# Patient Record
Sex: Male | Born: 1960
Health system: Southern US, Community
[De-identification: ages and names within clinical notes are randomized; demographics above are authoritative.]

## PROBLEM LIST (undated history)

## (undated) DIAGNOSIS — M549 Dorsalgia, unspecified: Secondary | ICD-10-CM

## (undated) DIAGNOSIS — J439 Emphysema, unspecified: Secondary | ICD-10-CM

## (undated) DIAGNOSIS — Z720 Tobacco use: Secondary | ICD-10-CM

## (undated) DIAGNOSIS — G8929 Other chronic pain: Secondary | ICD-10-CM

## (undated) HISTORY — PX: BACK SURGERY: SHX140

---

## 1998-09-23 ENCOUNTER — Encounter: Payer: Self-pay | Admitting: Emergency Medicine

## 1998-09-23 ENCOUNTER — Emergency Department (HOSPITAL_COMMUNITY): Admission: EM | Admit: 1998-09-23 | Discharge: 1998-09-23 | Payer: Self-pay | Admitting: Emergency Medicine

## 1999-04-10 ENCOUNTER — Emergency Department (HOSPITAL_COMMUNITY): Admission: EM | Admit: 1999-04-10 | Discharge: 1999-04-10 | Payer: Self-pay | Admitting: Emergency Medicine

## 1999-04-15 ENCOUNTER — Emergency Department (HOSPITAL_COMMUNITY): Admission: EM | Admit: 1999-04-15 | Discharge: 1999-04-15 | Payer: Self-pay | Admitting: Emergency Medicine

## 2000-11-25 ENCOUNTER — Encounter: Payer: Self-pay | Admitting: Family Medicine

## 2000-11-25 ENCOUNTER — Encounter: Admission: RE | Admit: 2000-11-25 | Discharge: 2000-11-25 | Payer: Self-pay

## 2004-12-26 ENCOUNTER — Emergency Department (HOSPITAL_COMMUNITY): Admission: EM | Admit: 2004-12-26 | Discharge: 2004-12-26 | Payer: Self-pay | Admitting: Family Medicine

## 2012-01-08 ENCOUNTER — Encounter (HOSPITAL_COMMUNITY): Payer: Self-pay

## 2012-01-08 ENCOUNTER — Emergency Department (HOSPITAL_COMMUNITY)
Admission: EM | Admit: 2012-01-08 | Discharge: 2012-01-09 | Disposition: A | Discharge status: Home Health | Payer: BC Managed Care – PPO | Attending: Emergency Medicine | Admitting: Emergency Medicine

## 2012-01-08 DIAGNOSIS — X58XXXA Exposure to other specified factors, initial encounter: Secondary | ICD-10-CM | POA: Insufficient documentation

## 2012-01-08 DIAGNOSIS — G8929 Other chronic pain: Secondary | ICD-10-CM | POA: Insufficient documentation

## 2012-01-08 DIAGNOSIS — F17200 Nicotine dependence, unspecified, uncomplicated: Secondary | ICD-10-CM | POA: Insufficient documentation

## 2012-01-08 DIAGNOSIS — S161XXA Strain of muscle, fascia and tendon at neck level, initial encounter: Secondary | ICD-10-CM

## 2012-01-08 DIAGNOSIS — M549 Dorsalgia, unspecified: Secondary | ICD-10-CM | POA: Insufficient documentation

## 2012-01-08 DIAGNOSIS — S139XXA Sprain of joints and ligaments of unspecified parts of neck, initial encounter: Secondary | ICD-10-CM | POA: Insufficient documentation

## 2012-01-08 HISTORY — DX: Other chronic pain: G89.29

## 2012-01-08 HISTORY — DX: Dorsalgia, unspecified: M54.9

## 2012-01-08 NOTE — ED Notes (Signed)
Pt reports intermittent posterior neck pain x8 months. Pt reports symptoms come and go. Pt reports he now is unable to move his head up/down or side to side starting 1900 this pm.

## 2012-01-09 ENCOUNTER — Emergency Department (HOSPITAL_COMMUNITY): Payer: BC Managed Care – PPO

## 2012-01-09 MED ORDER — HYDROMORPHONE HCL PF 1 MG/ML IJ SOLN
1.0000 mg | Freq: Once | INTRAMUSCULAR | Status: AC
  Administered 2012-01-09: 1 mg via INTRAMUSCULAR
  Filled 2012-01-09: qty 1

## 2012-01-09 MED ORDER — DIAZEPAM 5 MG PO TABS
5.0000 mg | ORAL_TABLET | Freq: Once | ORAL | Status: AC
  Administered 2012-01-09: 5 mg via ORAL
  Filled 2012-01-09: qty 1

## 2012-01-09 MED ORDER — KETOROLAC TROMETHAMINE 60 MG/2ML IM SOLN
60.0000 mg | Freq: Once | INTRAMUSCULAR | Status: AC
  Administered 2012-01-09: 60 mg via INTRAMUSCULAR
  Filled 2012-01-09: qty 2

## 2012-01-09 MED ORDER — DIAZEPAM 5 MG PO TABS: 5.0000 mg | ORAL_TABLET | Freq: Two times a day (BID) | ORAL | Status: DC

## 2012-01-09 MED ORDER — IBUPROFEN 800 MG PO TABS: 800.0000 mg | ORAL_TABLET | Freq: Three times a day (TID) | ORAL | Status: DC

## 2012-01-09 NOTE — ED Notes (Signed)
Pt discharged home in improved condition. 

## 2012-01-09 NOTE — ED Provider Notes (Signed)
Medical screening examination/treatment/procedure(s) were performed by non-physician practitioner and as supervising physician I was immediately available for consultation/collaboration.  Miron Marxen T Shatira Dobosz, MD 01/09/12 0745 

## 2012-01-09 NOTE — ED Provider Notes (Signed)
History     CSN: [id number]  Arrival date & time 01/08/12  2313   First MD Initiated Contact with Patient 01/09/12 0016      Chief Complaint  Patient presents with  . Neck Pain    (Consider location/radiation/quality/duration/timing/severity/associated sxs/prior treatment) HPI History provided by pt.   Pt has had intermittent pain in his posterior neck for the past 8 months.  Pain gradually worsened throughout the day yesterday and as of 7pm, he was unable to turn his head.  Pain does not radiate.  Associated w/ neck edema.  Denies fever, headache, extremity weakness/paresthesias, dizziness, vision changes, ataxia.  Has had mild improvement in pain w/ percocet that he takes for chronic low back pain.  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     Past Medical History  Diagnosis Date  . Chronic back pain     Past Surgical History  Procedure Date  . Back surgery     History reviewed. No pertinent family history.  History  Substance Use Topics  . Smoking status: Current Every Day Smoker  . Smokeless tobacco: Not on file  . Alcohol Use: Yes      Review of Systems  All other systems reviewed and are negative.    Allergies  Review of patient's allergies indicates no known allergies.  Home Medications   Current Outpatient Rx  Name Route Sig Dispense Refill  . OXYCODONE-ACETAMINOPHEN 10-325 MG PO TABS Oral Take 1 tablet by mouth every 6 (six) hours as needed. For pain    . VITAMIN C 500 MG PO TABS Oral Take 500 mg by mouth daily.      BP 157/96  Temp 98.3 F (36.8 C) (Oral)  Resp 18  SpO2 97%  Physical Exam  Nursing note and vitals reviewed. Constitutional: He is oriented to person, place, and time. He appears well-developed and well-nourished. No distress.  HENT:  Head: Normocephalic and atraumatic.  Eyes:       Normal appearance  Neck: Normal range of motion.  Pulmonary/Chest: Effort normal.  Musculoskeletal: Normal  range of motion.       No cervical spine tenderness.  Mild tenderness proximal bilateral trap.  Pain aggravated by passive ROM upper extremities and pt is unable to rotate head.  5/5 strength in all muscle groups upper extremities.  2+ radial pulses and distal sensation intact.  Ambulates w/out difficulty.     Neurological: He is alert and oriented to person, place, and time.  Psychiatric: He has a normal mood and affect. His behavior is normal.    ED Course  Procedures (including critical care time)  Labs Reviewed - No data to display Dg Cervical Spine Complete  01/09/2012  *RADIOLOGY REPORT*  Clinical Data: Neck pain for 8 months worsening tonight.  CERVICAL SPINE - COMPLETE 4+ VIEW  Comparison: None.  Findings: Vertebral body height and alignment are unremarkable. There is loss of disc space height worst at C5-6 and C6-7. Multilevel facet arthropathy is noted.  There is mild reversal of the normal cervical lordosis.  Prevertebral soft tissues appear normal.  Lung apices are clear.  IMPRESSION: No acute finding.  Degenerative disease most notable C5-6 and C6-7.   Original Report Authenticated By: Bernadene Bell. D'ALESSIO, M.D.      1. Cervical strain       MDM  51yo healthy M presents w/ acute on chronic neck pain.  No signs of infectious process and no NV deficits  on exam.  Suspect cervical strain.  Pt received IM dilaudid/toradol and po valium w/ relief of sx.  He is now able to rotate his head w/ ease.  Xray shows DJD.  Results discussed w/ pt.  Pt d/c'd home w/ valium and 800mg  ibuprofen.  Recommended heat and rest.  He has a neurosurgeon to follow up with.  Return precautions discussed.         Otilio Miu, [place] 01/09/12 [phone]

## 2013-06-02 ENCOUNTER — Other Ambulatory Visit: Payer: Self-pay | Admitting: Family Medicine

## 2013-06-02 ENCOUNTER — Ambulatory Visit
Admission: RE | Admit: 2013-06-02 | Discharge: 2013-06-02 | Disposition: A | Discharge status: Home / Self Care | Payer: BC Managed Care – PPO | Source: Ambulatory Visit | Attending: Family Medicine | Admitting: Family Medicine

## 2013-06-02 DIAGNOSIS — R109 Unspecified abdominal pain: Secondary | ICD-10-CM

## 2013-11-13 ENCOUNTER — Encounter: Payer: Self-pay | Admitting: Neurology

## 2013-11-13 ENCOUNTER — Ambulatory Visit (INDEPENDENT_AMBULATORY_CARE_PROVIDER_SITE_OTHER): Payer: BC Managed Care – PPO | Admitting: Neurology

## 2013-11-13 VITALS — BP 158/84 | HR 56 | Temp 97.1°F | Ht 69.0 in | Wt 134.0 lb

## 2013-11-13 DIAGNOSIS — R0609 Other forms of dyspnea: Secondary | ICD-10-CM

## 2013-11-13 DIAGNOSIS — G8929 Other chronic pain: Secondary | ICD-10-CM

## 2013-11-13 DIAGNOSIS — M549 Dorsalgia, unspecified: Secondary | ICD-10-CM

## 2013-11-13 DIAGNOSIS — R0683 Snoring: Secondary | ICD-10-CM

## 2013-11-13 DIAGNOSIS — R351 Nocturia: Secondary | ICD-10-CM

## 2013-11-13 DIAGNOSIS — F1120 Opioid dependence, uncomplicated: Secondary | ICD-10-CM

## 2013-11-13 DIAGNOSIS — R0989 Other specified symptoms and signs involving the circulatory and respiratory systems: Secondary | ICD-10-CM

## 2013-11-13 NOTE — Progress Notes (Signed)
Subjective:    Patient ID: Caleb Harper is a 53 y.o. male.  HPI    Star Age, MD, PhD [facility] [address] [place], [place] [address]  Dear Dr. Brien Few,   I saw your patient, Caleb Harper, upon your kind request in my neurologic clinic today for initial consultation of his sleep disorder, in particular, concern for underlying obstructive sleep disordered breathing. The patient is unaccompanied today. As you know, Caleb Harper with an underlying medical history of chronic low back pain, s/p 2 low back surgeries in the late 80s, on narcotic pain medication (Percocet qid, no later than 6 PM ), who reports snoring. He works in [employer].   His typical bedtime is reported to be around 9:30 PM and usual wake time is around 4 AM. Sleep onset typically occurs within 20 minutes. He reports feeling adequately rested upon awakening. He wakes up on an average 2 times in the middle of the night and has to go to the bathroom 2 times on a typical night. He denies morning headaches.  He denies frank excessive daytime somnolence (EDS) and His Epworth Sleepiness Score (ESS) is 8/24 today. He has not fallen asleep while driving. The patient has not been taking a scheduled nap.  He has been known to snore for the past few years. Snoring is reportedly mild, and he has not been told he has apneas. He sleeps alone and in the process of getting a divorce. He has no children and no pets. He drinks alcohol occasionally and is in the process of smoking reduction, now smoking about 8-10/day. The patient has not noted any RLS symptoms and is not known to kick while asleep or before falling asleep. There is no family history of RLS or OSA.  He denies cataplexy, sleep paralysis, hypnagogic or hypnopompic hallucinations, or sleep attacks. He does not report any vivid dreams, nightmares, dream enactments, or parasomnias, such as sleep talking  or sleep walking. The patient has not had a sleep study or a home sleep test.  He consumes 4 caffeinated beverages per day, usually in the form of 2 cups of coffee in the mornings, 2 sodas, and sometimes tea with dinner around 6 PM.  His bedroom is usually dark and cool. There is a TV in the bedroom and usually it is not on at night.   His Past Medical History Is Significant For: Past Medical History  Diagnosis Date  . Chronic back pain     His Past Surgical History Is Significant For: Past Surgical History  Procedure Laterality Date  . Back surgery      His Family History Is Significant For: Family History  Problem Relation Age of Onset  . Cancer Mother   . COPD Father     His Social History Is Significant For: History   Social History  . Marital Status: Single    Spouse Name: N/A    Number of Children: N/A  . Years of Education: N/A   Social History Main Topics  . Smoking status: Current Every Day Smoker  . Smokeless tobacco: None  . Alcohol Use: Yes  . Drug Use: No  . Sexual Activity:    Other Topics Concern  . None   Social History Narrative  . None    His Allergies Are:  No Known Allergies:   His Current Medications Are:  Outpatient Encounter Prescriptions as of 11/13/2013  Medication Sig  . oxyCODONE-acetaminophen (  PERCOCET) 10-325 MG per tablet Take 1 tablet by mouth every 6 (six) hours as needed. For pain  . ibuprofen (ADVIL,MOTRIN) 800 MG tablet Take 1 tablet (800 mg total) by mouth 3 (three) times daily.  . vitamin C (ASCORBIC ACID) 500 MG tablet Take 500 mg by mouth daily.  . [DISCONTINUED] diazepam (VALIUM) 5 MG tablet Take 1 tablet (5 mg total) by mouth 2 (two) times daily.  :  Review of Systems:  Out of a complete 14 point review of systems, all are reviewed and negative with the exception of these symptoms as listed below:   Review of Systems  Constitutional: Negative.   HENT: Negative.   Eyes: Negative.   Respiratory: Positive for cough.    Cardiovascular: Negative.   Gastrointestinal: Negative.   Endocrine: Negative.   Genitourinary: Negative.   Musculoskeletal: Negative.   Skin: Negative.   Allergic/Immunologic: Negative.   Neurological: Negative.   Hematological: Negative.   Psychiatric/Behavioral: Positive for sleep disturbance (snoring).    Objective:  Neurologic Exam  Physical Exam Physical Examination:   Filed Vitals:   11/13/13 1051  BP: 158/84  Pulse: 56  Temp: 97.1 F (36.2 C)    General Examination: The patient is a very pleasant 53 y.o. male in no acute distress. He appears well-developed and well-nourished and well groomed.   HEENT: Normocephalic, atraumatic, pupils are equal, round and reactive to light and accommodation. Funduscopic exam is normal with sharp disc margins noted. Extraocular tracking is good without limitation to gaze excursion or nystagmus noted. Normal smooth pursuit is noted. Hearing is grossly intact. Tympanic membranes are clear bilaterally. Face is symmetric with normal facial animation and normal facial sensation. Speech is clear with no dysarthria noted. There is no hypophonia. There is no lip, neck/head, jaw or voice tremor. Neck is supple with full range of passive and active motion. There are no carotid bruits on auscultation. Oropharynx exam reveals: mild mouth dryness, adequate dental hygiene and mild airway crowding, due to tonsils in place and mildly long tongue and uvula. Mallampati is class I. Tongue protrudes centrally and palate elevates symmetrically. Tonsils are 1+ in size. Neck size is 14 inches. He has a Mild overbite. Nasal inspection reveals no significant nasal mucosal bogginess or redness and no septal deviation.   Chest: Clear to auscultation without wheezing, rhonchi or crackles noted.  Heart: S1+S2+0, regular and normal without murmurs, rubs or gallops noted.   Abdomen: Soft, non-tender and non-distended with normal bowel sounds appreciated on  auscultation.  Extremities: There is no pitting edema in the distal lower extremities bilaterally. Pedal pulses are intact.  Skin: Warm and dry without trophic changes noted. There are no varicose veins.  Musculoskeletal: exam reveals no obvious joint deformities, tenderness or joint swelling or erythema.   Neurologically:  Mental status: The patient is awake, alert and oriented in all 4 spheres. His immediate and remote memory, attention, language skills and fund of knowledge are appropriate. There is no evidence of aphasia, agnosia, apraxia or anomia. Speech is clear with normal prosody and enunciation. Thought process is linear. Mood is normal and affect is normal.  Cranial nerves II - XII are as described above under HEENT exam. In addition: shoulder shrug is normal with equal shoulder height noted. Motor exam: Normal bulk, strength and tone is noted. There is no drift, tremor or rebound. Romberg is negative. Reflexes are 2+ throughout. Babinski: Toes are flexor bilaterally. Fine motor skills and coordination: intact with normal finger taps, normal hand movements, normal  rapid alternating patting, normal foot taps and normal foot agility.  Cerebellar testing: No dysmetria or intention tremor on finger to nose testing. Heel to shin is unremarkable bilaterally. There is no truncal or gait ataxia.  Sensory exam: intact to light touch, pinprick, vibration, temperature sense in the upper and lower extremities.  Gait, station and balance: He stands easily. No veering to one side is noted. No leaning to one side is noted. Posture is age-appropriate and stance is narrow based. Gait shows normal stride length and normal pace. No problems turning are noted. He turns en bloc. Tandem walk is unremarkable. Intact toe and heel stance is noted.                Assessment and Plan:   In summary, Caleb Harper is a very pleasant 53 y.o.-year old male with a history of chronic back pain on chronic narcotic pain  medication, who reports snoring, but otherwise with no telltale history or exam highly suspicous for OSA. Nevertheless, given his snoring and his narcotic pain medication use and nocturia reported, we should proceed with a sleep study. He would prefer a HST, which I will arrange and then see him back in follow up.  I had a long chat with the patient about my findings and the diagnosis of OSA and CSA, its prognosis and treatment options. We talked about medical treatments, surgical interventions and non-pharmacological approaches. I explained in particular the risks and ramifications of untreated moderate to severe OSA, especially with respect to developing cardiovascular disease down the Road, including congestive heart failure, difficult to treat hypertension, cardiac arrhythmias, or stroke. Patients on chronic narcotics sometimes have central apneas. I advised the patient not to drive when feeling sleepy.  I recommended the following at this time: home sleep study.  I answered all his questions today and the patient was in agreement. I would like to see him back after the sleep study is completed and encouraged him to call with any interim questions, concerns, problems or updates.   Thank you very much for allowing me to participate in the care of this nice patient. If I can be of any further assistance to you please do not hesitate to call me at [phone].  Sincerely,   Star Age, MD, PhD

## 2013-11-13 NOTE — Patient Instructions (Signed)
We will set up a home sleep test and take it from there. If you have obstructive or another form of sleep apnea, we will need to talk about treatment options.

## 2015-11-28 DIAGNOSIS — R030 Elevated blood-pressure reading, without diagnosis of hypertension: Secondary | ICD-10-CM | POA: Diagnosis not present

## 2015-11-28 DIAGNOSIS — J209 Acute bronchitis, unspecified: Secondary | ICD-10-CM | POA: Diagnosis not present

## 2016-05-15 DIAGNOSIS — R1013 Epigastric pain: Secondary | ICD-10-CM | POA: Diagnosis not present

## 2016-07-23 DIAGNOSIS — Z79899 Other long term (current) drug therapy: Secondary | ICD-10-CM | POA: Diagnosis not present

## 2016-07-23 DIAGNOSIS — M48061 Spinal stenosis, lumbar region without neurogenic claudication: Secondary | ICD-10-CM | POA: Diagnosis not present

## 2016-07-23 DIAGNOSIS — M5416 Radiculopathy, lumbar region: Secondary | ICD-10-CM | POA: Diagnosis not present

## 2016-07-23 DIAGNOSIS — M5126 Other intervertebral disc displacement, lumbar region: Secondary | ICD-10-CM | POA: Diagnosis not present

## 2016-09-26 DIAGNOSIS — M5416 Radiculopathy, lumbar region: Secondary | ICD-10-CM | POA: Diagnosis not present

## 2016-09-26 DIAGNOSIS — M48061 Spinal stenosis, lumbar region without neurogenic claudication: Secondary | ICD-10-CM | POA: Diagnosis not present

## 2016-09-26 DIAGNOSIS — R030 Elevated blood-pressure reading, without diagnosis of hypertension: Secondary | ICD-10-CM | POA: Diagnosis not present

## 2016-12-20 DIAGNOSIS — M5126 Other intervertebral disc displacement, lumbar region: Secondary | ICD-10-CM | POA: Diagnosis not present

## 2016-12-20 DIAGNOSIS — M5416 Radiculopathy, lumbar region: Secondary | ICD-10-CM | POA: Diagnosis not present

## 2016-12-20 DIAGNOSIS — M48061 Spinal stenosis, lumbar region without neurogenic claudication: Secondary | ICD-10-CM | POA: Diagnosis not present

## 2016-12-20 DIAGNOSIS — Z79899 Other long term (current) drug therapy: Secondary | ICD-10-CM | POA: Diagnosis not present

## 2017-01-07 ENCOUNTER — Other Ambulatory Visit: Payer: Self-pay | Admitting: Physical Medicine and Rehabilitation

## 2017-01-07 DIAGNOSIS — M48061 Spinal stenosis, lumbar region without neurogenic claudication: Secondary | ICD-10-CM

## 2017-01-19 ENCOUNTER — Ambulatory Visit
Admission: RE | Admit: 2017-01-19 | Discharge: 2017-01-19 | Disposition: A | Discharge status: Home / Self Care | Payer: BLUE CROSS/BLUE SHIELD | Source: Ambulatory Visit | Attending: Physical Medicine and Rehabilitation | Admitting: Physical Medicine and Rehabilitation

## 2017-01-19 DIAGNOSIS — M48061 Spinal stenosis, lumbar region without neurogenic claudication: Secondary | ICD-10-CM

## 2017-02-04 DIAGNOSIS — J209 Acute bronchitis, unspecified: Secondary | ICD-10-CM | POA: Diagnosis not present

## 2017-02-08 DIAGNOSIS — M4726 Other spondylosis with radiculopathy, lumbar region: Secondary | ICD-10-CM | POA: Diagnosis not present

## 2017-02-08 DIAGNOSIS — M48062 Spinal stenosis, lumbar region with neurogenic claudication: Secondary | ICD-10-CM | POA: Diagnosis not present

## 2017-02-08 DIAGNOSIS — M4727 Other spondylosis with radiculopathy, lumbosacral region: Secondary | ICD-10-CM | POA: Diagnosis not present

## 2017-02-08 DIAGNOSIS — M5116 Intervertebral disc disorders with radiculopathy, lumbar region: Secondary | ICD-10-CM | POA: Diagnosis not present

## 2017-02-08 DIAGNOSIS — M48061 Spinal stenosis, lumbar region without neurogenic claudication: Secondary | ICD-10-CM | POA: Diagnosis not present

## 2017-02-20 DIAGNOSIS — M48061 Spinal stenosis, lumbar region without neurogenic claudication: Secondary | ICD-10-CM | POA: Diagnosis not present

## 2017-02-20 DIAGNOSIS — M5416 Radiculopathy, lumbar region: Secondary | ICD-10-CM | POA: Diagnosis not present

## 2017-03-21 DIAGNOSIS — M5416 Radiculopathy, lumbar region: Secondary | ICD-10-CM | POA: Diagnosis not present

## 2017-03-21 DIAGNOSIS — M48061 Spinal stenosis, lumbar region without neurogenic claudication: Secondary | ICD-10-CM | POA: Diagnosis not present

## 2017-03-21 DIAGNOSIS — Z79899 Other long term (current) drug therapy: Secondary | ICD-10-CM | POA: Diagnosis not present

## 2017-03-21 DIAGNOSIS — F1120 Opioid dependence, uncomplicated: Secondary | ICD-10-CM | POA: Diagnosis not present

## 2017-06-17 DIAGNOSIS — M5416 Radiculopathy, lumbar region: Secondary | ICD-10-CM | POA: Diagnosis not present

## 2017-06-17 DIAGNOSIS — F1120 Opioid dependence, uncomplicated: Secondary | ICD-10-CM | POA: Diagnosis not present

## 2017-06-17 DIAGNOSIS — Z79899 Other long term (current) drug therapy: Secondary | ICD-10-CM | POA: Diagnosis not present

## 2017-06-17 DIAGNOSIS — M5126 Other intervertebral disc displacement, lumbar region: Secondary | ICD-10-CM | POA: Diagnosis not present

## 2017-07-22 ENCOUNTER — Ambulatory Visit
Admission: RE | Admit: 2017-07-22 | Discharge: 2017-07-22 | Disposition: A | Discharge status: Home / Self Care | Payer: BLUE CROSS/BLUE SHIELD | Source: Ambulatory Visit | Attending: Family Medicine | Admitting: Family Medicine

## 2017-07-22 ENCOUNTER — Other Ambulatory Visit: Payer: Self-pay | Admitting: Family Medicine

## 2017-07-22 DIAGNOSIS — R042 Hemoptysis: Secondary | ICD-10-CM

## 2017-09-12 DIAGNOSIS — M5416 Radiculopathy, lumbar region: Secondary | ICD-10-CM | POA: Diagnosis not present

## 2017-09-12 DIAGNOSIS — M5126 Other intervertebral disc displacement, lumbar region: Secondary | ICD-10-CM | POA: Diagnosis not present

## 2017-09-12 DIAGNOSIS — M48062 Spinal stenosis, lumbar region with neurogenic claudication: Secondary | ICD-10-CM | POA: Diagnosis not present

## 2017-09-12 DIAGNOSIS — Z79899 Other long term (current) drug therapy: Secondary | ICD-10-CM | POA: Diagnosis not present

## 2017-12-26 DIAGNOSIS — Z79899 Other long term (current) drug therapy: Secondary | ICD-10-CM | POA: Diagnosis not present

## 2017-12-26 DIAGNOSIS — M48061 Spinal stenosis, lumbar region without neurogenic claudication: Secondary | ICD-10-CM | POA: Diagnosis not present

## 2017-12-26 DIAGNOSIS — M5416 Radiculopathy, lumbar region: Secondary | ICD-10-CM | POA: Diagnosis not present

## 2017-12-26 DIAGNOSIS — M48062 Spinal stenosis, lumbar region with neurogenic claudication: Secondary | ICD-10-CM | POA: Diagnosis not present

## 2018-01-29 DIAGNOSIS — M5416 Radiculopathy, lumbar region: Secondary | ICD-10-CM | POA: Diagnosis not present

## 2018-02-04 DIAGNOSIS — J189 Pneumonia, unspecified organism: Secondary | ICD-10-CM | POA: Diagnosis not present

## 2018-02-11 DIAGNOSIS — I499 Cardiac arrhythmia, unspecified: Secondary | ICD-10-CM | POA: Diagnosis not present

## 2018-02-11 DIAGNOSIS — J189 Pneumonia, unspecified organism: Secondary | ICD-10-CM | POA: Diagnosis not present

## 2018-02-25 ENCOUNTER — Ambulatory Visit
Admission: RE | Admit: 2018-02-25 | Discharge: 2018-02-25 | Disposition: A | Discharge status: Home / Self Care | Payer: BLUE CROSS/BLUE SHIELD | Source: Ambulatory Visit | Attending: Family Medicine | Admitting: Family Medicine

## 2018-02-25 ENCOUNTER — Other Ambulatory Visit: Payer: Self-pay | Admitting: Family Medicine

## 2018-02-25 DIAGNOSIS — R05 Cough: Secondary | ICD-10-CM | POA: Diagnosis not present

## 2018-02-25 DIAGNOSIS — J069 Acute upper respiratory infection, unspecified: Secondary | ICD-10-CM

## 2018-03-12 ENCOUNTER — Other Ambulatory Visit: Payer: Self-pay | Admitting: Family Medicine

## 2018-03-12 DIAGNOSIS — J189 Pneumonia, unspecified organism: Secondary | ICD-10-CM

## 2018-03-18 ENCOUNTER — Ambulatory Visit
Admission: RE | Admit: 2018-03-18 | Discharge: 2018-03-18 | Disposition: A | Discharge status: Home / Self Care | Payer: BLUE CROSS/BLUE SHIELD | Source: Ambulatory Visit | Attending: Family Medicine | Admitting: Family Medicine

## 2018-03-18 DIAGNOSIS — J189 Pneumonia, unspecified organism: Secondary | ICD-10-CM | POA: Diagnosis not present

## 2018-03-18 MED ORDER — IOHEXOL 300 MG/ML  SOLN
75.0000 mL | Freq: Once | INTRAMUSCULAR | Status: AC | PRN
  Administered 2018-03-18: 75 mL via INTRAVENOUS

## 2018-03-19 ENCOUNTER — Encounter (HOSPITAL_COMMUNITY): Payer: Self-pay

## 2018-03-19 ENCOUNTER — Inpatient Hospital Stay (HOSPITAL_COMMUNITY)
Admission: EM | Admit: 2018-03-19 | Discharge: 2018-03-22 | DRG: 175 | Disposition: A | Discharge status: Home / Self Care | Payer: BLUE CROSS/BLUE SHIELD | Attending: Internal Medicine | Admitting: Internal Medicine

## 2018-03-19 ENCOUNTER — Other Ambulatory Visit: Payer: Self-pay

## 2018-03-19 DIAGNOSIS — I2693 Single subsegmental pulmonary embolism without acute cor pulmonale: Secondary | ICD-10-CM | POA: Diagnosis not present

## 2018-03-19 DIAGNOSIS — I5022 Chronic systolic (congestive) heart failure: Secondary | ICD-10-CM | POA: Diagnosis present

## 2018-03-19 DIAGNOSIS — I2699 Other pulmonary embolism without acute cor pulmonale: Secondary | ICD-10-CM | POA: Diagnosis not present

## 2018-03-19 DIAGNOSIS — R000 Tachycardia, unspecified: Secondary | ICD-10-CM | POA: Diagnosis not present

## 2018-03-19 DIAGNOSIS — M549 Dorsalgia, unspecified: Secondary | ICD-10-CM | POA: Diagnosis not present

## 2018-03-19 DIAGNOSIS — G8929 Other chronic pain: Secondary | ICD-10-CM | POA: Diagnosis not present

## 2018-03-19 DIAGNOSIS — J9601 Acute respiratory failure with hypoxia: Secondary | ICD-10-CM | POA: Diagnosis present

## 2018-03-19 DIAGNOSIS — J449 Chronic obstructive pulmonary disease, unspecified: Secondary | ICD-10-CM | POA: Diagnosis present

## 2018-03-19 DIAGNOSIS — R918 Other nonspecific abnormal finding of lung field: Secondary | ICD-10-CM | POA: Diagnosis not present

## 2018-03-19 DIAGNOSIS — M7989 Other specified soft tissue disorders: Secondary | ICD-10-CM | POA: Diagnosis not present

## 2018-03-19 DIAGNOSIS — F17210 Nicotine dependence, cigarettes, uncomplicated: Secondary | ICD-10-CM | POA: Diagnosis present

## 2018-03-19 DIAGNOSIS — R634 Abnormal weight loss: Secondary | ICD-10-CM | POA: Diagnosis not present

## 2018-03-19 DIAGNOSIS — I429 Cardiomyopathy, unspecified: Secondary | ICD-10-CM | POA: Diagnosis not present

## 2018-03-19 DIAGNOSIS — Z720 Tobacco use: Secondary | ICD-10-CM | POA: Diagnosis not present

## 2018-03-19 DIAGNOSIS — K219 Gastro-esophageal reflux disease without esophagitis: Secondary | ICD-10-CM | POA: Diagnosis not present

## 2018-03-19 DIAGNOSIS — J690 Pneumonitis due to inhalation of food and vomit: Secondary | ICD-10-CM | POA: Diagnosis not present

## 2018-03-19 DIAGNOSIS — M545 Low back pain: Secondary | ICD-10-CM | POA: Diagnosis not present

## 2018-03-19 DIAGNOSIS — I361 Nonrheumatic tricuspid (valve) insufficiency: Secondary | ICD-10-CM | POA: Diagnosis not present

## 2018-03-19 DIAGNOSIS — R911 Solitary pulmonary nodule: Secondary | ICD-10-CM | POA: Diagnosis not present

## 2018-03-19 HISTORY — DX: Emphysema, unspecified: J43.9

## 2018-03-19 HISTORY — DX: Tobacco use: Z72.0

## 2018-03-19 LAB — COMPREHENSIVE METABOLIC PANEL
ALK PHOS: 130 U/L — AB (ref 38–126)
ALT: 13 U/L (ref 0–44)
AST: 19 U/L (ref 15–41)
Albumin: 3.5 g/dL (ref 3.5–5.0)
Anion gap: 11 (ref 5–15)
BILIRUBIN TOTAL: 0.6 mg/dL (ref 0.3–1.2)
BUN: 12 mg/dL (ref 6–20)
CALCIUM: 9.8 mg/dL (ref 8.9–10.3)
CO2: 23 mmol/L (ref 22–32)
CREATININE: 1.03 mg/dL (ref 0.61–1.24)
Chloride: 102 mmol/L (ref 98–111)
GFR calc Af Amer: >60 mL/min (ref >60)
Glucose, Bld: 115 mg/dL — ABNORMAL HIGH (ref 70–99)
Potassium: 3.9 mmol/L (ref 3.5–5.1)
Sodium: 136 mmol/L (ref 135–145)
TOTAL PROTEIN: 7.6 g/dL (ref 6.5–8.1)

## 2018-03-19 LAB — CBC WITH DIFFERENTIAL/PLATELET
Abs Immature Granulocytes: 0.27 10*3/uL — ABNORMAL HIGH (ref 0.00–0.07)
Basophils Absolute: 0.1 10*3/uL (ref 0.0–0.1)
Basophils Relative: 1 %
EOS PCT: 0 %
Eosinophils Absolute: 0.1 10*3/uL (ref 0.0–0.5)
HEMATOCRIT: 46.2 % (ref 39.0–52.0)
HEMOGLOBIN: 14.7 g/dL (ref 13.0–17.0)
IMMATURE GRANULOCYTES: 2 %
LYMPHS ABS: 2.9 10*3/uL (ref 0.7–4.0)
Lymphocytes Relative: 19 %
MCH: 29.8 pg (ref 26.0–34.0)
MCHC: 31.8 g/dL (ref 30.0–36.0)
MCV: 93.5 fL (ref 80.0–100.0)
MONOS PCT: 9 %
Monocytes Absolute: 1.4 10*3/uL — ABNORMAL HIGH (ref 0.1–1.0)
NEUTROS PCT: 69 %
Neutro Abs: 10.2 10*3/uL — ABNORMAL HIGH (ref 1.7–7.7)
Platelets: 325 10*3/uL (ref 150–400)
RBC: 4.94 MIL/uL (ref 4.22–5.81)
RDW: 12.4 % (ref 11.5–15.5)
WBC: 15 10*3/uL — ABNORMAL HIGH (ref 4.0–10.5)
nRBC: 0 % (ref 0.0–0.2)

## 2018-03-19 LAB — HEPARIN LEVEL (UNFRACTIONATED): Heparin Unfractionated: 0.57 IU/mL (ref 0.30–0.70)

## 2018-03-19 LAB — TROPONIN I: Troponin I: <0.03 ng/mL (ref <0.03)

## 2018-03-19 LAB — BRAIN NATRIURETIC PEPTIDE: B NATRIURETIC PEPTIDE 5: 190.9 pg/mL — AB (ref 0.0–100.0)

## 2018-03-19 LAB — PROCALCITONIN: Procalcitonin: <0.1 ng/mL

## 2018-03-19 MED ORDER — ACETAMINOPHEN 325 MG PO TABS
650.0000 mg | ORAL_TABLET | Freq: Four times a day (QID) | ORAL | Status: DC | PRN
  Administered 2018-03-20: 650 mg via ORAL
  Filled 2018-03-19: qty 2

## 2018-03-19 MED ORDER — POLYETHYLENE GLYCOL 3350 17 G PO PACK: 17.0000 g | PACK | Freq: Every day | ORAL | Status: DC | PRN

## 2018-03-19 MED ORDER — OXYCODONE-ACETAMINOPHEN 10-325 MG PO TABS: 1.0000 | ORAL_TABLET | Freq: Four times a day (QID) | ORAL | Status: DC | PRN

## 2018-03-19 MED ORDER — ONDANSETRON HCL 4 MG PO TABS: 4.0000 mg | ORAL_TABLET | Freq: Four times a day (QID) | ORAL | Status: DC | PRN

## 2018-03-19 MED ORDER — VITAMIN C 500 MG PO TABS
500.0000 mg | ORAL_TABLET | Freq: Every day | ORAL | Status: DC
  Administered 2018-03-19 – 2018-03-22 (×4): 500 mg via ORAL
  Filled 2018-03-19 (×4): qty 1

## 2018-03-19 MED ORDER — HYDRALAZINE HCL 20 MG/ML IJ SOLN: 5.0000 mg | Freq: Four times a day (QID) | INTRAMUSCULAR | Status: DC | PRN

## 2018-03-19 MED ORDER — HEPARIN BOLUS VIA INFUSION
3500.0000 [IU] | Freq: Once | INTRAVENOUS | Status: AC
  Administered 2018-03-19: 3500 [IU] via INTRAVENOUS
  Filled 2018-03-19: qty 3500

## 2018-03-19 MED ORDER — OXYCODONE HCL 5 MG PO TABS
5.0000 mg | ORAL_TABLET | ORAL | Status: DC | PRN
  Administered 2018-03-19 – 2018-03-22 (×5): 5 mg via ORAL
  Filled 2018-03-19 (×6): qty 1

## 2018-03-19 MED ORDER — PANTOPRAZOLE SODIUM 40 MG PO TBEC
40.0000 mg | DELAYED_RELEASE_TABLET | Freq: Every day | ORAL | Status: DC
  Administered 2018-03-19 – 2018-03-22 (×4): 40 mg via ORAL
  Filled 2018-03-19 (×4): qty 1

## 2018-03-19 MED ORDER — METOPROLOL TARTRATE 25 MG PO TABS
25.0000 mg | ORAL_TABLET | Freq: Two times a day (BID) | ORAL | Status: DC
  Administered 2018-03-20 – 2018-03-21 (×2): 25 mg via ORAL
  Filled 2018-03-19 (×3): qty 1

## 2018-03-19 MED ORDER — HEPARIN (PORCINE) 25000 UT/250ML-% IV SOLN
1000.0000 [IU]/h | INTRAVENOUS | Status: DC
  Administered 2018-03-19 – 2018-03-21 (×3): 1000 [IU]/h via INTRAVENOUS
  Filled 2018-03-19 (×2): qty 250

## 2018-03-19 MED ORDER — ADULT MULTIVITAMIN W/MINERALS CH
1.0000 | ORAL_TABLET | Freq: Every day | ORAL | Status: DC
  Administered 2018-03-19 – 2018-03-22 (×4): 1 via ORAL
  Filled 2018-03-19 (×4): qty 1

## 2018-03-19 MED ORDER — ONDANSETRON HCL 4 MG/2ML IJ SOLN: 4.0000 mg | Freq: Four times a day (QID) | INTRAMUSCULAR | Status: DC | PRN

## 2018-03-19 MED ORDER — IPRATROPIUM-ALBUTEROL 0.5-2.5 (3) MG/3ML IN SOLN: 3.0000 mL | Freq: Four times a day (QID) | RESPIRATORY_TRACT | Status: DC | PRN

## 2018-03-19 MED ORDER — ACETAMINOPHEN 650 MG RE SUPP: 650.0000 mg | Freq: Four times a day (QID) | RECTAL | Status: DC | PRN

## 2018-03-19 NOTE — H&P (Signed)
History and Physical    DOA: 03/19/2018  PCP: [name], L.Marlou Sa, MD  Patient coming from: Home  Chief Complaint: Persistent cough and shortness of breath  HPI: Caleb Harper is a 57 y.o. male with no significant past medical history except for ruptured disc/chronic back pain for which he follows pain clinic and has been on Percocet 5 325 mg 4-5 times a day presents with persistent cough and shortness of breath since October.  Patient reports symptom onset in early October primarily experiencing dyspnea with dry cough for which he sought medical attention with his primary care doctor.  He was prescribed levofloxacin for 5 days for right lower lobe pneumonia with no improvement in symptoms.  He states dyspnea persisted and he also was producing clear phlegm.  PCP again prescribed doxycycline and prednisone for possible COPD exacerbation on October 22 with no improvement.  He was again evaluated on November 6 with a repeat chest x-ray showing persistent right lower lobe infiltrate for which he was prescribed Augmentin for 10 days. Patient presents today with complaints of worsening dyspnea, persistent cough, right-sided pleuritic chest pain and about 10 to 12 pounds weight loss over the last 2 months.  He denies hemoptysis or night sweats.  He denies any sick contacts.  He denies any nausea or vomiting.  He says his last cigarette was 3 weeks back.  He has been a smoker (one pack per day) for 30 years.  Occasionally drinks alcohol, denies drug abuse. Work-up in the ED revealed leukocytosis of 15,000, CT chest findings of subacute PE with multifocal infiltrates and right-sided pulmonary infarct.  Patient denies any long travels or sedentary state or family/personal history of blood clots. Per ED physician, O2 2 L was placed for comfort as patient appeared tachypneic talking full sentences on arrival with O2 sat 92% on room air.  Review of Systems: As per HPI otherwise 10 point review of systems negative.      Past Medical History:  Diagnosis Date  . Chronic back pain     Past Surgical History:  Procedure Laterality Date  . BACK SURGERY      Social history:  reports that he has been smoking. He does not have any smokeless tobacco history on file. He reports that he drinks alcohol. He reports that he does not use drugs.   No Known Allergies  Family History  Problem Relation Age of Onset  . Cancer Mother   . COPD Father       Prior to Admission medications   Medication Sig Start Date End Date Taking? Authorizing Provider  oxyCODONE-acetaminophen (PERCOCET/ROXICET) 5-325 MG tablet Take 1-2 tablets by mouth every 6 (six) hours as needed for moderate pain.    Yes [provider]  ibuprofen (ADVIL,MOTRIN) 800 MG tablet Take 1 tablet (800 mg total) by mouth 3 (three) times daily. Patient not taking: Reported on 03/19/2018 01/09/12   Gertha Calkin, PA-C    Physical Exam: Vitals:   03/19/18 1030 03/19/18 1100 03/19/18 1115 03/19/18 1130  BP: (!) 147/100 (!) 144/94 (!) 155/96 (!) 112/96  Pulse: 64 79 70 72  Resp: (!) 21 20 (!) 23 20  SpO2: 96% 96% 94% 95%  Weight:      Height:        Constitutional: NAD, calm, comfortable Vitals:   03/19/18 1030 03/19/18 1100 03/19/18 1115 03/19/18 1130  BP: (!) 147/100 (!) 144/94 (!) 155/96 (!) 112/96  Pulse: 64 79 70 72  Resp: (!) 21 20 (!) 23 20  SpO2: 96% 96% 94% 95%  Weight:      Height:       Eyes: PERRL, lids and conjunctivae normal ENMT: Mucous membranes are moist. Posterior pharynx clear of any exudate or lesions.Normal dentition.  Neck: normal, supple, no masses, no thyromegaly Respiratory: clear to auscultation bilaterally, no wheezing, no crackles. Normal respiratory effort. No accessory muscle use.  On 2 L nasal cannula Cardiovascular: Regular rate and rhythm, no murmurs / rubs / gallops. No extremity edema. 2+ pedal pulses. No carotid bruits.  Abdomen: no tenderness, no masses palpated. No  hepatosplenomegaly. Bowel sounds positive.  Musculoskeletal: no clubbing / cyanosis. No joint deformity upper and lower extremities. Good ROM, no contractures. Normal muscle tone.  Neurologic: CN 2-12 grossly intact. Sensation intact, DTR normal. Strength 5/5 in all 4.  Psychiatric: Normal judgment and insight. Alert and oriented x 3. Normal mood.  SKIN/catheters: no rashes, lesions, ulcers. No induration  Labs on Admission: I have personally reviewed following labs and imaging studies  CBC: Recent Labs  Lab 03/19/18 0946  WBC 15.0*  NEUTROABS 10.2*  HGB 14.7  HCT 46.2  MCV 93.5  PLT 366   Basic Metabolic Panel: Recent Labs  Lab 03/19/18 0946  NA 136  K 3.9  CL 102  CO2 23  GLUCOSE 115*  BUN 12  CREATININE 1.03  CALCIUM 9.8   GFR: Estimated Creatinine Clearance: 64.2 mL/min (by C-G formula based on SCr of 1.03 mg/dL). Liver Function Tests: Recent Labs  Lab 03/19/18 0946  AST 19  ALT 13  ALKPHOS 130*  BILITOT 0.6  PROT 7.6  ALBUMIN 3.5   No results for input(s): LIPASE, AMYLASE in the last 168 hours. No results for input(s): AMMONIA in the last 168 hours. Coagulation Profile: No results for input(s): INR, PROTIME in the last 168 hours. Cardiac Enzymes: Recent Labs  Lab 03/19/18 0946  TROPONINI <0.03   BNP (last 3 results) No results for input(s): PROBNP in the last 8760 hours. HbA1C: No results for input(s): HGBA1C in the last 72 hours. CBG: No results for input(s): GLUCAP in the last 168 hours. Lipid Profile: No results for input(s): CHOL, HDL, LDLCALC, TRIG, CHOLHDL, LDLDIRECT in the last 72 hours. Thyroid Function Tests: No results for input(s): TSH, T4TOTAL, FREET4, T3FREE, THYROIDAB in the last 72 hours. Anemia Panel: No results for input(s): VITAMINB12, FOLATE, FERRITIN, TIBC, IRON, RETICCTPCT in the last 72 hours. Urine analysis: No results found for: COLORURINE, APPEARANCEUR, LABSPEC, PHURINE, GLUCOSEU, HGBUR, BILIRUBINUR, KETONESUR,  PROTEINUR, UROBILINOGEN, NITRITE, LEUKOCYTESUR  Radiological Exams on Admission: Ct Chest W Contrast  Result Date: 03/19/2018 CLINICAL DATA:  57 year old male with a history of persisting pneumonia for 2 months EXAM: CT CHEST WITH CONTRAST TECHNIQUE: Multidetector CT imaging of the chest was performed during intravenous contrast administration. CONTRAST:  27mL OMNIPAQUE IOHEXOL 300 MG/ML  SOLN COMPARISON:  Chest x-ray 02/25/2018 07/22/2017 FINDINGS: Cardiovascular: Heart size within normal limits. The right ventricle/left ventricle ratio estimated less than 1.0. No pericardial fluid/thickening. No significant coronary calcifications. No aortic valve calcifications. Normal course caliber and contour of the thoracic aorta. Mild atherosclerotic changes. Branch vessels are patent. No periaortic fluid. Main pulmonary artery measures 2.9 cm. Filling defects within the segmental branches and subsegmental branches of the right lower lobe. Filling defects are mural and not central, with evidence of partial recanalization. There is associated hypoenhancing lung parenchyma at the right lung base. On the left no filling defects identified, however, there are regions of hypo attenuating/enhancing parenchyma at the left lung base.  Mediastinum/Nodes: Borderline enlarged lymph nodes of the mediastinum, predominantly of the lower paratracheal nodes and hilar nodes as well as subcarinal nodes. Index node on the right measures 13 mm at the hilum. AP window node measures 9 mm. Circumferential wall thickening of the distal esophagus. No endotracheal or central endobronchial debris. Unremarkable appearance of the thoracic inlet. Lungs/Pleura: Advanced centrilobular and paraseptal emphysema. Mixed consolidation and ground-glass opacity of the bilateral dependent lower lobes, more pronounced on the right complete collapse of the medial segments/posterior segment. Regions of hypoenhancement compatible with infarction. On the left  there are regions of hypoenhancement in the costophrenic sulcus posteriorly. Multiple nodules of the left upper lobe and right upper lobe, in the outer 1/3 of the lung. These nodular changes were not present on the x-ray of 07/22/2017. No pleural effusion. Upper Abdomen: No acute finding Musculoskeletal: No acute displaced fracture. Mild degenerative changes of the thoracic spine. IMPRESSION: CT demonstrates evidence multifocal infection as well as pulmonary infarction in the setting of subacute/developing chronic pulmonary embolism: -the nodular and ground-glass changes of the lungs, predominantly right lower lobe, favored to represent multifocal infection given that these changes were not present on the chest x-ray of July 22, 2017. Because rapidly evolving metastatic disease is not excluded, follow-up contrast-enhanced chest CT is recommended once the patient has been treated in 2-3 months. -superimposed pulmonary infarction of the right greater than left lower lobes with associated atelectasis/volume loss. Mural based pulmonary embolism of the right lower lobe segmental/subsegmental branches with partial recanalization, compatible with subacute to early chronic DVT. Circumferential distal esophageal thickening. This may be seen in the setting of longstanding GERD. Correlation with symptoms as well as potentially referral for upper endoscopy may be useful. Advanced emphysema.  Emphysema (ICD10-J43.9). Aortic Atherosclerosis (ICD10-I70.0). These results will be called to the ordering clinician or representative by the Radiologist Assistant, and communication documented in the PACS or zVision Dashboard. Electronically Signed   By: Corrie Mckusick D.O.   On: 03/19/2018 08:13    EKG: Independently reviewed.  Sinus tachycardia with PVCs and signs of atrial enlargement.     Assessment and Plan:   1.  Right lower lobe segmental/subsegmental pulmonary embolus: Patient started on heparin drip in the ED.  Will  continue the same.  Patient denies any precipitating factors like long travel or family or personal history of clots.  Will need hypercoagulable work-up including malignancy work-up at some point.  Obtain echo.  May have ventricular strain as BNP slightly elevated.  2.  Pulmonary infarct/multifocal infiltrates: Secondary to aspiration in the setting of chronic opiates and reflux (distal esophageal thickening reported on CT) versus malignancy.  Consulted pulmonary for further evaluation and recommendations, possibly bronch.  Incentive spirometry for atelectasis ordered.  PPI.  Although patient has leukocytosis, doubt infection as he completed 3 courses of antibiotics as outpatient including Levaquin, Augmentin and doxycycline.  Procalcitonin less than 0.1.  3.  Tobacco use/emphysema: Patient completed prednisone course in the last week of October.  No wheezing on my exam.  Will order nebulizer treatments for now.  Counseled regarding risks of tobacco use and advised to quit.  Patient stated he is already in the process to quit and his last cigarette was 3 weeks back.  4.  Chronic back pain: Resume chronic opiates  DVT prophylaxis: On heparin  Code Status: Full code  Family Communication: Discussed with patient. Health care proxy would be his brother Artyom Stencel Consults called: Pulmonary Admission status:  Patient admitted as inpatient as anticipated LOS  greater than 2 midnights    Guilford Shi MD [facility] Pager [phone]  If 7PM-7AM, please contact night-coverage [email or web address] Password [email or web address]  03/19/2018, 1:19 PM

## 2018-03-19 NOTE — ED Triage Notes (Signed)
Pt c/o SOB for about 2 months. Pt endorses chest pain. Pt had CT scan yesterday and MD office called and said he had a PE. Pt a&ox4.

## 2018-03-19 NOTE — Progress Notes (Signed)
ANTICOAGULATION CONSULT NOTE  Pharmacy Consult:  Heparin Indication: pulmonary embolus  No Known Allergies  Patient Measurements: Height: 5\' 9"  (175.3 cm) Weight: 125 lb (56.7 kg) IBW/kg (Calculated) : 70.7 Heparin Dosing Weight: 57 kg  Vital Signs: Temp: 98.1 F (36.7 C) (11/27 1530) BP: 157/98 (11/27 1530) Pulse Rate: 70 (11/27 1530)  Labs: Recent Labs    03/19/18 0946 03/19/18 1630  HGB 14.7  --   HCT 46.2  --   PLT 325  --   HEPARINUNFRC  --  0.57  CREATININE 1.03  --   TROPONINI <0.03  --     Estimated Creatinine Clearance: 64.2 mL/min (by C-G formula based on SCr of 1.03 mg/dL).    Assessment: 87 YOM presenting with CP/SOB, PE on CT and Pharmacy consulted to start heparin.  No AC PTA.  Heparin level is therapeutic; no bleeding reported.   Goal of Therapy:  Heparin level 0.3-0.7 units/ml Monitor platelets by anticoagulation protocol: Yes    Plan:  Continue heparin gtt at 1000 units/hr Check confirmatory heparin level   Ryker Pherigo D. Mina Marble, PharmD, BCPS, [place] 03/19/2018, 5:16 PM

## 2018-03-19 NOTE — Progress Notes (Signed)
Caleb Harper is a 57 y.o. male patient admitted from ED awake, alert - oriented  X 4 - no acute distress noted. IV in place, occlusive dsg intact without redness.  Orientation to room, and floor completed with information packet given to patient/family.  Patient declined safety video at this time.  Admission INP armband ID verified with patient/family, and in place.   SR up x 2, fall assessment complete, with patient and family able to verbalize understanding of risk associated with falls, and verbalized understanding to call nsg before up out of bed.  Call light within reach, patient able to voice, and demonstrate understanding.  Skin, clean-dry- intact without evidence of bruising, or skin tears.   No evidence of skin break down noted on exam.     Will cont to eval and treat per MD orders.  Tama High, RN 03/19/2018 3:43 PM

## 2018-03-19 NOTE — ED Provider Notes (Signed)
Emergency Department Provider Note   I have reviewed the triage vital signs and the nursing notes.   HISTORY  Chief Complaint Shortness of Breath   HPI Caleb Harper is a 57 y.o. male with animal medical problems but a significant history of smoking who presents the emergency department today with a known blood clot.  Sounds like the patient has had some increased fatigue, decreased appetite weight loss for last 2 to 3 months.  In the middle of October he went to urgent care because he had shortness of breath with a nonproductive cough no fever.  He was diagnosed with pneumonia started on antibiotics.  He followed up with his primary doctors and has been through 3 total rounds of antibiotics without any improvement.  He has significant dyspnea on exertion and intermittent chest pain.  No lower extremity or upper extremity swelling.  No recent injuries.  No recent surgeries.  No recent long trips or immobilization.  He saw his physician again yesterday and secondary to not improving with antibiotics did a CT scan which found a subacute to chronic pulmonary embolus but CT scan on my review also shows diffuse interstitial markings that were not present in April but were mentioned at the beginning of November prior to a couple rounds of antibiotics. No other associated or modifying symptoms.    Past Medical History:  Diagnosis Date  . Chronic back pain     There are no active problems to display for this patient.   Past Surgical History:  Procedure Laterality Date  . BACK SURGERY      Current Outpatient Rx  . Order #: [id number] Class: Print  . Order #: [id number] Class: Historical Med  . Order #: [id number] Class: Historical Med    Allergies Patient has no known allergies.  Family History  Problem Relation Age of Onset  . Cancer Mother   . COPD Father     Social History Social History   Tobacco Use  . Smoking status: Current Every Day Smoker  Substance Use Topics  . Alcohol  use: Yes  . Drug use: No    Review of Systems  All other systems negative except as documented in the HPI. All pertinent positives and negatives as reviewed in the HPI. ____________________________________________   PHYSICAL EXAM:  VITAL SIGNS: Vitals:   03/19/18 0939  Weight: 56.7 kg  Height: 5\' 9"  (1.753 m)   Constitutional: Alert and oriented. Well appearing and in no acute distress. Eyes: Conjunctivae are normal. PERRL. EOMI. Head: Atraumatic. Nose: No congestion/rhinnorhea. Mouth/Throat: Mucous membranes are moist.  Oropharynx non-erythematous. Neck: No stridor.  No meningeal signs.   Cardiovascular: Tachycardic rate, regular rhythm. Good peripheral circulation. Grossly normal heart sounds.   Respiratory: Tachypneic respiratory effort.  No retractions. Lungs diminished. Gastrointestinal: Soft and nontender. No distention.  Musculoskeletal: No lower extremity tenderness nor edema. No gross deformities of extremities. Neurologic:  Normal speech and language. No gross focal neurologic deficits are appreciated.  Skin:  Skin is warm, dry and intact. No rash noted.   ____________________________________________   LABS (all labs ordered are listed, but only abnormal results are displayed)  Labs Reviewed  CBC WITH DIFFERENTIAL/PLATELET  COMPREHENSIVE METABOLIC PANEL  BRAIN NATRIURETIC PEPTIDE  TROPONIN I  PROCALCITONIN  HEPARIN LEVEL (UNFRACTIONATED)   ____________________________________________  EKG   EKG Interpretation  Date/Time:  Wednesday March 19 2018 09:44:50 EST Ventricular Rate:  112 PR Interval:    QRS Duration: 99 QT Interval:  334 QTC Calculation: 431 R Axis:   -  22 Text Interpretation:  Sinus tachycardia Paired ventricular premature complexes Sinus pause with ventricular escape Biatrial enlargement Left ventricular hypertrophy Repol abnrm suggests ischemia, inferior leads ST elevation, consider lateral injury Baseline wander in lead(s) II III aVF   poor ecg, needs repeat Confirmed by Merrily Pew [phone]) on 03/19/2018 4:19:13 PM       ____________________________________________  RADIOLOGY  Ct Chest W Contrast  Result Date: 03/19/2018 CLINICAL DATA:  57 year old male with a history of persisting pneumonia for 2 months EXAM: CT CHEST WITH CONTRAST TECHNIQUE: Multidetector CT imaging of the chest was performed during intravenous contrast administration. CONTRAST:  41mL OMNIPAQUE IOHEXOL 300 MG/ML  SOLN COMPARISON:  Chest x-ray 02/25/2018 07/22/2017 FINDINGS: Cardiovascular: Heart size within normal limits. The right ventricle/left ventricle ratio estimated less than 1.0. No pericardial fluid/thickening. No significant coronary calcifications. No aortic valve calcifications. Normal course caliber and contour of the thoracic aorta. Mild atherosclerotic changes. Branch vessels are patent. No periaortic fluid. Main pulmonary artery measures 2.9 cm. Filling defects within the segmental branches and subsegmental branches of the right lower lobe. Filling defects are mural and not central, with evidence of partial recanalization. There is associated hypoenhancing lung parenchyma at the right lung base. On the left no filling defects identified, however, there are regions of hypo attenuating/enhancing parenchyma at the left lung base. Mediastinum/Nodes: Borderline enlarged lymph nodes of the mediastinum, predominantly of the lower paratracheal nodes and hilar nodes as well as subcarinal nodes. Index node on the right measures 13 mm at the hilum. AP window node measures 9 mm. Circumferential wall thickening of the distal esophagus. No endotracheal or central endobronchial debris. Unremarkable appearance of the thoracic inlet. Lungs/Pleura: Advanced centrilobular and paraseptal emphysema. Mixed consolidation and ground-glass opacity of the bilateral dependent lower lobes, more pronounced on the right complete collapse of the medial segments/posterior segment.  Regions of hypoenhancement compatible with infarction. On the left there are regions of hypoenhancement in the costophrenic sulcus posteriorly. Multiple nodules of the left upper lobe and right upper lobe, in the outer 1/3 of the lung. These nodular changes were not present on the x-ray of 07/22/2017. No pleural effusion. Upper Abdomen: No acute finding Musculoskeletal: No acute displaced fracture. Mild degenerative changes of the thoracic spine. IMPRESSION: CT demonstrates evidence multifocal infection as well as pulmonary infarction in the setting of subacute/developing chronic pulmonary embolism: -the nodular and ground-glass changes of the lungs, predominantly right lower lobe, favored to represent multifocal infection given that these changes were not present on the chest x-ray of July 22, 2017. Because rapidly evolving metastatic disease is not excluded, follow-up contrast-enhanced chest CT is recommended once the patient has been treated in 2-3 months. -superimposed pulmonary infarction of the right greater than left lower lobes with associated atelectasis/volume loss. Mural based pulmonary embolism of the right lower lobe segmental/subsegmental branches with partial recanalization, compatible with subacute to early chronic DVT. Circumferential distal esophageal thickening. This may be seen in the setting of longstanding GERD. Correlation with symptoms as well as potentially referral for upper endoscopy may be useful. Advanced emphysema.  Emphysema (ICD10-J43.9). Aortic Atherosclerosis (ICD10-I70.0). These results will be called to the ordering clinician or representative by the Radiologist Assistant, and communication documented in the PACS or zVision Dashboard. Electronically Signed   By: Corrie Mckusick D.O.   On: 03/19/2018 08:13    ____________________________________________   PROCEDURES  Procedure(s) performed:   Procedures  CRITICAL CARE Performed by: Merrily Pew Total critical care time:  35 minutes Critical care time was exclusive of  separately billable procedures and treating other patients. Critical care was necessary to treat or prevent imminent or life-threatening deterioration. Critical care was time spent personally by me on the following activities: development of treatment plan with patient and/or surrogate as well as nursing, discussions with consultants, evaluation of patient's response to treatment, examination of patient, obtaining history from patient or surrogate, ordering and performing treatments and interventions, ordering and review of laboratory studies, ordering and review of radiographic studies, pulse oximetry and re-evaluation of patient's condition.  ____________________________________________   INITIAL IMPRESSION / ASSESSMENT AND PLAN / ED COURSE  Based on history and CT scan read I suspect that he likely has a new lung cancer that his main risk factor for this new pleural embolus.  He is tachypneic, hypoxic to 92% at rest on room air with some intermittent chest pain.  Patient will need to be admitted for work-up and further management.  Heparin started.  We will hold on antibiotics unless it becomes absolutely evident that this multifocal process is pneumonia however once again I think is more likely malignancy.   Pertinent labs & imaging results that were available during my care of the patient were reviewed by me and considered in my medical decision making (see chart for details).  ____________________________________________  FINAL CLINICAL IMPRESSION(S) / ED DIAGNOSES  Final diagnoses:  Acute respiratory failure with hypoxia ([place])  Other acute pulmonary embolism, unspecified whether acute cor pulmonale present ([facility])     MEDICATIONS GIVEN DURING THIS VISIT:  Medications  heparin bolus via infusion 3,500 Units (has no administration in time range)  heparin ADULT infusion 100 units/mL (25000 units/269mL sodium chloride 0.45%) (has no  administration in time range)     NEW OUTPATIENT MEDICATIONS STARTED DURING THIS VISIT:  New Prescriptions   No medications on file    Note:  This note was prepared with assistance of Dragon voice recognition software. Occasional wrong-word or sound-a-like substitutions may have occurred due to the inherent limitations of voice recognition software.   Merrily Pew, MD 03/19/18 [phone]

## 2018-03-19 NOTE — Consult Note (Addendum)
NAME:  Caleb Harper, MRN:  [id number], DOB:  1960-06-21, LOS: 0 ADMISSION DATE:  03/19/2018, CONSULTATION DATE:   REFERRING MD:  Dr. Dayna Barker, CHIEF COMPLAINT:  PE    Brief History   57 y/o M, smoker admitted with outpatient CT chest showing PE, distal esophageal thickening and GGO.    History of present illness   57 y/o M, smoker, who presented to [facility] on 11/27 with reports of PE on outside CT scan.    The patient reported increased fatigue, decreased appetite and weight loss.  He had been experiencing symptoms for 2-3 months.  The patient was seen in the middle of October for non-productive cough & SOB.  The patient was diagnosed with pneumonia at that time and treated with antibiotics.  He followed with with his PCP and completed 3 rounds of antibiotics without improvement.  He developed increasing shortness of breath with exertion and intermittent chest pain.  He followed up with his PCP on 11/26 and was sent for CT imaging of the chest.    Outpatient CT of the chest with contrast demonstrated nodular & ground glass changes in the RLL, superimposed pulmonary infarction of R>L lower lobes with associated atelectasis / volume loss. Mural based pulmoanry embolism of the RLL segmental / subsegmental branches with partial recanalization compatible with subacute to early chronic DVT.  Circumferential distal esophageal thickening.  Advanced emphysema. The patient was referred for inpatient admit.  ER labs - Na 136, K 3.9, Cl 102, glucose 115, sr 1.03, alk phos 130, BNP 190, troponin <0.03, PCT <0.10,  WBC 15, Hgb 14.7 and platelets 325.    PCCM consulted for evaluation of infiltrates, PE.   Past Medical History  Back pain, tobacco abuse   Significant Hospital Events   11/27  Admit with PE   Consults:  PCCM 11/27  Procedures:    Significant Diagnostic Tests:  CT Chest with Contrast 11/26 >> nodular & ground glass changes in the RLL, superimposed pulmonary infarction of R>L lower lobes with  associated atelectasis / volume loss. Mural based pulmoanry embolism of the RLL segmental / subsegmental branches with partial recanalization compatible with subacute to early chronic DVT.  Circumferential distal esophageal thickening.  Advanced emphysema.    Micro Data:     Antimicrobials:     Interim history/subjective:     Objective   Blood pressure (!) 112/96, pulse 72, resp. rate 20, height 5' 9"  (1.753 m), weight 56.7 kg, SpO2 95 %.       No intake or output data in the 24 hours ending 03/19/18 1505 Filed Weights   03/19/18 0939  Weight: 56.7 kg    Examination: General: adult male lying on stretcher in NAD  HEENT: MM pink/moist, no JVD Neuro: AAOx4, speech clear, MAE  CV: s1s2 rrr, no m/r/g PULM: even/non-labored, lungs bilaterally clear TJ:QZES, non-tender, bsx4 active  Extremities: warm/dry, no edema  Skin: no rashes or lesions  Resolved Hospital Problem list      Assessment & Plan:   Pulmonary Embolism  -RLL segmental, subsegmental  -pt stable on RA with no respiratory distress  P: Heparin gtt per pharmacy / primary  Recommend transition to Eliquis when ready for PO for ~ 6 weeks therapy before interruption for biopsy  Await ECHO Troponin negative   Nodular Infiltrates & GGO Bilaterally -concern for underlying malignancy given admit with PE, smoking history, weight loss and imaging findings.  Negative PCT / low suspicion for PNA.  P: Will need outpatient PET scan  Consider IR biopsy of nodular infiltrate once PET imaging complete Follow up with Pulmonary as outpatient   Best practice:  Diet: As tolerated  Pain/Anxiety/Delirium protocol (if indicated): n/a VAP protocol (if indicated): n/a  DVT prophylaxis: per primary  GI prophylaxis: n/a  Glucose control: n/a  Mobility: As tolerated  Code Status: Full code  Family Communication: Patient updated on plan of care per Dr. Nelda Marseille.  Disposition: Medical floor.  PCCM will be available PRN.  Please call  back if new needs arise.   Labs   CBC: Recent Labs  Lab 03/19/18 0946  WBC 15.0*  NEUTROABS 10.2*  HGB 14.7  HCT 46.2  MCV 93.5  PLT 156    Basic Metabolic Panel: Recent Labs  Lab 03/19/18 0946  NA 136  K 3.9  CL 102  CO2 23  GLUCOSE 115*  BUN 12  CREATININE 1.03  CALCIUM 9.8   GFR: Estimated Creatinine Clearance: 64.2 mL/min (by C-G formula based on SCr of 1.03 mg/dL). Recent Labs  Lab 03/19/18 0946  PROCALCITON <0.10  WBC 15.0*    Liver Function Tests: Recent Labs  Lab 03/19/18 0946  AST 19  ALT 13  ALKPHOS 130*  BILITOT 0.6  PROT 7.6  ALBUMIN 3.5   No results for input(s): LIPASE, AMYLASE in the last 168 hours. No results for input(s): AMMONIA in the last 168 hours.  ABG No results found for: PHART, PCO2ART, PO2ART, HCO3, TCO2, ACIDBASEDEF, O2SAT   Coagulation Profile: No results for input(s): INR, PROTIME in the last 168 hours.  Cardiac Enzymes: Recent Labs  Lab 03/19/18 0946  TROPONINI <0.03    HbA1C: No results found for: HGBA1C  CBG: No results for input(s): GLUCAP in the last 168 hours.  Review of Systems: Positives in bold   Gen: Denies fever, chills, weight loss, fatigue, night sweats HEENT: Denies blurred vision, double vision, hearing loss, tinnitus, sinus congestion, rhinorrhea, sore throat, neck stiffness, dysphagia PULM: Denies shortness of breath, cough, sputum production, hemoptysis, wheezing CV: Denies chest pain, edema, orthopnea, paroxysmal nocturnal dyspnea, palpitations GI: Denies abdominal pain, nausea, vomiting, diarrhea, hematochezia, melena, constipation, change in bowel habits GU: Denies dysuria, hematuria, polyuria, oliguria, urethral discharge Endocrine: Denies hot or cold intolerance, polyuria, polyphagia or appetite change Derm: Denies rash, dry skin, scaling or peeling skin change Heme: Denies easy bruising, bleeding, bleeding gums Neuro: Denies headache, numbness, weakness, slurred speech, loss of memory  or consciousness   Past Medical History  He,  has a past medical history of Chronic back pain.   Surgical History    Past Surgical History:  Procedure Laterality Date  . BACK SURGERY       Social History   reports that he has been smoking. He does not have any smokeless tobacco history on file. He reports that he drinks alcohol. He reports that he does not use drugs.   Family History   His family history includes COPD in his father; Cancer in his mother.   Allergies No Known Allergies   Home Medications  Prior to Admission medications   Medication Sig Start Date End Date Taking? Authorizing Provider  oxyCODONE-acetaminophen (PERCOCET/ROXICET) 5-325 MG tablet Take 1-2 tablets by mouth every 6 (six) hours as needed for moderate pain.    Yes [provider]  ibuprofen (ADVIL,MOTRIN) 800 MG tablet Take 1 tablet (800 mg total) by mouth 3 (three) times daily. Patient not taking: Reported on 03/19/2018 01/09/12   Gertha Calkin, PA-C  Noe Gens, NP-C [facility] Pgr: [phone] or if no answer [phone] 03/19/2018, 3:05 PM  Attending Note:  57 year old male with tobacco abuse history who presents with none clearing pneumonia x3, PE and diffuse pulmonary nodules.  On exam, patient has clear lungs and does not seem hypoxemic.  I reviewed chest CT myself, RLL segmental PE and diffuse nodules namely LLL with infiltrate.  Discussed with PCCM-NP.  Infiltrate: concern for PNA but more concern for cancer  - Levofloxacin   - Vancomycin   - Pan culture  - PCT  - If PCT is negative would stop abx quickly  - F/U imaging in 6-8 wks  Pulmonary nodules:  - PET scan as outpatient  - F/U with CT guided biopsy after 6 wks (stabilization of the clot then can hold for biopsy)  - No role for bronchoscopy here  - May f/u as outpatient if needed  PE:  - Supplemental PE as needed  - Heparin  - Start oral NOAC per primary team  - Hold NOAC  for biopsy  PCCM will be available PRN  Patient seen and examined, agree with above note.  I dictated the care and orders written for this patient under my direction.  Rush Farmer, [place]

## 2018-03-19 NOTE — Progress Notes (Signed)
ANTICOAGULATION CONSULT NOTE - Initial Consult  Pharmacy Consult for heparin Indication: pulmonary embolus  No Known Allergies  Patient Measurements: Height: 5\' 9"  (175.3 cm) Weight: 125 lb (56.7 kg) IBW/kg (Calculated) : 70.7 Heparin Dosing Weight: 56.7kg  Vital Signs:    Labs: No results for input(s): HGB, HCT, PLT, APTT, LABPROT, INR, HEPARINUNFRC, HEPRLOWMOCWT, CREATININE, CKTOTAL, CKMB, TROPONINI in the last 72 hours.  CrCl cannot be calculated (No successful lab value found.).   Medical History: Past Medical History:  Diagnosis Date  . Chronic back pain     Medications:  Scheduled:   Assessment: 41 YOM presenting with CP/SOB, PE on CT and pharmacy consulted to start heparin.  No AC PTA.  Goal of Therapy:  Heparin level 0.3-0.7 units/ml Monitor platelets by anticoagulation protocol: Yes   Plan:  Heparin 3500 units IV x 1, then gtt at 1000 units/hr F/u 6 hour heparin level  Bertis Ruddy, PharmD Clinical Pharmacist Please check AMION for all [place] numbers 03/19/2018 10:08 AM

## 2018-03-19 NOTE — ED Notes (Signed)
ED TO INPATIENT HANDOFF REPORT  Name/Age/Gender Caleb Harper 57 y.o. male  Code Status    Code Status Orders  (From admission, onward)         Start     Ordered   03/19/18 1234  Full code  Continuous     03/19/18 1236        Code Status History    This patient has a current code status but no historical code status.      Home/SNF/Other Home  Chief Complaint SHOB; Blood Clot in Lung  Level of Care/Admitting Diagnosis ED Disposition    ED Disposition Condition [facility] Area: [place] [[id number]]  Level of Care: Telemetry [5]  Diagnosis: Acute pulmonary embolism [facility]) [[id number]]  Admitting Physician: Guilford Shi [[id number]]  Attending Physician: Guilford Shi [[id number]]  Estimated length of stay: 3 - 4 days  Certification:: I certify this patient will need inpatient services for at least 2 midnights  PT Class (Do Not Modify): Inpatient [101]  PT Acc Code (Do Not Modify): Private [1]       Medical History Past Medical History:  Diagnosis Date  . Chronic back pain     Allergies No Known Allergies  IV Location/Drains/Wounds Patient Lines/Drains/Airways Status   Active Line/Drains/Airways    Name:   Placement date:   Placement time:   Site:   Days:   Peripheral IV 03/19/18 Left Antecubital   03/19/18    0956    Antecubital   less than 1          Labs/Imaging Results for orders placed or performed during the hospital encounter of 03/19/18 (from the past 48 hour(s))  CBC with Differential     Status: Abnormal   Collection Time: 03/19/18  9:46 AM  Result Value Ref Range   WBC 15.0 (H) 4.0 - 10.5 K/uL   RBC 4.94 4.22 - 5.81 MIL/uL   Hemoglobin 14.7 13.0 - 17.0 g/dL   HCT 46.2 39.0 - 52.0 %   MCV 93.5 80.0 - 100.0 fL   MCH 29.8 26.0 - 34.0 pg   MCHC 31.8 30.0 - 36.0 g/dL   RDW 12.4 11.5 - 15.5 %   Platelets 325 150 - 400 K/uL   nRBC 0.0 0.0 - 0.2 %   Neutrophils Relative % 69 %   Neutro Abs 10.2 (H) 1.7 -  7.7 K/uL   Lymphocytes Relative 19 %   Lymphs Abs 2.9 0.7 - 4.0 K/uL   Monocytes Relative 9 %   Monocytes Absolute 1.4 (H) 0.1 - 1.0 K/uL   Eosinophils Relative 0 %   Eosinophils Absolute 0.1 0.0 - 0.5 K/uL   Basophils Relative 1 %   Basophils Absolute 0.1 0.0 - 0.1 K/uL   Immature Granulocytes 2 %   Abs Immature Granulocytes 0.27 (H) 0.00 - 0.07 K/uL    Comment: Performed at [facility], [address], [place], [place] [address]  Comprehensive metabolic panel     Status: Abnormal   Collection Time: 03/19/18  9:46 AM  Result Value Ref Range   Sodium 136 135 - 145 mmol/L   Potassium 3.9 3.5 - 5.1 mmol/L   Chloride 102 98 - 111 mmol/L   CO2 23 22 - 32 mmol/L   Glucose, Bld 115 (H) 70 - 99 mg/dL   BUN 12 6 - 20 mg/dL   Creatinine, Ser 1.03 0.61 - 1.24 mg/dL   Calcium 9.8 8.9 - 10.3 mg/dL  Total Protein 7.6 6.5 - 8.1 g/dL   Albumin 3.5 3.5 - 5.0 g/dL   AST 19 15 - 41 U/L   ALT 13 0 - 44 U/L   Alkaline Phosphatase 130 (H) 38 - 126 U/L   Total Bilirubin 0.6 0.3 - 1.2 mg/dL   GFR calc non Af Amer >60 >60 mL/min   GFR calc Af Amer >60 >60 mL/min   Anion gap 11 5 - 15    Comment: Performed at [facility] [address] [place], [place] [address]  Brain natriuretic peptide     Status: Abnormal   Collection Time: 03/19/18  9:46 AM  Result Value Ref Range   B Natriuretic Peptide 190.9 (H) 0.0 - 100.0 pg/mL    Comment: Performed at [facility] [address], [place], [place] [address]  Troponin I - ONCE - STAT     Status: None   Collection Time: 03/19/18  9:46 AM  Result Value Ref Range   Troponin I <0.03 <0.03 ng/mL    Comment: Performed at [facility] [address] [place], [place] [address]  Procalcitonin     Status: None   Collection Time: 03/19/18  9:46 AM  Result Value Ref Range   Procalcitonin <0.10 ng/mL    Comment:        Interpretation: PCT (Procalcitonin) <= 0.5 ng/mL: Systemic infection (sepsis) is not likely. Local bacterial  infection is possible. (NOTE)       Sepsis PCT Algorithm           Lower Respiratory Tract                                      Infection PCT Algorithm    ----------------------------     ----------------------------         PCT < 0.25 ng/mL                PCT < 0.10 ng/mL         Strongly encourage             Strongly discourage   discontinuation of antibiotics    initiation of antibiotics    ----------------------------     -----------------------------       PCT 0.25 - 0.50 ng/mL            PCT 0.10 - 0.25 ng/mL               OR       >80% decrease in PCT            Discourage initiation of                                            antibiotics      Encourage discontinuation           of antibiotics    ----------------------------     -----------------------------         PCT >= 0.50 ng/mL              PCT 0.26 - 0.50 ng/mL               AND        <80% decrease in PCT  Encourage initiation of                                             antibiotics       Encourage continuation           of antibiotics    ----------------------------     -----------------------------        PCT >= 0.50 ng/mL                  PCT > 0.50 ng/mL               AND         increase in PCT                  Strongly encourage                                      initiation of antibiotics    Strongly encourage escalation           of antibiotics                                     -----------------------------                                           PCT <= 0.25 ng/mL                                                 OR                                        > 80% decrease in PCT                                     Discontinue / Do not initiate                                             antibiotics Performed at [facility], [address], [place], [place] [address]    Ct Chest W Contrast  Result Date: 03/19/2018 CLINICAL DATA:  57 year old male with a history of persisting  pneumonia for 2 months EXAM: CT CHEST WITH CONTRAST TECHNIQUE: Multidetector CT imaging of the chest was performed during intravenous contrast administration. CONTRAST:  64mL OMNIPAQUE IOHEXOL 300 MG/ML  SOLN COMPARISON:  Chest x-ray 02/25/2018 07/22/2017 FINDINGS: Cardiovascular: Heart size within normal limits. The right ventricle/left ventricle ratio estimated less than 1.0. No pericardial fluid/thickening. No significant coronary calcifications. No aortic valve calcifications. Normal course caliber and contour of the thoracic aorta. Mild atherosclerotic changes. Branch vessels are patent. No periaortic fluid. Main pulmonary artery measures 2.9 cm. Filling defects  within the segmental branches and subsegmental branches of the right lower lobe. Filling defects are mural and not central, with evidence of partial recanalization. There is associated hypoenhancing lung parenchyma at the right lung base. On the left no filling defects identified, however, there are regions of hypo attenuating/enhancing parenchyma at the left lung base. Mediastinum/Nodes: Borderline enlarged lymph nodes of the mediastinum, predominantly of the lower paratracheal nodes and hilar nodes as well as subcarinal nodes. Index node on the right measures 13 mm at the hilum. AP window node measures 9 mm. Circumferential wall thickening of the distal esophagus. No endotracheal or central endobronchial debris. Unremarkable appearance of the thoracic inlet. Lungs/Pleura: Advanced centrilobular and paraseptal emphysema. Mixed consolidation and ground-glass opacity of the bilateral dependent lower lobes, more pronounced on the right complete collapse of the medial segments/posterior segment. Regions of hypoenhancement compatible with infarction. On the left there are regions of hypoenhancement in the costophrenic sulcus posteriorly. Multiple nodules of the left upper lobe and right upper lobe, in the outer 1/3 of the lung. These nodular changes were  not present on the x-ray of 07/22/2017. No pleural effusion. Upper Abdomen: No acute finding Musculoskeletal: No acute displaced fracture. Mild degenerative changes of the thoracic spine. IMPRESSION: CT demonstrates evidence multifocal infection as well as pulmonary infarction in the setting of subacute/developing chronic pulmonary embolism: -the nodular and ground-glass changes of the lungs, predominantly right lower lobe, favored to represent multifocal infection given that these changes were not present on the chest x-ray of July 22, 2017. Because rapidly evolving metastatic disease is not excluded, follow-up contrast-enhanced chest CT is recommended once the patient has been treated in 2-3 months. -superimposed pulmonary infarction of the right greater than left lower lobes with associated atelectasis/volume loss. Mural based pulmonary embolism of the right lower lobe segmental/subsegmental branches with partial recanalization, compatible with subacute to early chronic DVT. Circumferential distal esophageal thickening. This may be seen in the setting of longstanding GERD. Correlation with symptoms as well as potentially referral for upper endoscopy may be useful. Advanced emphysema.  Emphysema (ICD10-J43.9). Aortic Atherosclerosis (ICD10-I70.0). These results will be called to the ordering clinician or representative by the Radiologist Assistant, and communication documented in the PACS or zVision Dashboard. Electronically Signed   By: Corrie Mckusick D.O.   On: 03/19/2018 08:13    Pending Labs Unresulted Labs (From admission, onward)    Start     Ordered   03/20/18 0500  Heparin level (unfractionated)  Daily,   R     03/19/18 1010   03/20/18 0500  CBC  Daily,   R     03/19/18 1010   03/20/18 3212  Basic metabolic panel  Tomorrow morning,   R     03/19/18 1236   03/20/18 0500  CBC  Tomorrow morning,   R     03/19/18 1236   03/19/18 1600  Heparin level (unfractionated)  Once-Timed,   R     03/19/18  1010   03/19/18 1233  HIV antibody (Routine Testing)  Once,   R     03/19/18 1236          Vitals/Pain Today's Vitals   03/19/18 1030 03/19/18 1100 03/19/18 1115 03/19/18 1130  BP: (!) 147/100 (!) 144/94 (!) 155/96 (!) 112/96  Pulse: 64 79 70 72  Resp: (!) 21 20 (!) 23 20  SpO2: 96% 96% 94% 95%  Weight:      Height:      PainSc:  Isolation Precautions No active isolations  Medications Medications  heparin ADULT infusion 100 units/mL (25000 units/298mL sodium chloride 0.45%) (1,000 Units/hr Intravenous New Bag/Given 03/19/18 1100)  vitamin C (ASCORBIC ACID) tablet 500 mg (has no administration in time range)  acetaminophen (TYLENOL) tablet 650 mg (has no administration in time range)    Or  acetaminophen (TYLENOL) suppository 650 mg (has no administration in time range)  oxyCODONE (Oxy IR/ROXICODONE) immediate release tablet 5 mg (has no administration in time range)  polyethylene glycol (MIRALAX / GLYCOLAX) packet 17 g (has no administration in time range)  ondansetron (ZOFRAN) tablet 4 mg (has no administration in time range)    Or  ondansetron (ZOFRAN) injection 4 mg (has no administration in time range)  multivitamin with minerals tablet 1 tablet (has no administration in time range)  pantoprazole (PROTONIX) EC tablet 40 mg (has no administration in time range)  ipratropium-albuterol (DUONEB) 0.5-2.5 (3) MG/3ML nebulizer solution 3 mL (has no administration in time range)  hydrALAZINE (APRESOLINE) injection 5 mg (has no administration in time range)  heparin bolus via infusion 3,500 Units (3,500 Units Intravenous Bolus from Bag 03/19/18 1100)    Mobility walks

## 2018-03-19 NOTE — Progress Notes (Signed)
HR up to 204. Patient had ambulated to bathroom, asymptomatic. Dr. Earnest Conroy on floor and made aware. Will continue to monitor. Metoprolol ordered with parameters. Dr. Michela Pitcher if HR more than 160 sustaining call.

## 2018-03-20 ENCOUNTER — Inpatient Hospital Stay (HOSPITAL_COMMUNITY): Payer: BLUE CROSS/BLUE SHIELD

## 2018-03-20 DIAGNOSIS — I361 Nonrheumatic tricuspid (valve) insufficiency: Secondary | ICD-10-CM

## 2018-03-20 DIAGNOSIS — R911 Solitary pulmonary nodule: Secondary | ICD-10-CM

## 2018-03-20 DIAGNOSIS — Z720 Tobacco use: Secondary | ICD-10-CM

## 2018-03-20 DIAGNOSIS — M7989 Other specified soft tissue disorders: Secondary | ICD-10-CM

## 2018-03-20 DIAGNOSIS — J690 Pneumonitis due to inhalation of food and vomit: Secondary | ICD-10-CM

## 2018-03-20 LAB — BASIC METABOLIC PANEL
Anion gap: 10 (ref 5–15)
BUN: 10 mg/dL (ref 6–20)
CHLORIDE: 101 mmol/L (ref 98–111)
CO2: 24 mmol/L (ref 22–32)
Calcium: 8.9 mg/dL (ref 8.9–10.3)
Creatinine, Ser: 0.91 mg/dL (ref 0.61–1.24)
GFR calc Af Amer: >60 mL/min (ref >60)
GLUCOSE: 93 mg/dL (ref 70–99)
POTASSIUM: 3.9 mmol/L (ref 3.5–5.1)
Sodium: 135 mmol/L (ref 135–145)

## 2018-03-20 LAB — CBC
HEMATOCRIT: 43.4 % (ref 39.0–52.0)
HEMOGLOBIN: 13.6 g/dL (ref 13.0–17.0)
MCH: 28.5 pg (ref 26.0–34.0)
MCHC: 31.3 g/dL (ref 30.0–36.0)
MCV: 91 fL (ref 80.0–100.0)
PLATELETS: 298 10*3/uL (ref 150–400)
RBC: 4.77 MIL/uL (ref 4.22–5.81)
RDW: 12.4 % (ref 11.5–15.5)
WBC: 13.3 10*3/uL — ABNORMAL HIGH (ref 4.0–10.5)
nRBC: 0 % (ref 0.0–0.2)

## 2018-03-20 LAB — ECHOCARDIOGRAM COMPLETE
Height: 69 in
Weight: 2000 oz

## 2018-03-20 LAB — HEPARIN LEVEL (UNFRACTIONATED)
HEPARIN UNFRACTIONATED: 0.48 [IU]/mL (ref 0.30–0.70)
Heparin Unfractionated: 0.4 IU/mL (ref 0.30–0.70)

## 2018-03-20 LAB — HIV ANTIBODY (ROUTINE TESTING W REFLEX): HIV SCREEN 4TH GENERATION: NONREACTIVE

## 2018-03-20 NOTE — Progress Notes (Signed)
VASCULAR LAB PRELIMINARY  PRELIMINARY  PRELIMINARY  PRELIMINARY  Bilateral lower extremity venous completed.    Preliminary report:  There is no DVT or SVT noted in the bilateral lower extremities.   Rhian Asebedo, RVT 03/20/2018, 11:44 AM

## 2018-03-20 NOTE — Progress Notes (Signed)
ANTICOAGULATION CONSULT NOTE  Pharmacy Consult:  Heparin Indication: pulmonary embolus  No Known Allergies  Patient Measurements: Height: 5\' 9"  (175.3 cm) Weight: 125 lb (56.7 kg) IBW/kg (Calculated) : 70.7 Heparin Dosing Weight: 57 kg  Vital Signs: Temp: 98.4 F (36.9 C) (11/28 0457) Temp Source: Oral (11/28 0457) BP: 130/86 (11/28 0457) Pulse Rate: 63 (11/28 0457)  Labs: Recent Labs    03/19/18 0946 03/19/18 1630 03/19/18 2318 03/20/18 0549  HGB 14.7  --   --  13.6  HCT 46.2  --   --  43.4  PLT 325  --   --  298  HEPARINUNFRC  --  0.57 0.48 0.40  CREATININE 1.03  --   --  0.91  TROPONINI <0.03  --   --   --     Estimated Creatinine Clearance: 72.7 mL/min (by C-G formula based on SCr of 0.91 mg/dL).    Assessment: 71 YOM presenting with CP/SOB, PE on CT and Pharmacy consulted to start heparin.  No AC PTA.  Heparin level =0.40 remains therapeutic on heparin drip rate 1000 units/hr. No bleeding reported. MD noted PE appears stable with no clinical features suggestive of RV strain. Awaiting LE doppler and ECHO.  If clinically stability continues, MD suspects we can transition to [place].    Goal of Therapy:  Heparin level 0.3-0.7 units/ml Monitor platelets by anticoagulation protocol: Yes    Plan:  Continue heparin gtt at 1000 units/hr F/u daily heparin level and CBC F/u for transition to oral anticoagulation and educate patient.  Thanks for allowing pharmacy to be a part of this patient's care.  Nicole Cella, RPh Clinical Pharmacist Please check AMION for all [place] phone numbers After 10:00 PM, call [place] [phone] 03/20/2018, 11:28 AM

## 2018-03-20 NOTE — Progress Notes (Signed)
Echocardiogram 2D Echocardiogram has been performed.  Caleb Harper 03/20/2018, 12:04 PM

## 2018-03-20 NOTE — Plan of Care (Signed)

## 2018-03-20 NOTE — Progress Notes (Signed)
ANTICOAGULATION CONSULT NOTE  Pharmacy Consult:  Heparin Indication: pulmonary embolus  No Known Allergies  Patient Measurements: Height: 5\' 9"  (175.3 cm) Weight: 125 lb (56.7 kg) IBW/kg (Calculated) : 70.7 Heparin Dosing Weight: 57 kg  Vital Signs: Temp: 97.5 F (36.4 C) (11/27 2154) Temp Source: Oral (11/27 2154) BP: 126/89 (11/27 2154) Pulse Rate: 79 (11/27 2154)  Labs: Recent Labs    03/19/18 0946 03/19/18 1630 03/19/18 2318  HGB 14.7  --   --   HCT 46.2  --   --   PLT 325  --   --   HEPARINUNFRC  --  0.57 0.48  CREATININE 1.03  --   --   TROPONINI <0.03  --   --     Estimated Creatinine Clearance: 64.2 mL/min (by C-G formula based on SCr of 1.03 mg/dL).    Assessment: 49 YOM presenting with CP/SOB, PE on CT and Pharmacy consulted to start heparin.  No AC PTA.  Heparin level is therapeutic; no bleeding reported.   Goal of Therapy:  Heparin level 0.3-0.7 units/ml Monitor platelets by anticoagulation protocol: Yes    Plan:  Continue heparin gtt at 1000 units/hr F/u daily labs  Thanks for allowing pharmacy to be a part of this patient's care.  Excell Seltzer, PharmD Clinical Pharmacist 03/20/2018, 12:45 AM

## 2018-03-20 NOTE — Progress Notes (Signed)
Patient off floor to vascular.

## 2018-03-20 NOTE — Progress Notes (Signed)
PROGRESS NOTE        PATIENT DETAILS Name: Caleb Harper Age: 57 y.o. Sex: male Date of Birth: 1960-10-27 Admit Date: 03/19/2018 Admitting Physician Guilford Shi, MD JKK:[id number], L.Marlou Sa, MD  Brief Narrative: Patient is a 57 y.o. male with history of chronic back pain, tobacco use-recently quit-presenting with cough and shortness of breath.  Found to have pulmonary embolism and multiple nodular and groundglass opacities in bilateral lungs.  Started on IV heparin-and empiric antimicrobial therapy and admitted to the hospitalist service.  See below for further details.  Subjective: Feels better-continues to have some pleuritic chest pain.  No shortness of breath.  Assessment/Plan: Pulmonary embolism: Appears stable with no clinical features suggestive of RV strain.  Await lower extremity Doppler and echocardiogram.  If clinical stability continues-suspect we can transition to [place].  This appears to be unprovoked VTE.  Pneumonia versus lung malignancy: Afebrile-does not look toxic-leukocytosis is trending down-has apparently completed 3 rounds of antibiotics as an outpatient-procalcitonin is not elevated.  Since looks nontoxic and leukocytosis is trending down-reasonable to monitor off antimicrobial therapy while inpatient.  Multiple lung nodules: Given history of smoking-concern for lung malignancy-however due to pulmonary embolism-probably needs anticoagulation for a few weeks before consideration of any sort of biopsy including bronchoscopy.  Evaluated by pulmonology on 11/27-recommendations are for outpatient PET scan-and possibly CT-guided biopsy  Tobacco abuse: Apparently quit 3 weeks back.  Continue counseling  Chronic back pain: Continue as needed narcotics  DVT Prophylaxis: Full dose anticoagulation with Heparin  Code Status: Full code  Family Communication: None at bedside  Disposition Plan: Remain inpatient-home in the next few  days.  Antimicrobial agents: Anti-infectives (From admission, onward)   None     Procedures: None  CONSULTS:  pulmonary/intensive care  Time spent: 25- minutes-Greater than 50% of this time was spent in counseling, explanation of diagnosis, planning of further management, and coordination of care.  MEDICATIONS: Scheduled Meds: . metoprolol tartrate  25 mg Oral BID  . multivitamin with minerals  1 tablet Oral Daily  . pantoprazole  40 mg Oral Daily  . vitamin C  500 mg Oral Daily   Continuous Infusions: . heparin 1,000 Units/hr (03/20/18 0509)   PRN Meds:.acetaminophen **OR** acetaminophen, hydrALAZINE, ipratropium-albuterol, ondansetron **OR** ondansetron (ZOFRAN) IV, oxyCODONE, polyethylene glycol   PHYSICAL EXAM: Vital signs: Vitals:   03/19/18 1928 03/19/18 2142 03/19/18 2154 03/20/18 0457  BP: (!) 154/77  126/89 130/86  Pulse: 69 (!) 57 79 63  Resp: 19  18 18   Temp: (!) 97.5 F (36.4 C)  (!) 97.5 F (36.4 C) 98.4 F (36.9 C)  TempSrc: Oral  Oral Oral  SpO2: 90%  94% 95%  Weight:      Height:       Filed Weights   03/19/18 0939  Weight: 56.7 kg   Body mass index is 18.46 kg/m.   General appearance :Awake, alert, not in any distress.  Eyes:Pink conjunctiva HEENT: Atraumatic and Normocephalic Neck: supple Resp:Good air entry bilaterally, no added sounds  CVS: S1 S2 regular, no murmurs.  GI: Bowel sounds present, Non tender and not distended with no gaurding, rigidity or rebound.No organomegaly Extremities: B/L Lower Ext shows no edema, both legs are warm to touch Neurology:  speech clear,Non focal, sensation is grossly intact. Psychiatric: Normal judgment and insight. Alert and oriented x 3. Normal mood. Musculoskeletal:No digital cyanosis Skin:No  Rash, warm and dry Wounds:N/A  I have personally reviewed following labs and imaging studies  LABORATORY DATA: CBC: Recent Labs  Lab 03/19/18 0946 03/20/18 0549  WBC 15.0* 13.3*  NEUTROABS 10.2*   --   HGB 14.7 13.6  HCT 46.2 43.4  MCV 93.5 91.0  PLT 325 361    Basic Metabolic Panel: Recent Labs  Lab 03/19/18 0946 03/20/18 0549  NA 136 135  K 3.9 3.9  CL 102 101  CO2 23 24  GLUCOSE 115* 93  BUN 12 10  CREATININE 1.03 0.91  CALCIUM 9.8 8.9    GFR: Estimated Creatinine Clearance: 72.7 mL/min (by C-G formula based on SCr of 0.91 mg/dL).  Liver Function Tests: Recent Labs  Lab 03/19/18 0946  AST 19  ALT 13  ALKPHOS 130*  BILITOT 0.6  PROT 7.6  ALBUMIN 3.5   No results for input(s): LIPASE, AMYLASE in the last 168 hours. No results for input(s): AMMONIA in the last 168 hours.  Coagulation Profile: No results for input(s): INR, PROTIME in the last 168 hours.  Cardiac Enzymes: Recent Labs  Lab 03/19/18 0946  TROPONINI <0.03    BNP (last 3 results) No results for input(s): PROBNP in the last 8760 hours.  HbA1C: No results for input(s): HGBA1C in the last 72 hours.  CBG: No results for input(s): GLUCAP in the last 168 hours.  Lipid Profile: No results for input(s): CHOL, HDL, LDLCALC, TRIG, CHOLHDL, LDLDIRECT in the last 72 hours.  Thyroid Function Tests: No results for input(s): TSH, T4TOTAL, FREET4, T3FREE, THYROIDAB in the last 72 hours.  Anemia Panel: No results for input(s): VITAMINB12, FOLATE, FERRITIN, TIBC, IRON, RETICCTPCT in the last 72 hours.  Urine analysis: No results found for: COLORURINE, APPEARANCEUR, LABSPEC, PHURINE, GLUCOSEU, HGBUR, BILIRUBINUR, KETONESUR, PROTEINUR, UROBILINOGEN, NITRITE, LEUKOCYTESUR  Sepsis Labs: Lactic Acid, Venous No results found for: LATICACIDVEN  MICROBIOLOGY: No results found for this or any previous visit (from the past 240 hour(s)).  RADIOLOGY STUDIES/RESULTS: Dg Chest 2 View  Result Date: 02/25/2018 CLINICAL DATA:  Follow-up examination for pneumonia. Shortness of breath with productive cough. EXAM: CHEST - 2 VIEW COMPARISON:  Prior radiograph from 07/22/2017. FINDINGS: Cardiac and  mediastinal silhouettes are stable in size and contour, and remain within normal limits. Lungs normally inflated. Patchy multifocal infiltrates involving the left upper and lower lobes, concerning for multifocal infiltrates. Right basilar opacity that partially silhouettes the right heart border consistent with right middle lobe atelectasis, although superimposed infection would be difficult to exclude. Right minor fissure displaced inferiorly. No pulmonary edema or pleural effusion. No pneumothorax. No acute osseous abnormality. IMPRESSION: 1. Patchy multifocal opacities involving the left upper and lower lobes, concerning for persistent multifocal infection/pneumonia. 2. Additional patchy right basilar opacity, favored to in large part reflect right middle lobe atelectasis, although superimposed infiltrate within this region would be difficult to exclude. Electronically Signed   By: Jeannine Boga M.D.   On: 02/25/2018 15:57   Ct Chest W Contrast  Result Date: 03/19/2018 CLINICAL DATA:  57 year old male with a history of persisting pneumonia for 2 months EXAM: CT CHEST WITH CONTRAST TECHNIQUE: Multidetector CT imaging of the chest was performed during intravenous contrast administration. CONTRAST:  11mL OMNIPAQUE IOHEXOL 300 MG/ML  SOLN COMPARISON:  Chest x-ray 02/25/2018 07/22/2017 FINDINGS: Cardiovascular: Heart size within normal limits. The right ventricle/left ventricle ratio estimated less than 1.0. No pericardial fluid/thickening. No significant coronary calcifications. No aortic valve calcifications. Normal course caliber and contour of the thoracic aorta. Mild atherosclerotic changes.  Branch vessels are patent. No periaortic fluid. Main pulmonary artery measures 2.9 cm. Filling defects within the segmental branches and subsegmental branches of the right lower lobe. Filling defects are mural and not central, with evidence of partial recanalization. There is associated hypoenhancing lung  parenchyma at the right lung base. On the left no filling defects identified, however, there are regions of hypo attenuating/enhancing parenchyma at the left lung base. Mediastinum/Nodes: Borderline enlarged lymph nodes of the mediastinum, predominantly of the lower paratracheal nodes and hilar nodes as well as subcarinal nodes. Index node on the right measures 13 mm at the hilum. AP window node measures 9 mm. Circumferential wall thickening of the distal esophagus. No endotracheal or central endobronchial debris. Unremarkable appearance of the thoracic inlet. Lungs/Pleura: Advanced centrilobular and paraseptal emphysema. Mixed consolidation and ground-glass opacity of the bilateral dependent lower lobes, more pronounced on the right complete collapse of the medial segments/posterior segment. Regions of hypoenhancement compatible with infarction. On the left there are regions of hypoenhancement in the costophrenic sulcus posteriorly. Multiple nodules of the left upper lobe and right upper lobe, in the outer 1/3 of the lung. These nodular changes were not present on the x-ray of 07/22/2017. No pleural effusion. Upper Abdomen: No acute finding Musculoskeletal: No acute displaced fracture. Mild degenerative changes of the thoracic spine. IMPRESSION: CT demonstrates evidence multifocal infection as well as pulmonary infarction in the setting of subacute/developing chronic pulmonary embolism: -the nodular and ground-glass changes of the lungs, predominantly right lower lobe, favored to represent multifocal infection given that these changes were not present on the chest x-ray of July 22, 2017. Because rapidly evolving metastatic disease is not excluded, follow-up contrast-enhanced chest CT is recommended once the patient has been treated in 2-3 months. -superimposed pulmonary infarction of the right greater than left lower lobes with associated atelectasis/volume loss. Mural based pulmonary embolism of the right lower  lobe segmental/subsegmental branches with partial recanalization, compatible with subacute to early chronic DVT. Circumferential distal esophageal thickening. This may be seen in the setting of longstanding GERD. Correlation with symptoms as well as potentially referral for upper endoscopy may be useful. Advanced emphysema.  Emphysema (ICD10-J43.9). Aortic Atherosclerosis (ICD10-I70.0). These results will be called to the ordering clinician or representative by the Radiologist Assistant, and communication documented in the PACS or zVision Dashboard. Electronically Signed   By: Corrie Mckusick D.O.   On: 03/19/2018 08:13     LOS: 1 day   Oren Binet, MD  [facility]  If 7PM-7AM, please contact night-coverage  Please page via [email or web address]-Password [email or web address]-click on MD name and type text message  03/20/2018, 10:12 AM

## 2018-03-21 LAB — CBC
HEMATOCRIT: 44.7 % (ref 39.0–52.0)
Hemoglobin: 14.1 g/dL (ref 13.0–17.0)
MCH: 29 pg (ref 26.0–34.0)
MCHC: 31.5 g/dL (ref 30.0–36.0)
MCV: 91.8 fL (ref 80.0–100.0)
Platelets: 323 10*3/uL (ref 150–400)
RBC: 4.87 MIL/uL (ref 4.22–5.81)
RDW: 12.3 % (ref 11.5–15.5)
WBC: 11.8 10*3/uL — ABNORMAL HIGH (ref 4.0–10.5)
nRBC: 0 % (ref 0.0–0.2)

## 2018-03-21 LAB — BASIC METABOLIC PANEL
Anion gap: 8 (ref 5–15)
BUN: 12 mg/dL (ref 6–20)
CO2: 26 mmol/L (ref 22–32)
Calcium: 9.2 mg/dL (ref 8.9–10.3)
Chloride: 104 mmol/L (ref 98–111)
Creatinine, Ser: 1.04 mg/dL (ref 0.61–1.24)
GFR calc non Af Amer: >60 mL/min (ref >60)
Glucose, Bld: 95 mg/dL (ref 70–99)
Potassium: 4.1 mmol/L (ref 3.5–5.1)
Sodium: 138 mmol/L (ref 135–145)

## 2018-03-21 LAB — HEPARIN LEVEL (UNFRACTIONATED): Heparin Unfractionated: 0.48 IU/mL (ref 0.30–0.70)

## 2018-03-21 MED ORDER — METOPROLOL SUCCINATE ER 25 MG PO TB24
25.0000 mg | ORAL_TABLET | Freq: Every day | ORAL | Status: DC
  Administered 2018-03-22: 25 mg via ORAL
  Filled 2018-03-21: qty 1

## 2018-03-21 MED ORDER — SACUBITRIL-VALSARTAN 24-26 MG PO TABS
1.0000 | ORAL_TABLET | Freq: Two times a day (BID) | ORAL | Status: DC
  Administered 2018-03-21 – 2018-03-22 (×3): 1 via ORAL
  Filled 2018-03-21 (×4): qty 1

## 2018-03-21 MED ORDER — RIVAROXABAN 15 MG PO TABS
15.0000 mg | ORAL_TABLET | Freq: Two times a day (BID) | ORAL | Status: DC
  Administered 2018-03-21 – 2018-03-22 (×3): 15 mg via ORAL
  Filled 2018-03-21 (×3): qty 1

## 2018-03-21 NOTE — Progress Notes (Signed)
SATURATION QUALIFICATIONS: (This note is used to comply with regulatory documentation for home oxygen)  Patient Saturations on Room Air at Rest = 88%  Patient Saturations on Room Air while Ambulating = 87%  Patient Saturations on 2 Liters of oxygen while Ambulating = 92%  Please briefly explain why patient needs home oxygen: pt did not get above 92% while ambulating

## 2018-03-21 NOTE — Consult Note (Addendum)
Reason for Consult: Cardiomyopathy Referring Physician: [facility]  Caleb Harper is an 57 y.o. male.  HPI:   57 y/o male with 30 PY tobacco abuse, emphysema, chronic back pain, admitted to [facility] with progressively worsening dyspnea and nonproductive cough and new right sided pleuritic chest pain. Patient had been treated outpatient with antibiotics and steroids for possible COPD exacerbation. He has also had 10-12 lb wt loss over last 2 months. Workup in this hospitalization showed mild leukocytosis, CT chest findings of subacute PE, right-sided pulmonary infarct, as well as new ground glass changes in both lungs Rt>Lt. There is concern for malignancy given his multifocal infiltrates and weight loss for which follow up imaging in 6-8 weeks, PET scan and CT guided biopsy of diffuse nodules is recommended by PCCM. He is now started on Xarelto for anticoagulation.   During the workup, his echocardiogram showed EF 35-40% with global hypokinesis. Cardiology were thus consulted. 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 He has had progressive dyspnea, as described above. He denies orthopnea, PND, leg edema, syncope. He endorses pleuritic right sided chest pain, but denies exertional angina. He endorses occasional lightheadedness episodes, but denies syncope. He has h/o heart disease in bother (Afib), and father (details not known).  Past Medical History:  Diagnosis Date  . Chronic back pain   . Emphysema lung ([place])   . Tobacco abuse     Past Surgical History:  Procedure Laterality Date  . BACK SURGERY      Family History  Problem Relation Age of Onset  . Cancer Mother   . COPD Father     Social History:  reports that he has been smoking. He does not have any smokeless tobacco history on file. He reports that he drinks alcohol. He reports that he does not use drugs.  Allergies: No Known Allergies  Medications: I have reviewed the  patient's current medications.  Results for orders placed or performed during the hospital encounter of 03/19/18 (from the past 48 hour(s))  Heparin level (unfractionated)     Status: None   Collection Time: 03/19/18  4:30 PM  Result Value Ref Range   Heparin Unfractionated 0.57 0.30 - 0.70 IU/mL    Comment: (NOTE) If heparin results are below expected values, and patient dosage has  been confirmed, suggest follow up testing of antithrombin III levels. Performed at [facility], [place] [address] [place], [place] [address]   HIV antibody (Routine Testing)     Status: None   Collection Time: 03/19/18  4:30 PM  Result Value Ref Range   HIV Screen 4th Generation wRfx Non Reactive Non Reactive    Comment: (NOTE) Performed At: [facility] [place], [place] [id number] Rush Farmer MD AS:[id number]   Heparin level (unfractionated)     Status: None   Collection Time: 03/19/18 11:18 PM  Result Value Ref Range   Heparin Unfractionated 0.48 0.30 - 0.70 IU/mL    Comment: (NOTE) If heparin results are below expected values, and patient dosage has  been confirmed, suggest follow up testing of antithrombin III levels. Performed at [facility], [place] [address] [place], [place] [address]   Heparin level (unfractionated)     Status: None   Collection Time: 03/20/18  5:49 AM  Result Value Ref Range   Heparin Unfractionated 0.40 0.30 - 0.70 IU/mL    Comment: (NOTE) If heparin results are below expected values, and patient  dosage has  been confirmed, suggest follow up testing of antithrombin III levels. Performed at [facility], [place] [address], [place] [address]   CBC     Status: Abnormal   Collection Time: 03/20/18  5:49 AM  Result Value Ref Range   WBC 13.3 (H) 4.0 - 10.5 K/uL   RBC 4.77 4.22 - 5.81 MIL/uL   Hemoglobin 13.6 13.0 - 17.0 g/dL   HCT 43.4 39.0 - 52.0 %   MCV 91.0 80.0 - 100.0 fL   MCH 28.5 26.0 - 34.0 pg   MCHC 31.3 30.0  - 36.0 g/dL   RDW 12.4 11.5 - 15.5 %   Platelets 298 150 - 400 K/uL   nRBC 0.0 0.0 - 0.2 %    Comment: Performed at [facility], [place] [address], [place], [place] [address]  Basic metabolic panel     Status: None   Collection Time: 03/20/18  5:49 AM  Result Value Ref Range   Sodium 135 135 - 145 mmol/L   Potassium 3.9 3.5 - 5.1 mmol/L   Chloride 101 98 - 111 mmol/L   CO2 24 22 - 32 mmol/L   Glucose, Bld 93 70 - 99 mg/dL   BUN 10 6 - 20 mg/dL   Creatinine, Ser 0.91 0.61 - 1.24 mg/dL   Calcium 8.9 8.9 - 10.3 mg/dL   GFR calc non Af Amer >60 >60 mL/min   GFR calc Af Amer >60 >60 mL/min   Anion gap 10 5 - 15    Comment: Performed at [facility] [address] [place], [place] [address]  Heparin level (unfractionated)     Status: None   Collection Time: 03/21/18  4:37 AM  Result Value Ref Range   Heparin Unfractionated 0.48 0.30 - 0.70 IU/mL    Comment: (NOTE) If heparin results are below expected values, and patient dosage has  been confirmed, suggest follow up testing of antithrombin III levels. Performed at [facility], [place] [address], [place], [place] [address]   CBC     Status: Abnormal   Collection Time: 03/21/18  4:37 AM  Result Value Ref Range   WBC 11.8 (H) 4.0 - 10.5 K/uL   RBC 4.87 4.22 - 5.81 MIL/uL   Hemoglobin 14.1 13.0 - 17.0 g/dL   HCT 44.7 39.0 - 52.0 %   MCV 91.8 80.0 - 100.0 fL   MCH 29.0 26.0 - 34.0 pg   MCHC 31.5 30.0 - 36.0 g/dL   RDW 12.3 11.5 - 15.5 %   Platelets 323 150 - 400 K/uL   nRBC 0.0 0.0 - 0.2 %    Comment: Performed at [facility], [place] [address] [place], [place] [address]  Basic metabolic panel     Status: None   Collection Time: 03/21/18  4:37 AM  Result Value Ref Range   Sodium 138 135 - 145 mmol/L   Potassium 4.1 3.5 - 5.1 mmol/L   Chloride 104 98 - 111 mmol/L   CO2 26 22 - 32 mmol/L   Glucose, Bld 95 70 - 99 mg/dL   BUN 12 6 - 20 mg/dL   Creatinine, Ser 1.04 0.61 - 1.24 mg/dL   Calcium 9.2 8.9 - 10.3  mg/dL   GFR calc non Af Amer >60 >60 mL/min   GFR calc Af Amer >60 >60 mL/min   Anion gap 8 5 - 15    Comment: Performed at [facility], [address] [place], [place] [address]    Vas [place] Lower Extremity Venous (dvt)  Result Date: 03/20/2018  Lower Venous Study Risk Factors: Confirmed PE Probable New lung cancer. Comparison Study: No prior study on file for comparison Performing Technologist: Sharion Dove RVS  Examination Guidelines: A complete evaluation includes B-mode imaging, spectral Doppler, color Doppler, and power Doppler as needed of all accessible portions of each vessel. Bilateral testing is considered an integral part of a complete examination. Limited examinations for reoccurring indications may be performed as noted.    Summary: Right: There is no evidence of deep vein thrombosis in the lower extremity. Left: There is no evidence of deep vein thrombosis in the lower extremity.  *See table(s) above for measurements and observations. Electronically signed by Servando Snare MD on 03/20/2018 at 6:00:32 PM.      Cardiac studies:  EKG 03/19/2018: Sinus rhythm 93 bpm. Normal axis. Normal conduction. LVH. Biatrial enlargement. [facility] echocardiogram 03/20/2018: - Left ventricle: The cavity size was normal. Wall thickness was   normal. Systolic function was moderately reduced. The estimated   ejection fraction was in the range of 35% to 40%. Diffuse   hypokinesis. The study is not technically sufficient to allow   evaluation of LV diastolic function. - Mitral valve: Mildly thickened leaflets . There was trivial   regurgitation. - Left atrium: The atrium was normal in size. - Right ventricle: The cavity size was mildly dilated. Mildy   reduced systolic function. - Tricuspid valve: There was mild regurgitation. - Pulmonary arteries: PA peak pressure: 39 mm Hg (S). - Inferior vena cava: The vessel was normal in size. The   respirophasic diameter changes were in the  normal range (= 50%),   consistent with normal central venous pressure.  Impressions:  - LVEF 35-40%, normal wall thickness, severe global hypokinesis,   trivial MR, normal LA size, mildly dilated RV with mild systolic   dysfunction, mild TR, RVSP 39 mmHg, normal IVC.  Review of Systems  Constitutional: Positive for malaise/fatigue and weight loss.  HENT: Negative.   Eyes: Negative.   Respiratory: Positive for cough and shortness of breath. Negative for hemoptysis, sputum production and wheezing.   Cardiovascular: Positive for chest pain (Pleuritic). Negative for orthopnea, claudication, leg swelling and PND.  Gastrointestinal: Negative for abdominal pain.  Genitourinary: Negative.   Musculoskeletal: Negative.   Neurological: Negative for loss of consciousness.  Psychiatric/Behavioral: Negative.   All other systems reviewed and are negative.  Blood pressure 109/69, pulse 76, temperature (!) 97.5 F (36.4 C), temperature source Oral, resp. rate 16, height 5\' 9"  (1.753 m), weight 56.7 kg, SpO2 93 %. Physical Exam  Nursing note and vitals reviewed. Constitutional: He is oriented to person, place, and time. He appears well-developed and well-nourished. No distress.  Cachectic  HENT:  Head: Normocephalic and atraumatic.  Eyes: Pupils are equal, round, and reactive to light. Conjunctivae are normal.  Neck: No JVD present.  Cardiovascular: Normal rate, regular rhythm and normal heart sounds.  No murmur heard. Respiratory: Effort normal and breath sounds normal. He has no wheezes.  Bibasilar coarse rales   GI: Soft. Bowel sounds are normal. There is no tenderness.  Musculoskeletal: He exhibits no edema.  Lymphadenopathy:    He has no cervical adenopathy.  Neurological: He is alert and oriented to person, place, and time.  Skin: Skin is warm and dry.  Psychiatric: He has a normal mood and affect.    Assessment/Recommendations: 57 y/o male with 30 PY tobacco  abuse, emphysema,  chronic back pain, now with prolonged cough, dyspnea, imaging changes concerning for possible malignancy, and new diagnosis of HFrEF  HFrEF: NYHA class II. Etiology remains broad.  Recommend switching metoprolol tartarate 25 mg bid to metoprolol succinate 25 mg daily. Start Entresto 24-26 mg bid.  Recommend noninvasive ischemic workup. I will arrange this outpatient should the patient be discharged soon. From long term prognosis standpoint, I am more concerend about his pulmonary findigns, than his new diagnosis of cardiomyopathy.  Subacute PE, ground glass changes: Underlying malignancy possible. Follow up and workup as per PCCM recommendations. Agree with Xarelto for anticoagulation.   Caleb Harper 03/21/2018, 10:44 AM   Olivia Lopez de Gutierrez, MD [facility] Pager: [phone] Office: [phone] If no answer Cell [phone]

## 2018-03-21 NOTE — Progress Notes (Signed)
PROGRESS NOTE        PATIENT DETAILS Name: Caleb Harper Age: 57 y.o. Sex: male Date of Birth: [birth year] Admit Date: 03/19/2018 Admitting Physician [place] Shi, MD AOZ:[id number], L.Marlou Sa, MD  Brief Narrative: Patient is a 57 y.o. male with history of chronic back pain, tobacco use-recently quit-presenting with cough and shortness of breath.  Found to have pulmonary embolism and multiple nodular and groundglass opacities in bilateral lungs.  Started on IV heparin-and empiric antimicrobial therapy and admitted to the hospitalist service.  See below for further details.  Subjective: Shortness of breath continues to improve.  Denies any chest pain.  Denies any nausea vomiting.  Assessment/Plan: Pulmonary embolism: Improved with anticoagulation-no clinical features suggestive of RV strain.  Lower extremity Dopplers negative for DVT.  TTE shows mildly reduced RV function.  Stop heparin and transition to Xarelto.  Patient does have lung nodules-and if upon further work-up is demonstrated to be malignant-this probably is the cause for his pulmonary embolism.  Chronic systolic heart failure (EF 35-40%): This is a new finding-seen incidentally on transthoracic echocardiogram.  No excess volume on exam.  Cardiology planning on outpatient work-up-continue metoprolol-cardiology has started on Entresto.  Pneumonia versus lung malignancy: See CT chest findings-patient is afebrile-leukocytosis is essentially resolved-he already has completed 3 rounds of antibiotics as an outpatient.  Since clinically improving with anticoagulation-continue to monitor off antimicrobial therapy.  Procalcitonin also not elevated.    Multiple lung nodules: Given history of smoking-concern for lung malignancy-however due to pulmonary embolism-probably needs anticoagulation for a few weeks before consideration of any sort of biopsy including bronchoscopy.  Evaluated by pulmonology on  11/27-recommendations are for outpatient PET scan-and possibly CT-guided biopsy.  Long discussion with patient-he is agreeable to proceed to outpatient work-up at the discretion of his PCP.  Tobacco abuse: Apparently quit 3 weeks back.  Continue counseling  Chronic back pain: Continue as needed narcotics  DVT Prophylaxis: Xarelto  Code Status: Full code  Family Communication: None at bedside  Disposition Plan: Remain inpatient-home over the weekend if clinical improvement continues.  Antimicrobial agents: Anti-infectives (From admission, onward)   None     Procedures: None  CONSULTS:  pulmonary/intensive care  Time spent: 25- minutes-Greater than 50% of this time was spent in counseling, explanation of diagnosis, planning of further management, and coordination of care.  MEDICATIONS: Scheduled Meds: . [START ON 03/22/2018] metoprolol succinate  25 mg Oral Daily  . multivitamin with minerals  1 tablet Oral Daily  . pantoprazole  40 mg Oral Daily  . Rivaroxaban  15 mg Oral BID WC  . sacubitril-valsartan  1 tablet Oral BID  . vitamin C  500 mg Oral Daily   Continuous Infusions:  PRN Meds:.acetaminophen **OR** acetaminophen, hydrALAZINE, ipratropium-albuterol, ondansetron **OR** ondansetron (ZOFRAN) IV, oxyCODONE, polyethylene glycol   PHYSICAL EXAM: Vital signs: Vitals:   03/20/18 1331 03/20/18 2225 03/21/18 0535 03/21/18 0846  BP: 111/77 106/71 112/78 109/69  Pulse: (!) 57 (!) 55 (!) 54 76  Resp: 18 14 16    Temp: 98 F (36.7 C) 98.2 F (36.8 C) (!) 97.5 F (36.4 C)   TempSrc: Oral Oral Oral   SpO2: 95% 95% 93%   Weight:      Height:       Filed Weights   03/19/18 0939  Weight: 56.7 kg   Body mass index is 18.46 kg/m.  General appearance:Awake, alert, not in any distress.  Eyes:no scleral icterus. HEENT: Atraumatic and Normocephalic Neck: supple, no JVD. Resp:Good air entry bilaterally,no rales or rhonchi CVS: S1 S2 regular, no murmurs.  GI:  Bowel sounds present, Non tender and not distended with no gaurding, rigidity or rebound. Extremities: B/L Lower Ext shows no edema, both legs are warm to touch Neurology:  Non focal Psychiatric: Normal judgment and insight. Normal mood. Musculoskeletal:No digital cyanosis Skin:No Rash, warm and dry Wounds:N/A  I have personally reviewed following labs and imaging studies  LABORATORY DATA: CBC: Recent Labs  Lab 03/19/18 0946 03/20/18 0549 03/21/18 0437  WBC 15.0* 13.3* 11.8*  NEUTROABS 10.2*  --   --   HGB 14.7 13.6 14.1  HCT 46.2 43.4 44.7  MCV 93.5 91.0 91.8  PLT 325 298 621    Basic Metabolic Panel: Recent Labs  Lab 03/19/18 0946 03/20/18 0549 03/21/18 0437  NA 136 135 138  K 3.9 3.9 4.1  CL 102 101 104  CO2 23 24 26   GLUCOSE 115* 93 95  BUN 12 10 12   CREATININE 1.03 0.91 1.04  CALCIUM 9.8 8.9 9.2    GFR: Estimated Creatinine Clearance: 63.6 mL/min (by C-G formula based on SCr of 1.04 mg/dL).  Liver Function Tests: Recent Labs  Lab 03/19/18 0946  AST 19  ALT 13  ALKPHOS 130*  BILITOT 0.6  PROT 7.6  ALBUMIN 3.5   No results for input(s): LIPASE, AMYLASE in the last 168 hours. No results for input(s): AMMONIA in the last 168 hours.  Coagulation Profile: No results for input(s): INR, PROTIME in the last 168 hours.  Cardiac Enzymes: Recent Labs  Lab 03/19/18 0946  TROPONINI <0.03    BNP (last 3 results) No results for input(s): PROBNP in the last 8760 hours.  HbA1C: No results for input(s): HGBA1C in the last 72 hours.  CBG: No results for input(s): GLUCAP in the last 168 hours.  Lipid Profile: No results for input(s): CHOL, HDL, LDLCALC, TRIG, CHOLHDL, LDLDIRECT in the last 72 hours.  Thyroid Function Tests: No results for input(s): TSH, T4TOTAL, FREET4, T3FREE, THYROIDAB in the last 72 hours.  Anemia Panel: No results for input(s): VITAMINB12, FOLATE, FERRITIN, TIBC, IRON, RETICCTPCT in the last 72 hours.  Urine analysis: No  results found for: COLORURINE, APPEARANCEUR, LABSPEC, PHURINE, GLUCOSEU, HGBUR, BILIRUBINUR, KETONESUR, PROTEINUR, UROBILINOGEN, NITRITE, LEUKOCYTESUR  Sepsis Labs: Lactic Acid, Venous No results found for: LATICACIDVEN  MICROBIOLOGY: No results found for this or any previous visit (from the past 240 hour(s)).  RADIOLOGY STUDIES/RESULTS: Dg Chest 2 View  Result Date: 02/25/2018 CLINICAL DATA:  Follow-up examination for pneumonia. Shortness of breath with productive cough. EXAM: CHEST - 2 VIEW COMPARISON:  Prior radiograph from 07/22/2017. FINDINGS: Cardiac and mediastinal silhouettes are stable in size and contour, and remain within normal limits. Lungs normally inflated. Patchy multifocal infiltrates involving the left upper and lower lobes, concerning for multifocal infiltrates. Right basilar opacity that partially silhouettes the right heart border consistent with right middle lobe atelectasis, although superimposed infection would be difficult to exclude. Right minor fissure displaced inferiorly. No pulmonary edema or pleural effusion. No pneumothorax. No acute osseous abnormality. IMPRESSION: 1. Patchy multifocal opacities involving the left upper and lower lobes, concerning for persistent multifocal infection/pneumonia. 2. Additional patchy right basilar opacity, favored to in large part reflect right middle lobe atelectasis, although superimposed infiltrate within this region would be difficult to exclude. Electronically Signed   By: Jeannine Boga M.D.   On: 02/25/2018 15:57  Ct Chest W Contrast  Result Date: 03/19/2018 CLINICAL DATA:  57 year old male with a history of persisting pneumonia for 2 months EXAM: CT CHEST WITH CONTRAST TECHNIQUE: Multidetector CT imaging of the chest was performed during intravenous contrast administration. CONTRAST:  52mL OMNIPAQUE IOHEXOL 300 MG/ML  SOLN COMPARISON:  Chest x-ray 02/25/2018 07/22/2017 FINDINGS: Cardiovascular: Heart size within normal  limits. The right ventricle/left ventricle ratio estimated less than 1.0. No pericardial fluid/thickening. No significant coronary calcifications. No aortic valve calcifications. Normal course caliber and contour of the thoracic aorta. Mild atherosclerotic changes. Branch vessels are patent. No periaortic fluid. Main pulmonary artery measures 2.9 cm. Filling defects within the segmental branches and subsegmental branches of the right lower lobe. Filling defects are mural and not central, with evidence of partial recanalization. There is associated hypoenhancing lung parenchyma at the right lung base. On the left no filling defects identified, however, there are regions of hypo attenuating/enhancing parenchyma at the left lung base. Mediastinum/Nodes: Borderline enlarged lymph nodes of the mediastinum, predominantly of the lower paratracheal nodes and hilar nodes as well as subcarinal nodes. Index node on the right measures 13 mm at the hilum. AP window node measures 9 mm. Circumferential wall thickening of the distal esophagus. No endotracheal or central endobronchial debris. Unremarkable appearance of the thoracic inlet. Lungs/Pleura: Advanced centrilobular and paraseptal emphysema. Mixed consolidation and ground-glass opacity of the bilateral dependent lower lobes, more pronounced on the right complete collapse of the medial segments/posterior segment. Regions of hypoenhancement compatible with infarction. On the left there are regions of hypoenhancement in the costophrenic sulcus posteriorly. Multiple nodules of the left upper lobe and right upper lobe, in the outer 1/3 of the lung. These nodular changes were not present on the x-ray of 07/22/2017. No pleural effusion. Upper Abdomen: No acute finding Musculoskeletal: No acute displaced fracture. Mild degenerative changes of the thoracic spine. IMPRESSION: CT demonstrates evidence multifocal infection as well as pulmonary infarction in the setting of  subacute/developing chronic pulmonary embolism: -the nodular and ground-glass changes of the lungs, predominantly right lower lobe, favored to represent multifocal infection given that these changes were not present on the chest x-ray of July 22, 2017. Because rapidly evolving metastatic disease is not excluded, follow-up contrast-enhanced chest CT is recommended once the patient has been treated in 2-3 months. -superimposed pulmonary infarction of the right greater than left lower lobes with associated atelectasis/volume loss. Mural based pulmonary embolism of the right lower lobe segmental/subsegmental branches with partial recanalization, compatible with subacute to early chronic DVT. Circumferential distal esophageal thickening. This may be seen in the setting of longstanding GERD. Correlation with symptoms as well as potentially referral for upper endoscopy may be useful. Advanced emphysema.  Emphysema (ICD10-J43.9). Aortic Atherosclerosis (ICD10-I70.0). These results will be called to the ordering clinician or representative by the Radiologist Assistant, and communication documented in the PACS or zVision Dashboard. Electronically Signed   By: Corrie Mckusick D.O.   On: 03/19/2018 08:13   Vas [place] Lower Extremity Venous (dvt)  Result Date: 03/20/2018  Lower Venous Study Risk Factors: Confirmed PE Probable New lung cancer. Comparison Study: No prior study on file for comparison Performing Technologist: Sharion Dove RVS  Examination Guidelines: A complete evaluation includes B-mode imaging, spectral Doppler, color Doppler, and power Doppler as needed of all accessible portions of each vessel. Bilateral testing is considered an integral part of a complete examination. Limited examinations for reoccurring indications may be performed as noted.  Right Venous Findings: +---------+---------------+---------+-----------+----------+-------+  CompressibilityPhasicitySpontaneityPropertiesSummary  +---------+---------------+---------+-----------+----------+-------+ CFV      Full           Yes                                   +---------+---------------+---------+-----------+----------+-------+ SFJ      Full                                                 +---------+---------------+---------+-----------+----------+-------+ FV Prox  Full                                                 +---------+---------------+---------+-----------+----------+-------+ FV Mid   Full                                                 +---------+---------------+---------+-----------+----------+-------+ FV DistalFull                                                 +---------+---------------+---------+-----------+----------+-------+ PFV      Full                                                 +---------+---------------+---------+-----------+----------+-------+ POP      Full           Yes      Yes                          +---------+---------------+---------+-----------+----------+-------+ PTV      Full                                                 +---------+---------------+---------+-----------+----------+-------+ PERO     Full                                                 +---------+---------------+---------+-----------+----------+-------+  Left Venous Findings: +---------+---------------+---------+-----------+----------+-------+          CompressibilityPhasicitySpontaneityPropertiesSummary +---------+---------------+---------+-----------+----------+-------+ CFV      Full           Yes      Yes                          +---------+---------------+---------+-----------+----------+-------+ SFJ      Full                                                 +---------+---------------+---------+-----------+----------+-------+  FV Prox  Full                                                  +---------+---------------+---------+-----------+----------+-------+ FV Mid   Full                                                 +---------+---------------+---------+-----------+----------+-------+ FV DistalFull                                                 +---------+---------------+---------+-----------+----------+-------+ PFV      Full                                                 +---------+---------------+---------+-----------+----------+-------+ POP      Full           Yes      Yes                          +---------+---------------+---------+-----------+----------+-------+ PTV      Full                                                 +---------+---------------+---------+-----------+----------+-------+ PERO     Full                                                 +---------+---------------+---------+-----------+----------+-------+    Summary: Right: There is no evidence of deep vein thrombosis in the lower extremity. Left: There is no evidence of deep vein thrombosis in the lower extremity.  *See table(s) above for measurements and observations. Electronically signed by Servando Snare MD on 03/20/2018 at 6:00:32 PM.    Final      LOS: 2 days   Oren Binet, MD  [facility]  If 7PM-7AM, please contact night-coverage  Please page via [email or web address]-Password [email or web address]-click on MD name and type text message  03/21/2018, 1:01 PM

## 2018-03-21 NOTE — Progress Notes (Signed)
ANTICOAGULATION CONSULT NOTE  Pharmacy Consult:  Heparin --> Xarelto Indication: pulmonary embolus  No Known Allergies  Patient Measurements: Height: 5\' 9"  (175.3 cm) Weight: 125 lb (56.7 kg) IBW/kg (Calculated) : 70.7 Heparin Dosing Weight: 57 kg  Vital Signs: Temp: 97.5 F (36.4 C) (11/29 0535) Temp Source: Oral (11/29 0535) BP: 109/69 (11/29 0846) Pulse Rate: 76 (11/29 0846)  Labs: Recent Labs    03/19/18 0946  03/19/18 2318 03/20/18 0549 03/21/18 0437  HGB 14.7  --   --  13.6 14.1  HCT 46.2  --   --  43.4 44.7  PLT 325  --   --  298 323  HEPARINUNFRC  --    < > 0.48 0.40 0.48  CREATININE 1.03  --   --  0.91 1.04  TROPONINI <0.03  --   --   --   --    < > = values in this interval not displayed.    Estimated Creatinine Clearance: 63.6 mL/min (by C-G formula based on SCr of 1.04 mg/dL).    Assessment: 46 YOM presenting with CP/SOB, PE on CT. LE doppler neg for DVT. Pharmacy consulted to transition from IV heparin to Xarelto.  No AC PTA.  No bleeding noted, CBC is stable. Renal function is normal.  Plan:  Discontinue heparin drip Xarelto 15 mg PO bid with meals for 21 days then 20 mg PO daily with supper Pharmacy signing off but will continue to follow peripherally Education prior to discharge  Thanks for allowing pharmacy to be a part of this patient's care.  Renold Genta, PharmD, BCPS Clinical Pharmacist Clinical phone for 03/21/2018 until 3p is [phone] 03/21/2018 9:52 AM  **Pharmacist phone directory can now be found on [email or web address] listed under [place]**

## 2018-03-21 NOTE — Care Management (Signed)
Xarelto benefits check sent and pending. CM team will f/u and provide the Xarelto card and discuss est monthly co-pay cost once determined.  Midge Minium RN, BSN, NCM-BC, ACM-RN [phone]

## 2018-03-22 DIAGNOSIS — I5022 Chronic systolic (congestive) heart failure: Secondary | ICD-10-CM

## 2018-03-22 DIAGNOSIS — J9601 Acute respiratory failure with hypoxia: Secondary | ICD-10-CM

## 2018-03-22 DIAGNOSIS — J449 Chronic obstructive pulmonary disease, unspecified: Secondary | ICD-10-CM

## 2018-03-22 MED ORDER — ALBUTEROL SULFATE HFA 108 (90 BASE) MCG/ACT IN AERS: 2.0000 | INHALATION_SPRAY | RESPIRATORY_TRACT | 0 refills | Status: DC | PRN

## 2018-03-22 MED ORDER — SACUBITRIL-VALSARTAN 24-26 MG PO TABS: 1.0000 | ORAL_TABLET | Freq: Two times a day (BID) | ORAL | 0 refills | Status: DC

## 2018-03-22 MED ORDER — METOPROLOL SUCCINATE ER 25 MG PO TB24: 25.0000 mg | ORAL_TABLET | Freq: Every day | ORAL | 0 refills | Status: AC

## 2018-03-22 MED ORDER — TIOTROPIUM BROMIDE MONOHYDRATE 18 MCG IN CAPS: 18.0000 ug | ORAL_CAPSULE | Freq: Every day | RESPIRATORY_TRACT | 0 refills | Status: DC

## 2018-03-22 MED ORDER — RIVAROXABAN (XARELTO) VTE STARTER PACK (15 & 20 MG): ORAL_TABLET | ORAL | 0 refills | Status: DC

## 2018-03-22 MED ORDER — PANTOPRAZOLE SODIUM 40 MG PO TBEC: 40.0000 mg | DELAYED_RELEASE_TABLET | Freq: Every day | ORAL | 0 refills | Status: DC

## 2018-03-22 NOTE — Discharge Summary (Signed)
PATIENT DETAILS Name: Caleb Harper Age: 57 y.o. Sex: male Date of Birth: Jul 08, 1960 MRN: [id number]. Admitting Physician: Guilford Shi, MD LFY:[id number], L.Marlou Sa, MD  Admit Date: 03/19/2018 Discharge date: 03/22/2018  Recommendations for Outpatient Follow-up:  1. Follow up with PCP in 1-2 weeks 2. Please obtain BMP/CBC in one week 3. Please ensure follow up with cardiology and pulmonology 4. Has numerous lung nodules-see below 5. Needs outpatient PFTs  6. Being discharged on home O2  Admitted From:  Home  Disposition: [place]: No  Equipment/Devices: None  Discharge Condition: Stable  CODE STATUS: FULL CODE  Diet recommendation:  Heart Healthy  Brief Summary: See H&P, Labs, Consult and Test reports for all details in brief, Patient is a 57 y.o. male with history of chronic back pain, tobacco use-recently quit-presenting with cough and shortness of breath.  Found to have pulmonary embolism and multiple nodular and groundglass opacities in bilateral lungs.  Started on IV heparin- and admitted to the hospitalist service.  See below for further details.  Brief Hospital Course: Pulmonary embolism: Improved with anticoagulation-no clinical features suggestive of RV strain.  Lower extremity Dopplers negative for DVT.  TTE shows mildly reduced RV function.    Initially on IV heparin-has now been transitioned to Xarelto.  Patient does have lung nodules-and if upon further work-up is demonstrated to be malignant-this probably is the cause for his pulmonary embolism.  Needs long-term anticoagulation-best to refer patient to hematology in the outpatient setting to determine appropriate duration.  Acute hypoxic respiratory failure: I suspect this is secondary to pulmonary embolism and multiple lung nodules.  It appears that he may have CT evidence of emphysema as well that is contributing.  He qualifies for home O2-this will be arranged on discharge.  Due to concern  for underlying undiagnosed COPD-have started him on Spiriva and as needed albuterol inhaler.  He probably needs outpatient PFTs.  Chronic systolic heart failure (EF 35-40%): This is a new finding-seen incidentally on transthoracic echocardiogram.  No excess volume on exam.  Continue metoprolol and Entresto.  Cardiology following and further work-up is planned for the outpatient setting.   Pneumonia versus lung malignancy: See CT chest findings-patient is afebrile-leukocytosis is essentially resolved-he already has completed 3 rounds of antibiotics as an outpatient (last was 2 weeks back).  Patient has clinically improved without any antimicrobial therapy-prudent to continue to observe without initiation of antimicrobial therapy.  Procalcitonin was not elevated.  Seen by pulmonology-recommendations are for outpatient PET scan-and possibly biopsy once patient is adequately treated with anticoagulation.  Multiple lung nodules: Given history of smoking-concern for lung malignancy-however due to pulmonary embolism-probably needs anticoagulation for a few weeks before consideration of any sort of biopsy including bronchoscopy.  Evaluated by pulmonology on 11/27-recommendations are for outpatient PET scan-and possibly CT-guided biopsy.  Long discussion with patient-he is agreeable to proceed to outpatient work-up at the discretion of his PCP.  Patient instructed to call pulmonology clinic this coming Monday (currently office closed for the holiday weekend)-and get an appointment.  Patient aware of risk of malignancy in the setting.  COPD: Has CT evidence of emphysema-probably contributing to shortness of breath and hypoxia.  Have started patient on Spiriva and as needed albuterol.  Please pursue outpatient PFTs.  Tobacco abuse: Apparently quit 3 weeks back.  Continue counseling  Chronic back pain: Continue as needed narcotics  Procedures/Studies: None  Discharge Diagnoses:  Active Problems:   Acute  pulmonary embolism [facility])   Discharge Instructions:  Activity:  As tolerated with Full fall precautions use walker/cane & assistance as needed   Discharge Instructions    Call MD for:  difficulty breathing, headache or visual disturbances   Complete by:  As directed    Call MD for:  persistant nausea and vomiting   Complete by:  As directed    Call MD for:  severe uncontrolled pain   Complete by:  As directed    Diet - low sodium heart healthy   Complete by:  As directed    Discharge instructions   Complete by:  As directed    Follow with Primary MD  [name], L.Marlou Sa, MD in 1 week  You need to follow with Pulmonology Clinic for further work up of these lung nodules-in some cases these lung nodules are cancerous  Continue follow with Cardiology-Dr Patwardan  Please get a complete blood count and chemistry panel checked by your Primary MD at your next visit, and again as instructed by your Primary MD.  Get Medicines reviewed and adjusted: Please take all your medications with you for your next visit with your Primary MD  Laboratory/radiological data: Please request your Primary MD to go over all hospital tests and procedure/radiological results at the follow up, please ask your Primary MD to get all Hospital records sent to his/her office.  In some cases, they will be blood work, cultures and biopsy results pending at the time of your discharge. Please request that your primary care M.D. follows up on these results.  Also Note the following: If you experience worsening of your admission symptoms, develop shortness of breath, life threatening emergency, suicidal or homicidal thoughts you must seek medical attention immediately by calling 911 or calling your MD immediately  if symptoms less severe.  You must read complete instructions/literature along with all the possible adverse reactions/side effects for all the Medicines you take and that have been prescribed to you. Take any  new Medicines after you have completely understood and accpet all the possible adverse reactions/side effects.   Do not drive when taking Pain medications or sleeping medications (Benzodaizepines)  Do not take more than prescribed Pain, Sleep and Anxiety Medications. It is not advisable to combine anxiety,sleep and pain medications without talking with your primary care practitioner  Special Instructions: If you have smoked or chewed Tobacco  in the last 2 yrs please stop smoking, stop any regular Alcohol  and or any Recreational drug use.  Wear Seat belts while driving.  Please note: You were cared for by a hospitalist during your hospital stay. Once you are discharged, your primary care physician will handle any further medical issues. Please note that NO REFILLS for any discharge medications will be authorized once you are discharged, as it is imperative that you return to your primary care physician (or establish a relationship with a primary care physician if you do not have one) for your post hospital discharge needs so that they can reassess your need for medications and monitor your lab values.   Increase activity slowly   Complete by:  As directed      Allergies as of 03/22/2018   No Known Allergies     Medication List    STOP taking these medications   ibuprofen 800 MG tablet Commonly known as:  ADVIL,MOTRIN     TAKE these medications   albuterol 108 (90 Base) MCG/ACT inhaler Commonly known as:  PROVENTIL HFA;VENTOLIN HFA Inhale 2 puffs into the lungs every 4 (four) hours as needed for wheezing  or shortness of breath.   metoprolol succinate 25 MG 24 hr tablet Commonly known as:  TOPROL-XL Take 1 tablet (25 mg total) by mouth daily. Start taking on:  03/23/2018   oxyCODONE-acetaminophen 5-325 MG tablet Commonly known as:  PERCOCET/ROXICET Take 1-2 tablets by mouth every 6 (six) hours as needed for moderate pain.   pantoprazole 40 MG tablet Commonly known as:   PROTONIX Take 1 tablet (40 mg total) by mouth daily. Start taking on:  03/23/2018   Rivaroxaban 15 & 20 MG Tbpk Take as directed on package: Start with one 15mg  tablet by mouth twice a day with food for 39 more doses. On Day 22 (ie 12/19), switch to one 20mg  tablet once a day with food.   sacubitril-valsartan 24-26 MG Commonly known as:  ENTRESTO Take 1 tablet by mouth 2 (two) times daily.   tiotropium 18 MCG inhalation capsule Commonly known as:  SPIRIVA Place 1 capsule (18 mcg total) into inhaler and inhale daily.            Durable Medical Equipment  (From admission, onward)         Start     Ordered   03/22/18 0713  For home use only DME oxygen  Once    Question Answer Comment  Mode or (Route) Nasal cannula   Liters per Minute 2   Frequency Continuous (stationary and portable oxygen unit needed)   Oxygen conserving device No   Oxygen delivery system Gas      03/22/18 0712         Follow-up Information    [facility]. Call.   Specialty:  Pulmonology Why:  Call to make an appointment for hospital follow up.  You will need to be seen 1-2 weeks post discharge.  Contact information: [address] [place] [address] [phone]       Nigel Mormon, MD Follow up.   Specialty:  Cardiology Why:  Will arrange outpatient stress test and follow up. Contact information: [place] [address] [phone]        [name], L.Marlou Sa, MD. Schedule an appointment as soon as possible for a visit in 1 week(s).   Specialty:  Family Medicine Contact information: [address] [place] [address] [phone]          No Known Allergies  Consultations:   pulmonary/intensive care  Other Procedures/Studies: Dg Chest 2 View  Result Date: 02/25/2018 CLINICAL DATA:  Follow-up examination for pneumonia. Shortness of breath with productive cough. EXAM: CHEST - 2 VIEW COMPARISON:   Prior radiograph from 07/22/2017. FINDINGS: Cardiac and mediastinal silhouettes are stable in size and contour, and remain within normal limits. Lungs normally inflated. Patchy multifocal infiltrates involving the left upper and lower lobes, concerning for multifocal infiltrates. Right basilar opacity that partially silhouettes the right heart border consistent with right middle lobe atelectasis, although superimposed infection would be difficult to exclude. Right minor fissure displaced inferiorly. No pulmonary edema or pleural effusion. No pneumothorax. No acute osseous abnormality. IMPRESSION: 1. Patchy multifocal opacities involving the left upper and lower lobes, concerning for persistent multifocal infection/pneumonia. 2. Additional patchy right basilar opacity, favored to in large part reflect right middle lobe atelectasis, although superimposed infiltrate within this region would be difficult to exclude. Electronically Signed   By: Jeannine Boga M.D.   On: 02/25/2018 15:57   Ct Chest W Contrast  Result Date: 03/19/2018 CLINICAL DATA:  57 year old male with a  history of persisting pneumonia for 2 months EXAM: CT CHEST WITH CONTRAST TECHNIQUE: Multidetector CT imaging of the chest was performed during intravenous contrast administration. CONTRAST:  44mL OMNIPAQUE IOHEXOL 300 MG/ML  SOLN COMPARISON:  Chest x-ray 02/25/2018 07/22/2017 FINDINGS: Cardiovascular: Heart size within normal limits. The right ventricle/left ventricle ratio estimated less than 1.0. No pericardial fluid/thickening. No significant coronary calcifications. No aortic valve calcifications. Normal course caliber and contour of the thoracic aorta. Mild atherosclerotic changes. Branch vessels are patent. No periaortic fluid. Main pulmonary artery measures 2.9 cm. Filling defects within the segmental branches and subsegmental branches of the right lower lobe. Filling defects are mural and not central, with evidence of partial  recanalization. There is associated hypoenhancing lung parenchyma at the right lung base. On the left no filling defects identified, however, there are regions of hypo attenuating/enhancing parenchyma at the left lung base. Mediastinum/Nodes: Borderline enlarged lymph nodes of the mediastinum, predominantly of the lower paratracheal nodes and hilar nodes as well as subcarinal nodes. Index node on the right measures 13 mm at the hilum. AP window node measures 9 mm. Circumferential wall thickening of the distal esophagus. No endotracheal or central endobronchial debris. Unremarkable appearance of the thoracic inlet. Lungs/Pleura: Advanced centrilobular and paraseptal emphysema. Mixed consolidation and ground-glass opacity of the bilateral dependent lower lobes, more pronounced on the right complete collapse of the medial segments/posterior segment. Regions of hypoenhancement compatible with infarction. On the left there are regions of hypoenhancement in the costophrenic sulcus posteriorly. Multiple nodules of the left upper lobe and right upper lobe, in the outer 1/3 of the lung. These nodular changes were not present on the x-ray of 07/22/2017. No pleural effusion. Upper Abdomen: No acute finding Musculoskeletal: No acute displaced fracture. Mild degenerative changes of the thoracic spine. IMPRESSION: CT demonstrates evidence multifocal infection as well as pulmonary infarction in the setting of subacute/developing chronic pulmonary embolism: -the nodular and ground-glass changes of the lungs, predominantly right lower lobe, favored to represent multifocal infection given that these changes were not present on the chest x-ray of July 22, 2017. Because rapidly evolving metastatic disease is not excluded, follow-up contrast-enhanced chest CT is recommended once the patient has been treated in 2-3 months. -superimposed pulmonary infarction of the right greater than left lower lobes with associated atelectasis/volume  loss. Mural based pulmonary embolism of the right lower lobe segmental/subsegmental branches with partial recanalization, compatible with subacute to early chronic DVT. Circumferential distal esophageal thickening. This may be seen in the setting of longstanding GERD. Correlation with symptoms as well as potentially referral for upper endoscopy may be useful. Advanced emphysema.  Emphysema (ICD10-J43.9). Aortic Atherosclerosis (ICD10-I70.0). These results will be called to the ordering clinician or representative by the Radiologist Assistant, and communication documented in the PACS or zVision Dashboard. Electronically Signed   By: Corrie Mckusick D.O.   On: 03/19/2018 08:13   Vas [place] Lower Extremity Venous (dvt)  Result Date: 03/20/2018  Lower Venous Study Risk Factors: Confirmed PE Probable New lung cancer. Comparison Study: No prior study on file for comparison Performing Technologist: Sharion Dove RVS  Examination Guidelines: A complete evaluation includes B-mode imaging, spectral Doppler, color Doppler, and power Doppler as needed of all accessible portions of each vessel. Bilateral testing is considered an integral part of a complete examination. Limited examinations for reoccurring indications may be performed as noted.  Right Venous Findings: +---------+---------------+---------+-----------+----------+-------+          CompressibilityPhasicitySpontaneityPropertiesSummary +---------+---------------+---------+-----------+----------+-------+ CFV      Full  Yes                                   +---------+---------------+---------+-----------+----------+-------+ SFJ      Full                                                 +---------+---------------+---------+-----------+----------+-------+ FV Prox  Full                                                 +---------+---------------+---------+-----------+----------+-------+ FV Mid   Full                                                  +---------+---------------+---------+-----------+----------+-------+ FV DistalFull                                                 +---------+---------------+---------+-----------+----------+-------+ PFV      Full                                                 +---------+---------------+---------+-----------+----------+-------+ POP      Full           Yes      Yes                          +---------+---------------+---------+-----------+----------+-------+ PTV      Full                                                 +---------+---------------+---------+-----------+----------+-------+ PERO     Full                                                 +---------+---------------+---------+-----------+----------+-------+  Left Venous Findings: +---------+---------------+---------+-----------+----------+-------+          CompressibilityPhasicitySpontaneityPropertiesSummary +---------+---------------+---------+-----------+----------+-------+ CFV      Full           Yes      Yes                          +---------+---------------+---------+-----------+----------+-------+ SFJ      Full                                                 +---------+---------------+---------+-----------+----------+-------+ FV Prox  Full                                                 +---------+---------------+---------+-----------+----------+-------+  FV Mid   Full                                                 +---------+---------------+---------+-----------+----------+-------+ FV DistalFull                                                 +---------+---------------+---------+-----------+----------+-------+ PFV      Full                                                 +---------+---------------+---------+-----------+----------+-------+ POP      Full           Yes      Yes                           +---------+---------------+---------+-----------+----------+-------+ PTV      Full                                                 +---------+---------------+---------+-----------+----------+-------+ PERO     Full                                                 +---------+---------------+---------+-----------+----------+-------+    Summary: Right: There is no evidence of deep vein thrombosis in the lower extremity. Left: There is no evidence of deep vein thrombosis in the lower extremity.  *See table(s) above for measurements and observations. Electronically signed by Servando Snare MD on 03/20/2018 at 6:00:32 PM.    Final       TODAY-DAY OF DISCHARGE:  Subjective:   Caleb Harper today has no headache,no chest abdominal pain,no new weakness tingling or numbness, feels much better wants to go home today.   Objective:   Blood pressure 102/62, pulse (!) 104, temperature 97.7 F (36.5 C), resp. rate 18, height 5\' 9"  (1.753 m), weight 56.7 kg, SpO2 95 %.  Intake/Output Summary (Last 24 hours) at 03/22/2018 1006 Last data filed at 03/22/2018 0917 Gross per 24 hour  Intake 720 ml  Output -  Net 720 ml   Filed Weights   03/19/18 0939  Weight: 56.7 kg    Exam: Awake Alert, Oriented *3, No new F.N deficits, Normal affect [place].AT,PERRAL Supple Neck,No JVD, No cervical lymphadenopathy appriciated.  Symmetrical Chest wall movement, Good air movement bilaterally, CTAB RRR,No Gallops,Rubs or new Murmurs, No Parasternal Heave +ve B.Sounds, Abd Soft, Non tender, No organomegaly appriciated, No rebound -guarding or rigidity. No Cyanosis, Clubbing or edema, No new Rash or bruise   PERTINENT RADIOLOGIC STUDIES: Dg Chest 2 View  Result Date: 02/25/2018 CLINICAL DATA:  Follow-up examination for pneumonia. Shortness of breath with productive cough. EXAM: CHEST - 2 VIEW COMPARISON:  Prior radiograph from 07/22/2017. FINDINGS: Cardiac and mediastinal silhouettes are stable in size and  contour, and remain within normal  limits. Lungs normally inflated. Patchy multifocal infiltrates involving the left upper and lower lobes, concerning for multifocal infiltrates. Right basilar opacity that partially silhouettes the right heart border consistent with right middle lobe atelectasis, although superimposed infection would be difficult to exclude. Right minor fissure displaced inferiorly. No pulmonary edema or pleural effusion. No pneumothorax. No acute osseous abnormality. IMPRESSION: 1. Patchy multifocal opacities involving the left upper and lower lobes, concerning for persistent multifocal infection/pneumonia. 2. Additional patchy right basilar opacity, favored to in large part reflect right middle lobe atelectasis, although superimposed infiltrate within this region would be difficult to exclude. Electronically Signed   By: Jeannine Boga M.D.   On: 02/25/2018 15:57   Ct Chest W Contrast  Result Date: 03/19/2018 CLINICAL DATA:  57 year old male with a history of persisting pneumonia for 2 months EXAM: CT CHEST WITH CONTRAST TECHNIQUE: Multidetector CT imaging of the chest was performed during intravenous contrast administration. CONTRAST:  42mL OMNIPAQUE IOHEXOL 300 MG/ML  SOLN COMPARISON:  Chest x-ray 02/25/2018 07/22/2017 FINDINGS: Cardiovascular: Heart size within normal limits. The right ventricle/left ventricle ratio estimated less than 1.0. No pericardial fluid/thickening. No significant coronary calcifications. No aortic valve calcifications. Normal course caliber and contour of the thoracic aorta. Mild atherosclerotic changes. Branch vessels are patent. No periaortic fluid. Main pulmonary artery measures 2.9 cm. Filling defects within the segmental branches and subsegmental branches of the right lower lobe. Filling defects are mural and not central, with evidence of partial recanalization. There is associated hypoenhancing lung parenchyma at the right lung base. On the left no  filling defects identified, however, there are regions of hypo attenuating/enhancing parenchyma at the left lung base. Mediastinum/Nodes: Borderline enlarged lymph nodes of the mediastinum, predominantly of the lower paratracheal nodes and hilar nodes as well as subcarinal nodes. Index node on the right measures 13 mm at the hilum. AP window node measures 9 mm. Circumferential wall thickening of the distal esophagus. No endotracheal or central endobronchial debris. Unremarkable appearance of the thoracic inlet. Lungs/Pleura: Advanced centrilobular and paraseptal emphysema. Mixed consolidation and ground-glass opacity of the bilateral dependent lower lobes, more pronounced on the right complete collapse of the medial segments/posterior segment. Regions of hypoenhancement compatible with infarction. On the left there are regions of hypoenhancement in the costophrenic sulcus posteriorly. Multiple nodules of the left upper lobe and right upper lobe, in the outer 1/3 of the lung. These nodular changes were not present on the x-ray of 07/22/2017. No pleural effusion. Upper Abdomen: No acute finding Musculoskeletal: No acute displaced fracture. Mild degenerative changes of the thoracic spine. IMPRESSION: CT demonstrates evidence multifocal infection as well as pulmonary infarction in the setting of subacute/developing chronic pulmonary embolism: -the nodular and ground-glass changes of the lungs, predominantly right lower lobe, favored to represent multifocal infection given that these changes were not present on the chest x-ray of July 22, 2017. Because rapidly evolving metastatic disease is not excluded, follow-up contrast-enhanced chest CT is recommended once the patient has been treated in 2-3 months. -superimposed pulmonary infarction of the right greater than left lower lobes with associated atelectasis/volume loss. Mural based pulmonary embolism of the right lower lobe segmental/subsegmental branches with partial  recanalization, compatible with subacute to early chronic DVT. Circumferential distal esophageal thickening. This may be seen in the setting of longstanding GERD. Correlation with symptoms as well as potentially referral for upper endoscopy may be useful. Advanced emphysema.  Emphysema (ICD10-J43.9). Aortic Atherosclerosis (ICD10-I70.0). These results will be called to the ordering clinician or representative by the Radiologist  Assistant, and communication documented in the PACS or zVision Dashboard. Electronically Signed   By: Corrie Mckusick D.O.   On: 03/19/2018 08:13   Vas [place] Lower Extremity Venous (dvt)  Result Date: 03/20/2018  Lower Venous Study Risk Factors: Confirmed PE Probable New lung cancer. Comparison Study: No prior study on file for comparison Performing Technologist: Sharion Dove RVS  Examination Guidelines: A complete evaluation includes B-mode imaging, spectral Doppler, color Doppler, and power Doppler as needed of all accessible portions of each vessel. Bilateral testing is considered an integral part of a complete examination. Limited examinations for reoccurring indications may be performed as noted.  Right Venous Findings: +---------+---------------+---------+-----------+----------+-------+          CompressibilityPhasicitySpontaneityPropertiesSummary +---------+---------------+---------+-----------+----------+-------+ CFV      Full           Yes                                   +---------+---------------+---------+-----------+----------+-------+ SFJ      Full                                                 +---------+---------------+---------+-----------+----------+-------+ FV Prox  Full                                                 +---------+---------------+---------+-----------+----------+-------+ FV Mid   Full                                                 +---------+---------------+---------+-----------+----------+-------+ FV DistalFull                                                  +---------+---------------+---------+-----------+----------+-------+ PFV      Full                                                 +---------+---------------+---------+-----------+----------+-------+ POP      Full           Yes      Yes                          +---------+---------------+---------+-----------+----------+-------+ PTV      Full                                                 +---------+---------------+---------+-----------+----------+-------+ PERO     Full                                                 +---------+---------------+---------+-----------+----------+-------+  Left Venous Findings: +---------+---------------+---------+-----------+----------+-------+          CompressibilityPhasicitySpontaneityPropertiesSummary +---------+---------------+---------+-----------+----------+-------+ CFV      Full           Yes      Yes                          +---------+---------------+---------+-----------+----------+-------+ SFJ      Full                                                 +---------+---------------+---------+-----------+----------+-------+ FV Prox  Full                                                 +---------+---------------+---------+-----------+----------+-------+ FV Mid   Full                                                 +---------+---------------+---------+-----------+----------+-------+ FV DistalFull                                                 +---------+---------------+---------+-----------+----------+-------+ PFV      Full                                                 +---------+---------------+---------+-----------+----------+-------+ POP      Full           Yes      Yes                          +---------+---------------+---------+-----------+----------+-------+ PTV      Full                                                  +---------+---------------+---------+-----------+----------+-------+ PERO     Full                                                 +---------+---------------+---------+-----------+----------+-------+    Summary: Right: There is no evidence of deep vein thrombosis in the lower extremity. Left: There is no evidence of deep vein thrombosis in the lower extremity.  *See table(s) above for measurements and observations. Electronically signed by Servando Snare MD on 03/20/2018 at 6:00:32 PM.    Final      PERTINENT LAB RESULTS: CBC: Recent Labs    03/20/18 0549 03/21/18 0437  WBC 13.3* 11.8*  HGB 13.6 14.1  HCT 43.4 44.7  PLT 298 323   CMET CMP     Component Value Date/Time   NA 138 03/21/2018  0437   K 4.1 03/21/2018 0437   CL 104 03/21/2018 0437   CO2 26 03/21/2018 0437   GLUCOSE 95 03/21/2018 0437   BUN 12 03/21/2018 0437   CREATININE 1.04 03/21/2018 0437   CALCIUM 9.2 03/21/2018 0437   PROT 7.6 03/19/2018 0946   ALBUMIN 3.5 03/19/2018 0946   AST 19 03/19/2018 0946   ALT 13 03/19/2018 0946   ALKPHOS 130 (H) 03/19/2018 0946   BILITOT 0.6 03/19/2018 0946   GFRNONAA >60 03/21/2018 0437   GFRAA >60 03/21/2018 0437    GFR Estimated Creatinine Clearance: 63.6 mL/min (by C-G formula based on SCr of 1.04 mg/dL). No results for input(s): LIPASE, AMYLASE in the last 72 hours. No results for input(s): CKTOTAL, CKMB, CKMBINDEX, TROPONINI in the last 72 hours. Invalid input(s): POCBNP No results for input(s): DDIMER in the last 72 hours. No results for input(s): HGBA1C in the last 72 hours. No results for input(s): CHOL, HDL, LDLCALC, TRIG, CHOLHDL, LDLDIRECT in the last 72 hours. No results for input(s): TSH, T4TOTAL, T3FREE, THYROIDAB in the last 72 hours.  Invalid input(s): FREET3 No results for input(s): VITAMINB12, FOLATE, FERRITIN, TIBC, IRON, RETICCTPCT in the last 72 hours. Coags: No results for input(s): INR in the last 72 hours.  Invalid input(s):  PT Microbiology: No results found for this or any previous visit (from the past 240 hour(s)).  FURTHER DISCHARGE INSTRUCTIONS:  Get Medicines reviewed and adjusted: Please take all your medications with you for your next visit with your Primary MD  Laboratory/radiological data: Please request your Primary MD to go over all hospital tests and procedure/radiological results at the follow up, please ask your Primary MD to get all Hospital records sent to his/her office.  In some cases, they will be blood work, cultures and biopsy results pending at the time of your discharge. Please request that your primary care M.D. goes through all the records of your hospital data and follows up on these results.  Also Note the following: If you experience worsening of your admission symptoms, develop shortness of breath, life threatening emergency, suicidal or homicidal thoughts you must seek medical attention immediately by calling 911 or calling your MD immediately  if symptoms less severe.  You must read complete instructions/literature along with all the possible adverse reactions/side effects for all the Medicines you take and that have been prescribed to you. Take any new Medicines after you have completely understood and accpet all the possible adverse reactions/side effects.   Do not drive when taking Pain medications or sleeping medications (Benzodaizepines)  Do not take more than prescribed Pain, Sleep and Anxiety Medications. It is not advisable to combine anxiety,sleep and pain medications without talking with your primary care practitioner  Special Instructions: If you have smoked or chewed Tobacco  in the last 2 yrs please stop smoking, stop any regular Alcohol  and or any Recreational drug use.  Wear Seat belts while driving.  Please note: You were cared for by a hospitalist during your hospital stay. Once you are discharged, your primary care physician will handle any further medical  issues. Please note that NO REFILLS for any discharge medications will be authorized once you are discharged, as it is imperative that you return to your primary care physician (or establish a relationship with a primary care physician if you do not have one) for your post hospital discharge needs so that they can reassess your need for medications and monitor your lab values.  Total Time spent coordinating discharge  including counseling, education and face to face time equals  45 minutes.  Signed: Vanderbilt Ranieri 03/22/2018 10:06 AM

## 2018-03-22 NOTE — Care Management Note (Signed)
Case Management Note  Patient Details  Name: Caleb Harper MRN: [id number] Date of Birth: 25-Apr-1960  Subjective/Objective:                    Action/Plan:  Spoke to patient at bedside to discuss DME needs and DC. Patient would like to DC to home. Patient will have oxygen for home use delivered to room prior to DC. Patient provided with Delene Loll and Jennye Moccasin cards to assist with copays.   No further CM needs identified.   Expected Discharge Date:                  Expected Discharge Plan:  Home/Self Care  In-House Referral:     Discharge planning Services  CM Consult, Medication Assistance  Post Acute Care Choice:  Durable Medical Equipment Choice offered to:     DME Arranged:  Oxygen DME Agency:  [place]:    [place] Agency:     Status of Service:  Completed, signed off  If discussed at [place] of Stay Meetings, dates discussed:    Additional Comments:  Carles Collet, RN 03/22/2018, 10:03 AM

## 2018-03-27 ENCOUNTER — Ambulatory Visit (INDEPENDENT_AMBULATORY_CARE_PROVIDER_SITE_OTHER): Payer: BLUE CROSS/BLUE SHIELD | Admitting: Pulmonary Disease

## 2018-03-27 ENCOUNTER — Encounter: Payer: Self-pay | Admitting: Pulmonary Disease

## 2018-03-27 VITALS — BP 122/64 | HR 60 | Ht 68.0 in | Wt 115.4 lb

## 2018-03-27 DIAGNOSIS — R911 Solitary pulmonary nodule: Secondary | ICD-10-CM | POA: Diagnosis not present

## 2018-03-27 DIAGNOSIS — R0602 Shortness of breath: Secondary | ICD-10-CM

## 2018-03-27 DIAGNOSIS — J449 Chronic obstructive pulmonary disease, unspecified: Secondary | ICD-10-CM | POA: Diagnosis not present

## 2018-03-27 NOTE — Progress Notes (Signed)
Caleb Harper    [id number]    1960-07-30  Primary Care Physician:Mitchell, L.Marlou Sa, MD  Referring Physician: [name], [name], [address] [employer] [place], [place] [address]  Chief complaint: Follow-up after recent hospitalization for PE, pneumonia,?  Malignancy  HPI: 57 year old smoker with history of chronic systolic heart failure [EF 35-40%], emphysema.  Admitted in late November for acute respiratory failure in the setting of bilateral pulmonary embolism, lung infiltrates with nodules suspicious for pneumonia.  Also found to have chronic systolic heart failure  Per history he had already completed 3 rounds of antibiotics as an outpatient.  Procalcitonin was not elevated hence he was not given any additional antibiotic therapy He was started on anticoagulation and discharged to be followed up with pulmonary clinic  Currently on Spiriva inhaler.  Reports chronic dyspnea on exertion, cough without mucus production.  Pets: No pets Occupation: Works as an Clinical biochemist Exposures: No known exposures, no known heart, [place] Smoking history: 30-pack-year smoker.  Quit in October 2019 Travel history: No relevant travel history Relevant family history: Father had COPD.  Outpatient Encounter Medications as of 03/27/2018  Medication Sig  . albuterol (PROVENTIL HFA;VENTOLIN HFA) 108 (90 Base) MCG/ACT inhaler Inhale 2 puffs into the lungs every 4 (four) hours as needed for wheezing or shortness of breath.  . metoprolol succinate (TOPROL-XL) 25 MG 24 hr tablet Take 1 tablet (25 mg total) by mouth daily.  Marland Kitchen oxyCODONE-acetaminophen (PERCOCET/ROXICET) 5-325 MG tablet Take 1-2 tablets by mouth every 6 (six) hours as needed for moderate pain.   . pantoprazole (PROTONIX) 40 MG tablet Take 1 tablet (40 mg total) by mouth daily.  . Rivaroxaban 15 & 20 MG TBPK Take as directed on package: Start with one 15mg  tablet by mouth twice a day with food for 39 more doses. On Day 22 (ie  12/19), switch to one 20mg  tablet once a day with food.  . sacubitril-valsartan (ENTRESTO) 24-26 MG Take 1 tablet by mouth 2 (two) times daily.  Marland Kitchen tiotropium (SPIRIVA HANDIHALER) 18 MCG inhalation capsule Place 1 capsule (18 mcg total) into inhaler and inhale daily.   No facility-administered encounter medications on file as of 03/27/2018.     Allergies as of 03/27/2018  . (No Known Allergies)    Past Medical History:  Diagnosis Date  . Chronic back pain   . Emphysema lung ([place])   . Tobacco abuse     Past Surgical History:  Procedure Laterality Date  . BACK SURGERY      Family History  Problem Relation Age of Onset  . Cancer Mother   . COPD Father     Social History   Socioeconomic History  . Marital status: Single    Spouse name: Not on file  . Number of children: Not on file  . Years of education: Not on file  . Highest education level: Not on file  Occupational History  . Not on file  Social Needs  . Financial resource strain: Not on file  . Food insecurity:    Worry: Not on file    Inability: Not on file  . Transportation needs:    Medical: Not on file    Non-medical: Not on file  Tobacco Use  . Smoking status: Former Smoker    Types: Cigarettes  Substance and Sexual Activity  . Alcohol use: Yes  . Drug use: No  . Sexual activity: Not on file  Lifestyle  . Physical activity:  Days per week: Not on file    Minutes per session: Not on file  . Stress: Not on file  Relationships  . Social connections:    Talks on phone: Not on file    Gets together: Not on file    Attends religious service: Not on file    Active member of club or organization: Not on file    Attends meetings of clubs or organizations: Not on file    Relationship status: Not on file  . Intimate partner violence:    Fear of current or ex partner: Not on file    Emotionally abused: Not on file    Physically abused: Not on file    Forced sexual activity: Not on file  Other Topics  Concern  . Not on file  Social History Narrative  . Not on file   Review of systems: Review of Systems  Constitutional: Negative for fever and chills.  HENT: Negative.   Eyes: Negative for blurred vision.  Respiratory: as per HPI  Cardiovascular: Negative for chest pain and palpitations.  Gastrointestinal: Negative for vomiting, diarrhea, blood per rectum. Genitourinary: Negative for dysuria, urgency, frequency and hematuria.  Musculoskeletal: Negative for myalgias, back pain and joint pain.  Skin: Negative for itching and rash.  Neurological: Negative for dizziness, tremors, focal weakness, seizures and loss of consciousness.  Endo/Heme/Allergies: Negative for environmental allergies.  Psychiatric/Behavioral: Negative for depression, suicidal ideas and hallucinations.  All other systems reviewed and are negative.  Physical Exam: Blood pressure 122/64, pulse 60, height 5\' 8"  (1.727 m), weight 115 lb 6.4 oz (52.3 kg), SpO2 90 %. Gen:      No acute distress HEENT:  EOMI, sclera anicteric Neck:     No masses; no thyromegaly Lungs:    Clear to auscultation bilaterally; normal respiratory effort CV:         Regular rate and rhythm; no murmurs Abd:      + bowel sounds; soft, non-tender; no palpable masses, no distension Ext:    No edema; adequate peripheral perfusion Skin:      Warm and dry; no rash Neuro: alert and oriented x 3 Psych: normal mood and affect  Data Reviewed: Imaging: CT chest 03/18/2018- Bilateral nodular, groundglass opacities predominantly in the right lower lobe. Based pulmonary embolism of the right lower lobe with recannulization compatible with subacute to early chronic DVT with associated pulmonary infarction.  Esophageal thickening, aortic atherosclerosis. I reviewed the images personally.  PFTs: Pending  Labs: CBC 03/19/2018-WBC 15, eos 0%  Assessment:  Abnormal CT with bilateral nodular infiltrates He has already being treated for pneumonia.  Need  to evaluate underlying malignancy Scheduled for PET scan and possible lung biopsy.  We would like to wait at least until early January before getting the lung biopsy to allow him to have adequate time on anticoagulation  Pulmonary embolism Continue anticoagulation  Severe emphysema Currently on Spiriva Schedule pulmonary function test for evaluation of COPD  Plan/Recommendations: - Schedule PET scan for early January. - PFTs - Continue anticoagulation  Follow up in clinic after scan for evaluation and plan for next steps.  Marshell Garfinkel MD [facility] 03/27/2018, 2:40 PM  CC: [name], L.Marlou Sa, MD

## 2018-03-27 NOTE — Patient Instructions (Signed)
We will schedule you for PET scan in early January We will also get pulmonary function test and follow-up in clinic after scan for review and plan for next steps

## 2018-03-31 DIAGNOSIS — I2699 Other pulmonary embolism without acute cor pulmonale: Secondary | ICD-10-CM | POA: Diagnosis not present

## 2018-03-31 DIAGNOSIS — R911 Solitary pulmonary nodule: Secondary | ICD-10-CM | POA: Diagnosis not present

## 2018-03-31 DIAGNOSIS — J449 Chronic obstructive pulmonary disease, unspecified: Secondary | ICD-10-CM | POA: Diagnosis not present

## 2018-04-02 ENCOUNTER — Ambulatory Visit (INDEPENDENT_AMBULATORY_CARE_PROVIDER_SITE_OTHER): Payer: BLUE CROSS/BLUE SHIELD | Admitting: Pulmonary Disease

## 2018-04-02 DIAGNOSIS — J449 Chronic obstructive pulmonary disease, unspecified: Secondary | ICD-10-CM | POA: Diagnosis not present

## 2018-04-02 LAB — PULMONARY FUNCTION TEST
DL/VA % pred: 54 %
DL/VA: 2.44 ml/min/mmHg/L
DLCO unc % pred: 34 %
DLCO unc: 10.13 ml/min/mmHg
FEF 25-75 Post: 1.23 L/sec
FEF 25-75 Pre: 1.2 L/sec
FEF2575-%CHANGE-POST: 2 %
FEF2575-%PRED-PRE: 39 %
FEF2575-%Pred-Post: 41 %
FEV1-%Change-Post: 4 %
FEV1-%PRED-PRE: 57 %
FEV1-%Pred-Post: 59 %
FEV1-Post: 2.08 L
FEV1-Pre: 2 L
FEV1FVC-%Change-Post: 4 %
FEV1FVC-%PRED-PRE: 80 %
FEV6-%Change-Post: -1 %
FEV6-%Pred-Post: 73 %
FEV6-%Pred-Pre: 74 %
FEV6-Post: 3.19 L
FEV6-Pre: 3.24 L
FEV6FVC-%Change-Post: -1 %
FEV6FVC-%Pred-Post: 103 %
FEV6FVC-%Pred-Pre: 104 %
FVC-%Change-Post: 0 %
FVC-%Pred-Post: 70 %
FVC-%Pred-Pre: 71 %
FVC-Post: 3.23 L
FVC-Pre: 3.24 L
Post FEV1/FVC ratio: 64 %
Post FEV6/FVC ratio: 99 %
Pre FEV1/FVC ratio: 62 %
Pre FEV6/FVC Ratio: 100 %

## 2018-04-02 NOTE — Progress Notes (Signed)
PFT done today. 

## 2018-04-08 ENCOUNTER — Ambulatory Visit (HOSPITAL_COMMUNITY)
Admission: RE | Admit: 2018-04-08 | Discharge: 2018-04-08 | Disposition: A | Discharge status: Home / Self Care | Payer: BLUE CROSS/BLUE SHIELD | Source: Ambulatory Visit | Attending: Pulmonary Disease | Admitting: Pulmonary Disease

## 2018-04-08 DIAGNOSIS — R911 Solitary pulmonary nodule: Secondary | ICD-10-CM | POA: Insufficient documentation

## 2018-04-08 DIAGNOSIS — R918 Other nonspecific abnormal finding of lung field: Secondary | ICD-10-CM | POA: Diagnosis not present

## 2018-04-08 LAB — GLUCOSE, CAPILLARY: Glucose-Capillary: 84 mg/dL (ref 70–99)

## 2018-04-08 MED ORDER — FLUDEOXYGLUCOSE F - 18 (FDG) INJECTION
5.9300 | Freq: Once | INTRAVENOUS | Status: AC | PRN
  Administered 2018-04-08: 5.93 via INTRAVENOUS

## 2018-04-09 ENCOUNTER — Telehealth: Payer: Self-pay | Admitting: Pulmonary Disease

## 2018-04-09 NOTE — Telephone Encounter (Signed)
Spoke with Caleb Harper, he has an appt with [facility] on Friday 04/11/2018 at 9:30. Caleb Harper has many questions about the PE they found on the CT. He wants to know what the follow up is for this because he is still in pain and is very confused about what is going on. He would like to know as soon as possible. Dr. Vaughan Browner please advise.

## 2018-04-09 NOTE — Telephone Encounter (Signed)
PET scan was done sooner than intended due to scheduling error  I reviewed the PET scan with patient. Findings are not very suggestive of malignancy but show uptake consistent with inflammation and infection. Clinically he is doing ok. I have recommended a follow up CT in 1-2 months to ensure improvement  He is requesting a sooner follow up to get checked out. Please make appointment with NP at next available.

## 2018-04-09 NOTE — Telephone Encounter (Addendum)
Spoke with pt, he is requesting results from his PET can he had done yesterday. I advised him that I would route this message to Dr. Vaughan Browner to review. Please advise.

## 2018-04-10 DIAGNOSIS — R030 Elevated blood-pressure reading, without diagnosis of hypertension: Secondary | ICD-10-CM | POA: Diagnosis not present

## 2018-04-10 DIAGNOSIS — M48062 Spinal stenosis, lumbar region with neurogenic claudication: Secondary | ICD-10-CM | POA: Diagnosis not present

## 2018-04-10 DIAGNOSIS — F1120 Opioid dependence, uncomplicated: Secondary | ICD-10-CM | POA: Diagnosis not present

## 2018-04-10 DIAGNOSIS — M5416 Radiculopathy, lumbar region: Secondary | ICD-10-CM | POA: Diagnosis not present

## 2018-04-10 NOTE — Telephone Encounter (Signed)
Noted by triage.  

## 2018-04-10 NOTE — Telephone Encounter (Signed)
It was difficult to make him understand over phone. Caleb Harper can discuss with him tomorrow at clinic visit.

## 2018-04-11 ENCOUNTER — Ambulatory Visit (INDEPENDENT_AMBULATORY_CARE_PROVIDER_SITE_OTHER): Payer: BLUE CROSS/BLUE SHIELD | Admitting: Nurse Practitioner

## 2018-04-11 ENCOUNTER — Encounter: Payer: Self-pay | Admitting: Nurse Practitioner

## 2018-04-11 VITALS — BP 102/60 | HR 55 | Temp 97.9°F | Ht 68.0 in | Wt 115.2 lb

## 2018-04-11 DIAGNOSIS — R911 Solitary pulmonary nodule: Secondary | ICD-10-CM | POA: Diagnosis not present

## 2018-04-11 DIAGNOSIS — I2699 Other pulmonary embolism without acute cor pulmonale: Secondary | ICD-10-CM

## 2018-04-11 NOTE — Progress Notes (Signed)
@Patient  ID: Caleb Harper, male    DOB: 1960/09/22, 57 y.o.   MRN: [id number]  Chief Complaint  Patient presents with  . Results    Discuss PET results    Referring provider: [name], L.Marlou Sa, MD  HPI 57 year old male smoker with history of CHF and emphysema followed by Dr. Vaughan Browner.  Tests/recent events:  November 2019 -admitted to the hospital for acute respiratory failure in the setting of bilateral pulmonary embolism, lung infiltrates with nodules suspicious for pneumonia.  Also found to have chronic systolic heart failure.  He has completed 3 rounds of antibiotics.  PET 04/08/18 - Extensive hypermetabolic bilateral lower lobe airspace disease, most likely infectious or inflammatory etiology. Multifocal and nodular areas of airspace disease in both upper lobes are also hypermetabolic, and favor inflammatory or infectious process, with malignancy considered less likely. Consider bronchoscopy versus short-term follow-up by CT in 1-2 months. Mild hypermetabolic bilateral hilar lymphadenopathy, which may be reactive in etiology. No evidence of extra-thoracic disease.  CT chest 04/08/18 - CT demonstrates evidence multifocal infection as well as pulmonary infarction in the setting of subacute/developing chronic pulmonary embolism.  OV 04/11/18 - Follow up Patient presents for follow-up visit today.  Here to discuss results of his recent PET scan.  He states that he has been doing well since his last visit with Dr. Vaughan Browner.  He is still weak however.  And off his strength from recent hospital admission.  He is still short of breath at times.  He does have O2 was prescribed at hospital follow-up that he can use at home with exertion.  He is not wearing oxygen in office today.  His sats are 90% on room air.  His vital signs are stable.  The recent fever, chest pain, or edema.  He is afebrile today.    No Known Allergies   There is no immunization history on file for this patient.  Past  Medical History:  Diagnosis Date  . Chronic back pain   . Emphysema lung ([place])   . Tobacco abuse     Tobacco History: Social History   Tobacco Use  Smoking Status Former Smoker  . Types: Cigarettes  Smokeless Tobacco Never Used   Counseling given: Yes   Outpatient Encounter Medications as of 04/11/2018  Medication Sig  . albuterol (PROVENTIL HFA;VENTOLIN HFA) 108 (90 Base) MCG/ACT inhaler Inhale 2 puffs into the lungs every 4 (four) hours as needed for wheezing or shortness of breath.  . metoprolol succinate (TOPROL-XL) 25 MG 24 hr tablet Take 1 tablet (25 mg total) by mouth daily.  Marland Kitchen oxyCODONE-acetaminophen (PERCOCET/ROXICET) 5-325 MG tablet Take 1-2 tablets by mouth every 6 (six) hours as needed for moderate pain.   . pantoprazole (PROTONIX) 40 MG tablet Take 1 tablet (40 mg total) by mouth daily.  . Rivaroxaban 15 & 20 MG TBPK Take as directed on package: Start with one 15mg  tablet by mouth twice a day with food for 39 more doses. On Day 22 (ie 12/19), switch to one 20mg  tablet once a day with food.  . sacubitril-valsartan (ENTRESTO) 24-26 MG Take 1 tablet by mouth 2 (two) times daily.  Marland Kitchen tiotropium (SPIRIVA HANDIHALER) 18 MCG inhalation capsule Place 1 capsule (18 mcg total) into inhaler and inhale daily.   No facility-administered encounter medications on file as of 04/11/2018.      Review of Systems  Review of Systems  Constitutional: Negative.  Negative for chills and fever.  HENT: Negative.   Respiratory: Positive for shortness of  breath. Negative for cough and wheezing.   Cardiovascular: Negative.  Negative for chest pain, palpitations and leg swelling.  Gastrointestinal: Negative.   Allergic/Immunologic: Negative.   Neurological: Negative.   Psychiatric/Behavioral: Negative.        Physical Exam  BP 102/60 (BP Location: Right Arm, Patient Position: Sitting, Cuff Size: Normal)   Pulse (!) 55   Temp 97.9 F (36.6 C)   Ht 5\' 8"  (1.727 m)   Wt 115 lb 3.2 oz  (52.3 kg)   SpO2 90%   BMI 17.52 kg/m   Wt Readings from Last 5 Encounters:  04/11/18 115 lb 3.2 oz (52.3 kg)  03/27/18 115 lb 6.4 oz (52.3 kg)  03/19/18 125 lb (56.7 kg)  11/13/13 134 lb (60.8 kg)     Physical Exam Vitals signs and nursing note reviewed.  Constitutional:      General: He is not in acute distress.    Appearance: He is well-developed.  Cardiovascular:     Rate and Rhythm: Normal rate and regular rhythm.  Pulmonary:     Effort: Respiratory distress present.     Breath sounds: Normal breath sounds. No wheezing or rhonchi.  Musculoskeletal:        General: No swelling.  Skin:    General: Skin is warm and dry.  Neurological:     Mental Status: He is alert and oriented to person, place, and time.       Imaging: Ct Chest W Contrast  Result Date: 03/19/2018 CLINICAL DATA:  57 year old male with a history of persisting pneumonia for 2 months EXAM: CT CHEST WITH CONTRAST TECHNIQUE: Multidetector CT imaging of the chest was performed during intravenous contrast administration. CONTRAST:  41mL OMNIPAQUE IOHEXOL 300 MG/ML  SOLN COMPARISON:  Chest x-ray 02/25/2018 07/22/2017 FINDINGS: Cardiovascular: Heart size within normal limits. The right ventricle/left ventricle ratio estimated less than 1.0. No pericardial fluid/thickening. No significant coronary calcifications. No aortic valve calcifications. Normal course caliber and contour of the thoracic aorta. Mild atherosclerotic changes. Branch vessels are patent. No periaortic fluid. Main pulmonary artery measures 2.9 cm. Filling defects within the segmental branches and subsegmental branches of the right lower lobe. Filling defects are mural and not central, with evidence of partial recanalization. There is associated hypoenhancing lung parenchyma at the right lung base. On the left no filling defects identified, however, there are regions of hypo attenuating/enhancing parenchyma at the left lung base. Mediastinum/Nodes:  Borderline enlarged lymph nodes of the mediastinum, predominantly of the lower paratracheal nodes and hilar nodes as well as subcarinal nodes. Index node on the right measures 13 mm at the hilum. AP window node measures 9 mm. Circumferential wall thickening of the distal esophagus. No endotracheal or central endobronchial debris. Unremarkable appearance of the thoracic inlet. Lungs/Pleura: Advanced centrilobular and paraseptal emphysema. Mixed consolidation and ground-glass opacity of the bilateral dependent lower lobes, more pronounced on the right complete collapse of the medial segments/posterior segment. Regions of hypoenhancement compatible with infarction. On the left there are regions of hypoenhancement in the costophrenic sulcus posteriorly. Multiple nodules of the left upper lobe and right upper lobe, in the outer 1/3 of the lung. These nodular changes were not present on the x-ray of 07/22/2017. No pleural effusion. Upper Abdomen: No acute finding Musculoskeletal: No acute displaced fracture. Mild degenerative changes of the thoracic spine. IMPRESSION: CT demonstrates evidence multifocal infection as well as pulmonary infarction in the setting of subacute/developing chronic pulmonary embolism: -the nodular and ground-glass changes of the lungs, predominantly right lower lobe, favored  to represent multifocal infection given that these changes were not present on the chest x-ray of July 22, 2017. Because rapidly evolving metastatic disease is not excluded, follow-up contrast-enhanced chest CT is recommended once the patient has been treated in 2-3 months. -superimposed pulmonary infarction of the right greater than left lower lobes with associated atelectasis/volume loss. Mural based pulmonary embolism of the right lower lobe segmental/subsegmental branches with partial recanalization, compatible with subacute to early chronic DVT. Circumferential distal esophageal thickening. This may be seen in the setting  of longstanding GERD. Correlation with symptoms as well as potentially referral for upper endoscopy may be useful. Advanced emphysema.  Emphysema (ICD10-J43.9). Aortic Atherosclerosis (ICD10-I70.0). These results will be called to the ordering clinician or representative by the Radiologist Assistant, and communication documented in the PACS or zVision Dashboard. Electronically Signed   By: Corrie Mckusick D.O.   On: 03/19/2018 08:13   Nm Pet Image Initial (pi) Skull Base To Thigh  Result Date: 04/08/2018 CLINICAL DATA:  Initial treatment strategy for pulmonary nodules. Recent pulmonary embolism and infarction. EXAM: NUCLEAR MEDICINE PET SKULL BASE TO THIGH TECHNIQUE: 5.9 mCi F-18 FDG was injected intravenously. Full-ring PET imaging was performed from the skull base to thigh after the radiotracer. CT data was obtained and used for attenuation correction and anatomic localization. Fasting blood glucose: 84 mg/dl COMPARISON:  Chest CT on 03/18/2018 FINDINGS: (Background mediastinal blood pool activity: SUV max = 1.8) NECK:  No hypermetabolic lymph nodes or masses. Incidental CT findings:  None. CHEST: Mild hypermetabolic activity is seen corresponding to mild bilateral hilar lymphadenopathy seen by CT. SUV max measures 2.8 in both hilar regions. No hypermetabolic mediastinal or axillary lymphadenopathy identified. Extensive bilateral lower lobe airspace disease shows hypermetabolic activity. In addition, there are multifocal patchy and nodular areas of airspace opacity in both upper lobes which show hypermetabolic activity. This constellation of findings is highly suspicious for infectious or inflammatory process, with malignancy considered less likely. No evidence of pleural effusion. Incidental CT findings:  Mild-to-moderate centrilobular emphysema. ABDOMEN/PELVIS: No abnormal hypermetabolic activity within the liver, pancreas, adrenal glands, or spleen. No hypermetabolic lymph nodes in the abdomen or pelvis.  Incidental CT findings:  None. SKELETON: No focal hypermetabolic bone lesions to suggest skeletal metastasis. Incidental CT findings:  None. IMPRESSION: Extensive hypermetabolic bilateral lower lobe airspace disease, most likely infectious or inflammatory etiology. Multifocal and nodular areas of airspace disease in both upper lobes are also hypermetabolic, and favor inflammatory or infectious process, with malignancy considered less likely. Consider bronchoscopy versus short-term follow-up by CT in 1-2 months. Mild hypermetabolic bilateral hilar lymphadenopathy, which may be reactive in etiology. No evidence of extra-thoracic disease. Electronically Signed   By: Earle Gell M.D.   On: 04/08/2018 17:20   Vas [place] Lower Extremity Venous (dvt)  Result Date: 03/20/2018  Lower Venous Study Risk Factors: Confirmed PE Probable New lung cancer. Comparison Study: No prior study on file for comparison Performing Technologist: Sharion Dove RVS  Examination Guidelines: A complete evaluation includes B-mode imaging, spectral Doppler, color Doppler, and power Doppler as needed of all accessible portions of each vessel. Bilateral testing is considered an integral part of a complete examination. Limited examinations for reoccurring indications may be performed as noted.  Right Venous Findings: +---------+---------------+---------+-----------+----------+-------+          CompressibilityPhasicitySpontaneityPropertiesSummary +---------+---------------+---------+-----------+----------+-------+ CFV      Full           Yes                                   +---------+---------------+---------+-----------+----------+-------+  SFJ      Full                                                 +---------+---------------+---------+-----------+----------+-------+ FV Prox  Full                                                 +---------+---------------+---------+-----------+----------+-------+ FV Mid   Full                                                  +---------+---------------+---------+-----------+----------+-------+ FV DistalFull                                                 +---------+---------------+---------+-----------+----------+-------+ PFV      Full                                                 +---------+---------------+---------+-----------+----------+-------+ POP      Full           Yes      Yes                          +---------+---------------+---------+-----------+----------+-------+ PTV      Full                                                 +---------+---------------+---------+-----------+----------+-------+ PERO     Full                                                 +---------+---------------+---------+-----------+----------+-------+  Left Venous Findings: +---------+---------------+---------+-----------+----------+-------+          CompressibilityPhasicitySpontaneityPropertiesSummary +---------+---------------+---------+-----------+----------+-------+ CFV      Full           Yes      Yes                          +---------+---------------+---------+-----------+----------+-------+ SFJ      Full                                                 +---------+---------------+---------+-----------+----------+-------+ FV Prox  Full                                                 +---------+---------------+---------+-----------+----------+-------+  FV Mid   Full                                                 +---------+---------------+---------+-----------+----------+-------+ FV DistalFull                                                 +---------+---------------+---------+-----------+----------+-------+ PFV      Full                                                 +---------+---------------+---------+-----------+----------+-------+ POP      Full           Yes      Yes                           +---------+---------------+---------+-----------+----------+-------+ PTV      Full                                                 +---------+---------------+---------+-----------+----------+-------+ PERO     Full                                                 +---------+---------------+---------+-----------+----------+-------+    Summary: Right: There is no evidence of deep vein thrombosis in the lower extremity. Left: There is no evidence of deep vein thrombosis in the lower extremity.  *See table(s) above for measurements and observations. Electronically signed by Servando Snare MD on 03/20/2018 at 6:00:32 PM.    Final      Assessment & Plan:   Acute pulmonary embolism [facility]) We discussed the results of his recent PET scan in office today and answered any of his questions that he had.  He will need a follow-up CT scan in 2 months.  He seems to be stable today.  He was advised that if shortness of breath increases or if he develops a fever to call the office immediately.  Patient Instructions  Slow recovery process - it will take time to recover Will order follow up CT scan in 2 month Continue current medications Continue O2 at home as needed with exertion Please call if symptoms worsen in any way or fever develops Follow up with Dr. Vaughan Browner after CT scan for results       Fenton Foy, NP 04/11/2018

## 2018-04-11 NOTE — Assessment & Plan Note (Signed)
We discussed the results of his recent PET scan in office today and answered any of his questions that he had.  He will need a follow-up CT scan in 2 months.  He seems to be stable today.  He was advised that if shortness of breath increases or if he develops a fever to call the office immediately.  Patient Instructions  Slow recovery process - it will take time to recover Will order follow up CT scan in 2 month Continue current medications Continue O2 at home as needed with exertion Please call if symptoms worsen in any way or fever develops Follow up with Dr. Vaughan Browner after CT scan for results

## 2018-04-11 NOTE — Patient Instructions (Addendum)
Slow recovery process - it will take time to recover Will order follow up CT scan in 2 month Continue current medications Continue O2 at home as needed with exertion Please call if symptoms worsen in any way or fever develops Follow up with Dr. Vaughan Browner after CT scan for results

## 2018-04-14 DIAGNOSIS — I429 Cardiomyopathy, unspecified: Secondary | ICD-10-CM | POA: Diagnosis not present

## 2018-04-21 DIAGNOSIS — I2699 Other pulmonary embolism without acute cor pulmonale: Secondary | ICD-10-CM | POA: Diagnosis not present

## 2018-04-21 DIAGNOSIS — I429 Cardiomyopathy, unspecified: Secondary | ICD-10-CM | POA: Diagnosis not present

## 2018-04-21 DIAGNOSIS — R918 Other nonspecific abnormal finding of lung field: Secondary | ICD-10-CM | POA: Diagnosis not present

## 2018-04-21 DIAGNOSIS — J9601 Acute respiratory failure with hypoxia: Secondary | ICD-10-CM | POA: Diagnosis not present

## 2018-04-29 ENCOUNTER — Encounter: Payer: Self-pay | Admitting: General Surgery

## 2018-04-29 ENCOUNTER — Ambulatory Visit (INDEPENDENT_AMBULATORY_CARE_PROVIDER_SITE_OTHER): Payer: BLUE CROSS/BLUE SHIELD | Admitting: Nurse Practitioner

## 2018-04-29 ENCOUNTER — Encounter: Payer: Self-pay | Admitting: Nurse Practitioner

## 2018-04-29 ENCOUNTER — Other Ambulatory Visit: Payer: Self-pay | Admitting: General Surgery

## 2018-04-29 VITALS — BP 116/68 | HR 69 | Ht 68.0 in | Wt 115.6 lb

## 2018-04-29 DIAGNOSIS — R918 Other nonspecific abnormal finding of lung field: Secondary | ICD-10-CM | POA: Diagnosis not present

## 2018-04-29 DIAGNOSIS — I2699 Other pulmonary embolism without acute cor pulmonale: Secondary | ICD-10-CM | POA: Diagnosis not present

## 2018-04-29 MED ORDER — DOXYCYCLINE HYCLATE 100 MG PO TABS: 100.0000 mg | ORAL_TABLET | Freq: Two times a day (BID) | ORAL | 0 refills | Status: DC

## 2018-04-29 MED ORDER — PREDNISONE 10 MG PO TABS: ORAL_TABLET | ORAL | 0 refills | Status: DC

## 2018-04-29 NOTE — Patient Instructions (Addendum)
Will order doxycycline Will order prednisone taper Continue Spiriva respimat May take mucinex May take delsym Continue O2 as needed to keep sats above 90% Will reschedule follow up appointment with Dr. Vaughan Browner for a sooner appointment

## 2018-04-29 NOTE — Progress Notes (Signed)
@Patient  ID: Caleb Harper, male    DOB: 10/25/1960, 58 y.o.   MRN: [id number]  Chief Complaint  Patient presents with  . Shortness of Breath    with cough. Has not improved since last vist.    Referring provider: [name], L.Marlou Sa, MD  HPI 58 year old male former smoker with history of CHF and emphysema followed by Dr. Vaughan Browner.  Tests/recent events:  November 2019 -admitted to the hospital for acute respiratory failure in the setting of bilateral pulmonary embolism, lung infiltrates with nodules suspicious for pneumonia.  Also found to have chronic systolic heart failure.  He has completed 3 rounds of antibiotics.  PET 04/08/18 - Extensive hypermetabolic bilateral lower lobe airspace disease, most likely infectious or inflammatory etiology. Multifocal and nodular areas of airspace disease in both upper lobes are also hypermetabolic, and favor inflammatory or infectious process, with malignancy considered less likely. Consider bronchoscopy versus short-term follow-up by CT in 1-2 months. Mild hypermetabolic bilateral hilar lymphadenopathy, which may be reactive in etiology. No evidence of extra-thoracic disease.  CT chest 04/08/18 - CT demonstrates evidence multifocal infection as well as pulmonary infarction in the setting of subacute/developing chronic pulmonary embolism.  OV 04/30/18 - shortness of breath and cough Patient presents today with shortness of breath and cough.  He was last seen by me on 04/11/2018 to discuss PET scan results.  He states that since his recent hospital discharge he has been doing worse.  His cough and shortness of breath are progressively worsening.  His cough is productive of clear sputum.  He has had a 10 pound weight loss in the past 2 months unintentional.  His weight loss is total of 20 pounds since this past July.  He is compliant with his Spiriva and his PCP switched his Spiriva to the Respimat.  He has oxygen at home as needed for decreased O2 sats.   His vital signs in office are stable today.  He is afebrile today.  His O2 sats in office today are 90% on room air.  His follow-up CT scan has been scheduled for February per Dr. Matilde Bash recommendation.  He is still on Xarelto for PE.  He has an upcoming follow-up scheduled with cardiology.     No Known Allergies   There is no immunization history on file for this patient.  Past Medical History:  Diagnosis Date  . Chronic back pain   . Emphysema lung ([place])   . Tobacco abuse     Tobacco History: Social History   Tobacco Use  Smoking Status Former Smoker  . Types: Cigarettes  Smokeless Tobacco Never Used   Counseling given: Yes   Outpatient Encounter Medications as of 04/29/2018  Medication Sig  . albuterol (PROVENTIL HFA;VENTOLIN HFA) 108 (90 Base) MCG/ACT inhaler Inhale 2 puffs into the lungs every 4 (four) hours as needed for wheezing or shortness of breath.  . metoprolol succinate (TOPROL-XL) 25 MG 24 hr tablet Take 1 tablet (25 mg total) by mouth daily.  Marland Kitchen oxyCODONE-acetaminophen (PERCOCET/ROXICET) 5-325 MG tablet Take 1-2 tablets by mouth every 6 (six) hours as needed for moderate pain.   . pantoprazole (PROTONIX) 40 MG tablet Take 1 tablet (40 mg total) by mouth daily.  . Rivaroxaban 15 & 20 MG TBPK Take as directed on package: Start with one 15mg  tablet by mouth twice a day with food for 39 more doses. On Day 22 (ie 12/19), switch to one 20mg  tablet once a day with food.  . sacubitril-valsartan (ENTRESTO) 24-26 MG Take  1 tablet by mouth 2 (two) times daily.  Marland Kitchen tiotropium (SPIRIVA HANDIHALER) 18 MCG inhalation capsule Place 1 capsule (18 mcg total) into inhaler and inhale daily.  Marland Kitchen doxycycline (VIBRA-TABS) 100 MG tablet Take 1 tablet (100 mg total) by mouth 2 (two) times daily.  . predniSONE (DELTASONE) 10 MG tablet Take 3 tabs for 2 days, then 2 tabs for 2 days, then 1 tab for 2 days, then stop   No facility-administered encounter medications on file as of 04/29/2018.       Review of Systems  Review of Systems  Constitutional: Negative.  Negative for chills and fever.  HENT: Negative.   Respiratory: Positive for cough and shortness of breath. Negative for wheezing.   Cardiovascular: Negative.  Negative for chest pain, palpitations and leg swelling.  Gastrointestinal: Negative.   Allergic/Immunologic: Negative.   Neurological: Negative.   Psychiatric/Behavioral: Negative.        Physical Exam  BP 116/68 (BP Location: Left Arm, Patient Position: Sitting, Cuff Size: Normal)   Pulse 69   Ht 5\' 8"  (1.727 m)   Wt 115 lb 9.6 oz (52.4 kg)   SpO2 90%   BMI 17.58 kg/m   Wt Readings from Last 5 Encounters:  04/29/18 115 lb 9.6 oz (52.4 kg)  04/11/18 115 lb 3.2 oz (52.3 kg)  03/27/18 115 lb 6.4 oz (52.3 kg)  03/19/18 125 lb (56.7 kg)  11/13/13 134 lb (60.8 kg)     Physical Exam Vitals signs and nursing note reviewed.  Constitutional:      General: He is not in acute distress.    Appearance: He is well-developed.  Cardiovascular:     Rate and Rhythm: Normal rate and regular rhythm.  Pulmonary:     Effort: Pulmonary effort is normal.     Breath sounds: Examination of the left-upper field reveals rhonchi. Examination of the left-middle field reveals rhonchi. Examination of the left-lower field reveals rhonchi. Rhonchi present.  Musculoskeletal:     Right lower leg: No edema.  Skin:    General: Skin is warm and dry.  Neurological:     Mental Status: He is alert and oriented to person, place, and time.     Imaging: Nm Pet Image Initial (pi) Skull Base To Thigh  Result Date: 04/08/2018 CLINICAL DATA:  Initial treatment strategy for pulmonary nodules. Recent pulmonary embolism and infarction. EXAM: NUCLEAR MEDICINE PET SKULL BASE TO THIGH TECHNIQUE: 5.9 mCi F-18 FDG was injected intravenously. Full-ring PET imaging was performed from the skull base to thigh after the radiotracer. CT data was obtained and used for attenuation correction and  anatomic localization. Fasting blood glucose: 84 mg/dl COMPARISON:  Chest CT on 03/18/2018 FINDINGS: (Background mediastinal blood pool activity: SUV max = 1.8) NECK:  No hypermetabolic lymph nodes or masses. Incidental CT findings:  None. CHEST: Mild hypermetabolic activity is seen corresponding to mild bilateral hilar lymphadenopathy seen by CT. SUV max measures 2.8 in both hilar regions. No hypermetabolic mediastinal or axillary lymphadenopathy identified. Extensive bilateral lower lobe airspace disease shows hypermetabolic activity. In addition, there are multifocal patchy and nodular areas of airspace opacity in both upper lobes which show hypermetabolic activity. This constellation of findings is highly suspicious for infectious or inflammatory process, with malignancy considered less likely. No evidence of pleural effusion. Incidental CT findings:  Mild-to-moderate centrilobular emphysema. ABDOMEN/PELVIS: No abnormal hypermetabolic activity within the liver, pancreas, adrenal glands, or spleen. No hypermetabolic lymph nodes in the abdomen or pelvis. Incidental CT findings:  None. SKELETON: No focal  hypermetabolic bone lesions to suggest skeletal metastasis. Incidental CT findings:  None. IMPRESSION: Extensive hypermetabolic bilateral lower lobe airspace disease, most likely infectious or inflammatory etiology. Multifocal and nodular areas of airspace disease in both upper lobes are also hypermetabolic, and favor inflammatory or infectious process, with malignancy considered less likely. Consider bronchoscopy versus short-term follow-up by CT in 1-2 months. Mild hypermetabolic bilateral hilar lymphadenopathy, which may be reactive in etiology. No evidence of extra-thoracic disease. Electronically Signed   By: Earle Gell M.D.   On: 04/08/2018 17:20     Assessment & Plan:   Acute pulmonary embolism (HCC) Continue Xarelto as directed  Abnormal CT scan of lung Concerned about patient.  His cough and  shortness of breath has worsened.  He has had significant unintentional weight loss.  Will trial another round of antibiotics and prednisone.  We will have him follow-up with Dr. Vaughan Browner as first available appointment.  Patient Instructions  Will order doxycycline Will order prednisone taper Continue Spiriva respimat May take mucinex May take delsym Continue O2 as needed to keep sats above 90% Will reschedule follow up appointment with Dr. Vaughan Browner for a sooner appointment        Fenton Foy, NP 04/30/2018

## 2018-04-29 NOTE — Progress Notes (Signed)
@Patient  ID: Caleb Harper, male    DOB: 1961-04-22, 58 y.o.   MRN: [id number]  Chief Complaint  Patient presents with  . Shortness of Breath    with cough. Has not improved since last vist.    Referring provider: [name], L.Marlou Sa, MD  HPI  58 year old male smoker with history of CHF and emphysema followed by Dr. Vaughan Browner.  Tests/recent events: November 2019 -admitted to the hospital for acute respiratory failure in the setting of bilateral pulmonary embolism, lung infiltrates with nodules suspicious for pneumonia.  Also found to have chronic systolic heart failure.  He completed 3 rounds of antibiotics.  PET 04/08/18 - Extensive hypermetabolic bilateral lower lobe airspace disease, most likely infectious or inflammatory etiology. Multifocal and nodular areas of airspace disease in both upper lobes are also hypermetabolic, and favor inflammatory or infectious process, with malignancy considered less likely. Consider bronchoscopy versus short-term follow-up by CT in 1-2 months. Mild hypermetabolic bilateral hilar lymphadenopathy, which may be reactive in etiology. No evidence of extra-thoracic disease.  CT chest 04/08/18 - CT demonstrates evidence multifocal infection as well as pulmonary infarction in the setting of subacute/developing chronic pulmonary embolism.  OV 04/29/18 - chest congestion/cogh  No Known Allergies   There is no immunization history on file for this patient.  Past Medical History:  Diagnosis Date  . Chronic back pain   . Emphysema lung ([place])   . Tobacco abuse     Tobacco History: Social History   Tobacco Use  Smoking Status Former Smoker  . Types: Cigarettes  Smokeless Tobacco Never Used   Counseling given: Not Answered   Outpatient Encounter Medications as of 04/29/2018  Medication Sig  . albuterol (PROVENTIL HFA;VENTOLIN HFA) 108 (90 Base) MCG/ACT inhaler Inhale 2 puffs into the lungs every 4 (four) hours as needed for wheezing or shortness of  breath.  . metoprolol succinate (TOPROL-XL) 25 MG 24 hr tablet Take 1 tablet (25 mg total) by mouth daily.  Marland Kitchen oxyCODONE-acetaminophen (PERCOCET/ROXICET) 5-325 MG tablet Take 1-2 tablets by mouth every 6 (six) hours as needed for moderate pain.   . pantoprazole (PROTONIX) 40 MG tablet Take 1 tablet (40 mg total) by mouth daily.  . Rivaroxaban 15 & 20 MG TBPK Take as directed on package: Start with one 15mg  tablet by mouth twice a day with food for 39 more doses. On Day 22 (ie 12/19), switch to one 20mg  tablet once a day with food.  . sacubitril-valsartan (ENTRESTO) 24-26 MG Take 1 tablet by mouth 2 (two) times daily.  Marland Kitchen tiotropium (SPIRIVA HANDIHALER) 18 MCG inhalation capsule Place 1 capsule (18 mcg total) into inhaler and inhale daily.   No facility-administered encounter medications on file as of 04/29/2018.      Review of Systems  Review of Systems     Physical Exam  Ht 5\' 8"  (1.727 m)   Wt 115 lb 9.6 oz (52.4 kg)   BMI 17.58 kg/m   Wt Readings from Last 5 Encounters:  04/29/18 115 lb 9.6 oz (52.4 kg)  04/11/18 115 lb 3.2 oz (52.3 kg)  03/27/18 115 lb 6.4 oz (52.3 kg)  03/19/18 125 lb (56.7 kg)  11/13/13 134 lb (60.8 kg)     Physical Exam   Lab Results:  CBC    Component Value Date/Time   WBC 11.8 (H) 03/21/2018 0437   RBC 4.87 03/21/2018 0437   HGB 14.1 03/21/2018 0437   HCT 44.7 03/21/2018 0437   PLT 323 03/21/2018 0437   MCV 91.8 03/21/2018  0437   MCH 29.0 03/21/2018 0437   MCHC 31.5 03/21/2018 0437   RDW 12.3 03/21/2018 0437   LYMPHSABS 2.9 03/19/2018 0946   MONOABS 1.4 (H) 03/19/2018 0946   EOSABS 0.1 03/19/2018 0946   BASOSABS 0.1 03/19/2018 0946    BMET    Component Value Date/Time   NA 138 03/21/2018 0437   K 4.1 03/21/2018 0437   CL 104 03/21/2018 0437   CO2 26 03/21/2018 0437   GLUCOSE 95 03/21/2018 0437   BUN 12 03/21/2018 0437   CREATININE 1.04 03/21/2018 0437   CALCIUM 9.2 03/21/2018 0437   GFRNONAA >60 03/21/2018 0437   GFRAA >60  03/21/2018 0437    BNP    Component Value Date/Time   BNP 190.9 (H) 03/19/2018 0946    ProBNP No results found for: PROBNP  Imaging: Nm Pet Image Initial (pi) Skull Base To Thigh  Result Date: 04/08/2018 CLINICAL DATA:  Initial treatment strategy for pulmonary nodules. Recent pulmonary embolism and infarction. EXAM: NUCLEAR MEDICINE PET SKULL BASE TO THIGH TECHNIQUE: 5.9 mCi F-18 FDG was injected intravenously. Full-ring PET imaging was performed from the skull base to thigh after the radiotracer. CT data was obtained and used for attenuation correction and anatomic localization. Fasting blood glucose: 84 mg/dl COMPARISON:  Chest CT on 03/18/2018 FINDINGS: (Background mediastinal blood pool activity: SUV max = 1.8) NECK:  No hypermetabolic lymph nodes or masses. Incidental CT findings:  None. CHEST: Mild hypermetabolic activity is seen corresponding to mild bilateral hilar lymphadenopathy seen by CT. SUV max measures 2.8 in both hilar regions. No hypermetabolic mediastinal or axillary lymphadenopathy identified. Extensive bilateral lower lobe airspace disease shows hypermetabolic activity. In addition, there are multifocal patchy and nodular areas of airspace opacity in both upper lobes which show hypermetabolic activity. This constellation of findings is highly suspicious for infectious or inflammatory process, with malignancy considered less likely. No evidence of pleural effusion. Incidental CT findings:  Mild-to-moderate centrilobular emphysema. ABDOMEN/PELVIS: No abnormal hypermetabolic activity within the liver, pancreas, adrenal glands, or spleen. No hypermetabolic lymph nodes in the abdomen or pelvis. Incidental CT findings:  None. SKELETON: No focal hypermetabolic bone lesions to suggest skeletal metastasis. Incidental CT findings:  None. IMPRESSION: Extensive hypermetabolic bilateral lower lobe airspace disease, most likely infectious or inflammatory etiology. Multifocal and nodular areas  of airspace disease in both upper lobes are also hypermetabolic, and favor inflammatory or infectious process, with malignancy considered less likely. Consider bronchoscopy versus short-term follow-up by CT in 1-2 months. Mild hypermetabolic bilateral hilar lymphadenopathy, which may be reactive in etiology. No evidence of extra-thoracic disease. Electronically Signed   By: Earle Gell M.D.   On: 04/08/2018 17:20     Assessment & Plan:   No problem-specific Assessment & Plan notes found for this encounter.     Fenton Foy, NP 04/29/2018

## 2018-04-30 ENCOUNTER — Encounter: Payer: Self-pay | Admitting: Nurse Practitioner

## 2018-04-30 DIAGNOSIS — R918 Other nonspecific abnormal finding of lung field: Secondary | ICD-10-CM | POA: Insufficient documentation

## 2018-04-30 NOTE — Assessment & Plan Note (Addendum)
Concerned about patient.  His cough and shortness of breath has worsened.  He has had significant unintentional weight loss.  Will trial another round of antibiotics and prednisone.  We will have him follow-up with Dr. Vaughan Browner as first available appointment.  Patient Instructions  Will order doxycycline Will order prednisone taper Continue Spiriva respimat May take mucinex May take delsym Continue O2 as needed to keep sats above 90% Will reschedule follow up appointment with Dr. Vaughan Browner for a sooner appointment

## 2018-04-30 NOTE — Assessment & Plan Note (Signed)
Continue Xarelto as directed

## 2018-05-05 DIAGNOSIS — R918 Other nonspecific abnormal finding of lung field: Secondary | ICD-10-CM | POA: Diagnosis not present

## 2018-05-05 DIAGNOSIS — Z0189 Encounter for other specified special examinations: Secondary | ICD-10-CM | POA: Diagnosis not present

## 2018-05-05 DIAGNOSIS — I509 Heart failure, unspecified: Secondary | ICD-10-CM | POA: Diagnosis not present

## 2018-05-05 DIAGNOSIS — I2699 Other pulmonary embolism without acute cor pulmonale: Secondary | ICD-10-CM | POA: Diagnosis not present

## 2018-05-07 ENCOUNTER — Encounter: Payer: Self-pay | Admitting: Pulmonary Disease

## 2018-05-07 ENCOUNTER — Ambulatory Visit (INDEPENDENT_AMBULATORY_CARE_PROVIDER_SITE_OTHER): Payer: BLUE CROSS/BLUE SHIELD | Admitting: Pulmonary Disease

## 2018-05-07 VITALS — BP 98/62 | HR 59 | Ht 69.0 in | Wt 119.6 lb

## 2018-05-07 DIAGNOSIS — J449 Chronic obstructive pulmonary disease, unspecified: Secondary | ICD-10-CM

## 2018-05-07 DIAGNOSIS — I2699 Other pulmonary embolism without acute cor pulmonale: Secondary | ICD-10-CM | POA: Diagnosis not present

## 2018-05-07 DIAGNOSIS — R918 Other nonspecific abnormal finding of lung field: Secondary | ICD-10-CM

## 2018-05-07 LAB — CBC WITH DIFFERENTIAL/PLATELET
Basophils Absolute: 0.1 10*3/uL (ref 0.0–0.1)
Basophils Relative: 0.6 % (ref 0.0–3.0)
Eosinophils Absolute: 0.1 10*3/uL (ref 0.0–0.7)
Eosinophils Relative: 0.7 % (ref 0.0–5.0)
HCT: 42 % (ref 39.0–52.0)
Hemoglobin: 14.1 g/dL (ref 13.0–17.0)
Lymphocytes Relative: 22 % (ref 12.0–46.0)
Lymphs Abs: 2.7 10*3/uL (ref 0.7–4.0)
MCHC: 33.5 g/dL (ref 30.0–36.0)
MCV: 90.5 fl (ref 78.0–100.0)
Monocytes Absolute: 1 10*3/uL (ref 0.1–1.0)
Monocytes Relative: 7.9 % (ref 3.0–12.0)
Neutro Abs: 8.5 10*3/uL — ABNORMAL HIGH (ref 1.4–7.7)
Neutrophils Relative %: 68.8 % (ref 43.0–77.0)
Platelets: 333 10*3/uL (ref 150.0–400.0)
RBC: 4.64 Mil/uL (ref 4.22–5.81)
RDW: 13.4 % (ref 11.5–15.5)
WBC: 12.3 10*3/uL — AB (ref 4.0–10.5)

## 2018-05-07 MED ORDER — IPRATROPIUM-ALBUTEROL 0.5-2.5 (3) MG/3ML IN SOLN: 3.0000 mL | Freq: Four times a day (QID) | RESPIRATORY_TRACT | 3 refills | Status: AC | PRN

## 2018-05-07 MED ORDER — FLUTICASONE-UMECLIDIN-VILANT 100-62.5-25 MCG/INH IN AEPB: 1.0000 | INHALATION_SPRAY | Freq: Every day | RESPIRATORY_TRACT | 3 refills | Status: AC

## 2018-05-07 MED ORDER — HYDROCOD POLST-CPM POLST ER 10-8 MG/5ML PO SUER: 5.0000 mL | Freq: Two times a day (BID) | ORAL | 0 refills | Status: DC | PRN

## 2018-05-07 MED ORDER — PREDNISONE 20 MG PO TABS: 60.0000 mg | ORAL_TABLET | Freq: Every day | ORAL | 0 refills | Status: DC

## 2018-05-07 NOTE — Patient Instructions (Addendum)
Will order CT chest to be done sooner than planned We will start prednisone at 60 mg.  Continue at this dose and full reevaluation in office We will stop the Spiriva and start you on a new inhaler called Trelegy We will order nebulizer machine and duo nebs every 6 hours as needed We will give you a cough medication called Tussionex We will check some labs today including CBC differential, IgE, ANA, rheumatoid factor, CCP  Follow up as scheduled next week

## 2018-05-07 NOTE — Progress Notes (Signed)
Caleb Harper    [id number]    24-Aug-1960  Primary Care Physician:Mitchell, L.Marlou Sa, MD  Referring Physician: [name], [name], [address] [employer] [address] [place], [place] [address]  Chief complaint: Follow-up after recent hospitalization for PE, pneumonia,?  Malignancy  HPI: 58 year old smoker with history of chronic systolic heart failure [EF 35-40%], emphysema.  Admitted in late November for acute respiratory failure in the setting of bilateral pulmonary embolism, lung infiltrates with nodules suspicious for pneumonia.  Also found to have chronic systolic heart failure  Per history he had already completed 3 rounds of antibiotics as an outpatient.  Procalcitonin was not elevated hence he was not given any additional antibiotic therapy He was started on anticoagulation and discharged to be followed up with pulmonary clinic  Currently on Spiriva inhaler.  Reports chronic dyspnea on exertion, cough without mucus production.  Pets: No pets Occupation: Works as an Clinical biochemist Exposures: No known exposures, no known heart, [place] Smoking history: 30-pack-year smoker.  Quit in October 2019 Travel history: No relevant travel history Relevant family history: Father had COPD.  Interim history: Seen as an acute visit earlier this month for ongoing cough, dyspnea.  Given short prednisone taper and doxycycline He states that there is no improvement in symptoms.  Continues to have dyspnea, weakness, wheezing with productive cough and chest tightness.  He is frustrated about his condition and the lack of improvement in symptoms.  Outpatient Encounter Medications as of 05/07/2018  Medication Sig  . albuterol (PROVENTIL HFA;VENTOLIN HFA) 108 (90 Base) MCG/ACT inhaler Inhale 2 puffs into the lungs every 4 (four) hours as needed for wheezing or shortness of breath.  . doxycycline (VIBRA-TABS) 100 MG tablet Take 1 tablet (100 mg total) by mouth 2 (two) times daily.  . metoprolol  succinate (TOPROL-XL) 25 MG 24 hr tablet Take 1 tablet (25 mg total) by mouth daily.  Marland Kitchen oxyCODONE-acetaminophen (PERCOCET/ROXICET) 5-325 MG tablet Take 1-2 tablets by mouth every 6 (six) hours as needed for moderate pain.   . pantoprazole (PROTONIX) 40 MG tablet Take 1 tablet (40 mg total) by mouth daily.  . predniSONE (DELTASONE) 10 MG tablet Take 3 tabs for 2 days, then 2 tabs for 2 days, then 1 tab for 2 days, then stop  . Rivaroxaban 15 & 20 MG TBPK Take as directed on package: Start with one 15mg  tablet by mouth twice a day with food for 39 more doses. On Day 22 (ie 12/19), switch to one 20mg  tablet once a day with food.  . sacubitril-valsartan (ENTRESTO) 24-26 MG Take 1 tablet by mouth 2 (two) times daily.  Marland Kitchen tiotropium (SPIRIVA HANDIHALER) 18 MCG inhalation capsule Place 1 capsule (18 mcg total) into inhaler and inhale daily.   No facility-administered encounter medications on file as of 05/07/2018.     Allergies as of 05/07/2018  . (No Known Allergies)    Past Medical History:  Diagnosis Date  . Chronic back pain   . Emphysema lung ([place])   . Tobacco abuse     Past Surgical History:  Procedure Laterality Date  . BACK SURGERY      Family History  Problem Relation Age of Onset  . Cancer Mother   . COPD Father     Social History   Socioeconomic History  . Marital status: Single    Spouse name: Not on file  . Number of children: Not on file  . Years of education: Not on file  . Highest  education level: Not on file  Occupational History  . Not on file  Social Needs  . Financial resource strain: Not on file  . Food insecurity:    Worry: Not on file    Inability: Not on file  . Transportation needs:    Medical: Not on file    Non-medical: Not on file  Tobacco Use  . Smoking status: Former Smoker    Types: Cigarettes  . Smokeless tobacco: Never Used  Substance and Sexual Activity  . Alcohol use: Yes  . Drug use: No  . Sexual activity: Not on file  Lifestyle    . Physical activity:    Days per week: Not on file    Minutes per session: Not on file  . Stress: Not on file  Relationships  . Social connections:    Talks on phone: Not on file    Gets together: Not on file    Attends religious service: Not on file    Active member of club or organization: Not on file    Attends meetings of clubs or organizations: Not on file    Relationship status: Not on file  . Intimate partner violence:    Fear of current or ex partner: Not on file    Emotionally abused: Not on file    Physically abused: Not on file    Forced sexual activity: Not on file  Other Topics Concern  . Not on file  Social History Narrative  . Not on file   Review of systems: Review of Systems  Constitutional: Negative for fever and chills.  HENT: Negative.   Eyes: Negative for blurred vision.  Respiratory: as per HPI  Cardiovascular: Negative for chest pain and palpitations.  Gastrointestinal: Negative for vomiting, diarrhea, blood per rectum. Genitourinary: Negative for dysuria, urgency, frequency and hematuria.  Musculoskeletal: Negative for myalgias, back pain and joint pain.  Skin: Negative for itching and rash.  Neurological: Negative for dizziness, tremors, focal weakness, seizures and loss of consciousness.  Endo/Heme/Allergies: Negative for environmental allergies.  Psychiatric/Behavioral: Negative for depression, suicidal ideas and hallucinations.  All other systems reviewed and are negative.  Physical Exam: Blood pressure 122/64, pulse 60, height 5\' 8"  (1.727 m), weight 115 lb 6.4 oz (52.3 kg), SpO2 90 %. Gen:      No acute distress HEENT:  EOMI, sclera anicteric Neck:     No masses; no thyromegaly Lungs:    Clear to auscultation bilaterally; normal respiratory effort CV:         Regular rate and rhythm; no murmurs Abd:      + bowel sounds; soft, non-tender; no palpable masses, no distension Ext:    No edema; adequate peripheral perfusion Skin:      Warm and  dry; no rash Neuro: alert and oriented x 3 Psych: normal mood and affect  Data Reviewed: Imaging: CT chest 03/18/2018- Bilateral nodular, groundglass opacities predominantly in the right lower lobe. Based pulmonary embolism of the right lower lobe with recannulization compatible with subacute to early chronic DVT with associated pulmonary infarction.  Esophageal thickening, aortic atherosclerosis.  PET scan 04/08/2018- extensive hypermetabolic bilateral lower lobe airspace disease likely infectious or inflammatory.  Malignancy is considered less likely. I have reviewed the images personally.  PFTs: 04/02/2018 FVC 3.23 [70%), FEV1 2.08 (59%), F/F 64, TLC 34% Moderate obstruction, severe diffusion defect.  Labs: CBC 03/19/2018-WBC 15, eos 0%  Assessment:  Moderate COPD Continues to be symptomatic with cough, chest tightness, weakness We will give her another  prolonged prednisone taper.  Do not feel he will require additional antibiotics We will stop the Spiriva and start him on Trelegy inhaler Order nebulizer with duo nebs every 4 hours as needed Tussionex for cough.  Abnormal CT with bilateral nodular infiltrates PET scan is not very suggestive of malignancy. The findings are suggestive of an infectious/inflammatory process and less likely to be malignancy  He is due to get a follow-up CT in February. As he continues to be symptomatic we will get a repeat CT earlier than planned this week.  If there are persistent abnormalities which are not improving then will go ahead with the bronchoscope. Labs today including CBC differential, IgE, ANA, rheumatoid factor, CCP  Pulmonary embolism Continue anticoagulation  Plan/Recommendations: - CT chest to be done sooner than February. - Prednisone starting at 60 mg.  Continue at this dose until you can be reevaluated next week - Stop Spiriva, start Trelegy - Duo nebs as needed - Tussionex for cough - Check CBC differential, IgE, ANA,  rheumatoid factor, CCP  Marshell Garfinkel MD [facility] 05/07/2018, 1:37 PM  CC: [name], L.Marlou Sa, MD

## 2018-05-08 ENCOUNTER — Telehealth: Payer: Self-pay | Admitting: Pulmonary Disease

## 2018-05-08 DIAGNOSIS — J449 Chronic obstructive pulmonary disease, unspecified: Secondary | ICD-10-CM

## 2018-05-08 NOTE — Telephone Encounter (Signed)
Spoke with pt, he states Dr. Vaughan Browner forgot to call in the Tussionex to his pharmacy. He also stated he has the medication for the nebulizer but they haven't contacted him about the machine. There wasn't an order in Epic so I placed the order for a nebulizer. Dr. Vaughan Browner can you send in tussionex?  Patient Instructions by Marshell Garfinkel, MD at 05/07/2018 1:30 PM  Author: Marshell Garfinkel, MD Author Type: Physician Filed: 05/07/2018 2:04 PM  Not     e Status: Addendum Cosign: Cosign Not Required Encounter Date: 05/07/2018  Editor: Marshell Garfinkel, MD (Physician)  Prior Versions: 1. Marshell Garfinkel, MD (Physician) at 05/07/2018 2:03 PM - Signed    Will order CT chest to be done sooner than planned We will start prednisone at 60 mg.  Continue at this dose and full reevaluation in office We will stop the Spiriva and start you on a new inhaler called Trelegy We will order nebulizer machine and duo nebs every 6 hours as needed We will give you a cough medication called Tussionex We will check some labs today including CBC differential, IgE, ANA, rheumatoid factor, CCP  Follow up as scheduled next week

## 2018-05-09 LAB — ANA: Anti Nuclear Antibody(ANA): NEGATIVE

## 2018-05-09 LAB — RHEUMATOID FACTOR: Rheumatoid fact SerPl-aCnc: 17 IU/mL — ABNORMAL HIGH (ref <14)

## 2018-05-09 LAB — CYCLIC CITRUL PEPTIDE ANTIBODY, IGG: Cyclic Citrullin Peptide Ab: 33 UNITS — ABNORMAL HIGH

## 2018-05-09 LAB — IGE: IGE (IMMUNOGLOBULIN E), SERUM: 201 kU/L — AB (ref <=114)

## 2018-05-09 MED ORDER — HYDROCOD POLST-CPM POLST ER 10-8 MG/5ML PO SUER: 5.0000 mL | Freq: Two times a day (BID) | ORAL | 0 refills | Status: AC | PRN

## 2018-05-09 NOTE — Telephone Encounter (Signed)
Spoke with pt, he states he did pick up the RX already. Spoke with with the tech at [name] and she stated that the pt did not receive an additional prescription. Caleb Harper.

## 2018-05-09 NOTE — Telephone Encounter (Signed)
Called and spoke with patient today regarding Tussionex rx This script needs to be signed and authorization by Dr. Vaughan Browner  Dr. Vaughan Browner, can you please sign for this order today for patient to have it over the weekend please. Thank you.

## 2018-05-09 NOTE — Telephone Encounter (Signed)
Primary Pulmonologist: Dr. Vaughan Browner Last office visit and with whom: Dr. Vaughan Browner on 05/07/2018 What do we see them for (pulmonary problems): COPD, SOB Last OV assessment/plan:  Marshell Garfinkel, MD (Physician) at 05/07/2018 2:03 PM - Signed    Will order CT chest to be done sooner than planned We will start prednisone at 60 mg.  Continue at this dose and full reevaluation in office We will stop the Spiriva and start you on a new inhaler called Trelegy We will order nebulizer machine and duo nebs every 6 hours as needed We will give you a cough medication called Tussionex We will check some labs today including CBC differential, IgE, ANA, rheumatoid factor, CCP  Follow up as scheduled next week     Was appointment offered to patient (explain)?  No appt offered   Reason for call: Patient states the Tussionex was not at the pharmacy as of this morning. Dr. Vaughan Browner needs to sign for this script, and we have tried him since yesterday. He is out of the office today. Routing message to B.Mack as he is DOD this morning.  B.Mack please advise if you are able to send in Tussionex for this patient therefore he can have it this weekend. Thanks.

## 2018-05-09 NOTE — Telephone Encounter (Signed)
Caleb Harper

## 2018-05-09 NOTE — Telephone Encounter (Signed)
Okay thank you so much.Aaron Edelman

## 2018-05-09 NOTE — Telephone Encounter (Signed)
05/09/2018 1251  Before prescribing the patient with Tussionex, I have checked [place] PMP aware and the patients overdose risk score is 130. Patient has 2 providers prescribing controlled substances. Patient has used 1 pharmacies.    Please counsel the patient on the sedative effects of Tussionex. Patient to use this medication sparingly and not when driving, drinking alcohol, or using additional sedative medications.  Patient also needs to be aware that this medication is sedating he does chronically take pain medication which is also sedating.  Patient has been prescribed 240 mL with no refills.   Please also notify patient's pharmacy that we have sent in an additional prescription of Tussionex.  The one that was initially prescribed to the patient on 05/07/2018 needs to be voided.  I believe that was accidentally done as they may have printed the prescription out of habit but I have sent this electronically.  Also ensure that the patient never did pick this up.  It does not appear that the patient has picked it up per [place] PMP aware.  Wyn Quaker, FNP

## 2018-05-09 NOTE — Telephone Encounter (Signed)
I called [facility] and I was on hold for over 7 minutes. I will try to call later.   ATC pt, no answer. Left message for pt to call back.

## 2018-05-12 ENCOUNTER — Ambulatory Visit
Admission: RE | Admit: 2018-05-12 | Discharge: 2018-05-12 | Disposition: A | Discharge status: Home / Self Care | Payer: BLUE CROSS/BLUE SHIELD | Source: Ambulatory Visit | Attending: Nurse Practitioner | Admitting: Nurse Practitioner

## 2018-05-12 DIAGNOSIS — R918 Other nonspecific abnormal finding of lung field: Secondary | ICD-10-CM | POA: Diagnosis not present

## 2018-05-12 DIAGNOSIS — R911 Solitary pulmonary nodule: Secondary | ICD-10-CM

## 2018-05-13 ENCOUNTER — Telehealth: Payer: Self-pay | Admitting: Pulmonary Disease

## 2018-05-13 ENCOUNTER — Encounter: Payer: Self-pay | Admitting: Pulmonary Disease

## 2018-05-13 ENCOUNTER — Ambulatory Visit (INDEPENDENT_AMBULATORY_CARE_PROVIDER_SITE_OTHER): Payer: BLUE CROSS/BLUE SHIELD | Admitting: Pulmonary Disease

## 2018-05-13 ENCOUNTER — Other Ambulatory Visit (HOSPITAL_COMMUNITY): Payer: Self-pay

## 2018-05-13 VITALS — BP 106/68 | HR 46 | Ht 69.0 in | Wt 118.4 lb

## 2018-05-13 DIAGNOSIS — J449 Chronic obstructive pulmonary disease, unspecified: Secondary | ICD-10-CM | POA: Diagnosis not present

## 2018-05-13 DIAGNOSIS — R059 Cough, unspecified: Secondary | ICD-10-CM

## 2018-05-13 DIAGNOSIS — R1310 Dysphagia, unspecified: Secondary | ICD-10-CM

## 2018-05-13 DIAGNOSIS — R05 Cough: Secondary | ICD-10-CM | POA: Diagnosis not present

## 2018-05-13 MED ORDER — ENOXAPARIN SODIUM 40 MG/0.4ML ~~LOC~~ SOLN: 40.0000 mg | Freq: Two times a day (BID) | SUBCUTANEOUS | 0 refills | Status: DC

## 2018-05-13 NOTE — Telephone Encounter (Signed)
Spoke with the pt and notified of recs and he verbalized understanding

## 2018-05-13 NOTE — Telephone Encounter (Signed)
Spoke with pt, he states he is not going to give himself Lovenox shot because he doesn't like getting shots. He also mentioned that his instructions stated that he should stop Eliquis and he reported that he is not taking this medication and has never been on this medication. Dr. Vaughan Browner please advise.

## 2018-05-13 NOTE — Progress Notes (Signed)
Caleb Harper    [id number]    03-07-1961  Primary Care Physician:Mitchell, L.Marlou Sa, MD  Referring Physician: [name], [name], [place] [address] [place], [place] [address]  Chief complaint: Follow-up after recent hospitalization for PE, pneumonia,?  Malignancy  HPI: 58 year old smoker with history of chronic systolic heart failure [EF 35-40%], emphysema.  Admitted in late November for acute respiratory failure in the setting of bilateral pulmonary embolism, lung infiltrates with nodules suspicious for pneumonia.  Also found to have chronic systolic heart failure  Per history he had already completed 3 rounds of antibiotics as an outpatient.  Procalcitonin was not elevated hence he was not given any additional antibiotic therapy He was started on anticoagulation and discharged to be followed up with pulmonary clinic  Currently on Spiriva inhaler.  Reports chronic dyspnea on exertion, cough without mucus production.  Pets: No pets Occupation: Works as an Clinical biochemist Exposures: No known exposures, no known heart, [place] Smoking history: 30-pack-year smoker.  Quit in October 2019 Travel history: No relevant travel history Relevant family history: Father had COPD.  Interim history: No improvement in symptoms. Continues to have dyspnea, weakness, wheezing with productive cough and chest tightness.  He is frustrated about his condition and the lack of improvement in symptoms.  Outpatient Encounter Medications as of 05/13/2018  Medication Sig  . albuterol (PROVENTIL HFA;VENTOLIN HFA) 108 (90 Base) MCG/ACT inhaler Inhale 2 puffs into the lungs every 4 (four) hours as needed for wheezing or shortness of breath.  . chlorpheniramine-HYDROcodone (TUSSIONEX PENNKINETIC ER) 10-8 MG/5ML SUER Take 5 mLs by mouth every 12 (twelve) hours as needed for cough.  . Fluticasone-Umeclidin-Vilant (TRELEGY ELLIPTA) 100-62.5-25 MCG/INH AEPB Inhale 1 puff into the lungs daily.  Marland Kitchen  ipratropium-albuterol (DUONEB) 0.5-2.5 (3) MG/3ML SOLN Take 3 mLs by nebulization every 6 (six) hours as needed.  . metoprolol succinate (TOPROL-XL) 25 MG 24 hr tablet Take 1 tablet (25 mg total) by mouth daily.  Marland Kitchen oxyCODONE-acetaminophen (PERCOCET/ROXICET) 5-325 MG tablet Take 1-2 tablets by mouth every 6 (six) hours as needed for moderate pain.   . pantoprazole (PROTONIX) 40 MG tablet Take 1 tablet (40 mg total) by mouth daily.  . predniSONE (DELTASONE) 20 MG tablet Take 3 tablets (60 mg total) by mouth daily with breakfast.  . Rivaroxaban 15 & 20 MG TBPK Take as directed on package: Start with one 15mg  tablet by mouth twice a day with food for 39 more doses. On Day 22 (ie 12/19), switch to one 20mg  tablet once a day with food.  . sacubitril-valsartan (ENTRESTO) 24-26 MG Take 1 tablet by mouth 2 (two) times daily.  . [DISCONTINUED] tiotropium (SPIRIVA HANDIHALER) 18 MCG inhalation capsule Place 1 capsule (18 mcg total) into inhaler and inhale daily.   No facility-administered encounter medications on file as of 05/13/2018.     Allergies as of 05/13/2018 - Review Complete 05/13/2018  Allergen Reaction Noted  . Chantix [varenicline tartrate] Nausea And Vomiting 05/13/2018    Past Medical History:  Diagnosis Date  . Chronic back pain   . Emphysema lung ([place])   . Tobacco abuse     Past Surgical History:  Procedure Laterality Date  . BACK SURGERY      Family History  Problem Relation Age of Onset  . Cancer Mother   . COPD Father     Social History   Socioeconomic History  . Marital status: Single    Spouse name: Not on file  . Number  of children: Not on file  . Years of education: Not on file  . Highest education level: Not on file  Occupational History  . Not on file  Social Needs  . Financial resource strain: Not on file  . Food insecurity:    Worry: Not on file    Inability: Not on file  . Transportation needs:    Medical: Not on file    Non-medical: Not on file    Tobacco Use  . Smoking status: Former Smoker    Types: Cigarettes    Last attempt to quit: 02/12/2018    Years since quitting: 0.2  . Smokeless tobacco: Never Used  Substance and Sexual Activity  . Alcohol use: Yes  . Drug use: No  . Sexual activity: Not on file  Lifestyle  . Physical activity:    Days per week: Not on file    Minutes per session: Not on file  . Stress: Not on file  Relationships  . Social connections:    Talks on phone: Not on file    Gets together: Not on file    Attends religious service: Not on file    Active member of club or organization: Not on file    Attends meetings of clubs or organizations: Not on file    Relationship status: Not on file  . Intimate partner violence:    Fear of current or ex partner: Not on file    Emotionally abused: Not on file    Physically abused: Not on file    Forced sexual activity: Not on file  Other Topics Concern  . Not on file  Social History Narrative  . Not on file   Review of systems: Review of Systems  Constitutional: Negative for fever and chills.  HENT: Negative.   Eyes: Negative for blurred vision.  Respiratory: as per HPI  Cardiovascular: Negative for chest pain and palpitations.  Gastrointestinal: Negative for vomiting, diarrhea, blood per rectum. Genitourinary: Negative for dysuria, urgency, frequency and hematuria.  Musculoskeletal: Negative for myalgias, back pain and joint pain.  Skin: Negative for itching and rash.  Neurological: Negative for dizziness, tremors, focal weakness, seizures and loss of consciousness.  Endo/Heme/Allergies: Negative for environmental allergies.  Psychiatric/Behavioral: Negative for depression, suicidal ideas and hallucinations.  All other systems reviewed and are negative.  Physical Exam: Blood pressure 122/64, pulse 60, height 5\' 8"  (1.727 m), weight 115 lb 6.4 oz (52.3 kg), SpO2 90 %. Gen:      No acute distress HEENT:  EOMI, sclera anicteric Neck:     No masses;  no thyromegaly Lungs:    Clear to auscultation bilaterally; normal respiratory effort CV:         Regular rate and rhythm; no murmurs Abd:      + bowel sounds; soft, non-tender; no palpable masses, no distension Ext:    No edema; adequate peripheral perfusion Skin:      Warm and dry; no rash Neuro: alert and oriented x 3 Psych: normal mood and affect  Data Reviewed: Imaging: CT chest 03/18/2018- Bilateral nodular, groundglass opacities predominantly in the right lower lobe. Based pulmonary embolism of the right lower lobe with recannulization compatible with subacute to early chronic DVT with associated pulmonary infarction.  Esophageal thickening, aortic atherosclerosis.  PET scan 04/08/2018- extensive hypermetabolic bilateral lower lobe airspace disease likely infectious or inflammatory.  Malignancy is considered less likely.  CT scan 05/12/2018- assessment bilateral multilobular consolidation with groundglass opacities, tree-in-bud with dependent distribution.  Mild progression since  last scan.  Severe emphysema. I reviewed the images personally.  PFTs: 04/02/2018 FVC 3.23 [70%), FEV1 2.08 (59%), F/F 64, TLC 34% Moderate obstruction, severe diffusion defect.  Labs: CBC 03/19/2018-WBC 15, eos 0%  Assessment:  Moderate COPD Continue Trelegy, duo nebs Supplemental oxygen.  Abnormal CT with bilateral nodular infiltrates PET scan is not very suggestive of malignancy. The findings are suggestive of an infectious/inflammatory process and less likely to be malignancy  Follow-up CT reviewed with progressive findings, possible fibrosis at the bases Diagnosis is unclear.  It could be an untreated pneumonia which I feel is unlikely as he has received multiple rounds of antibiotic.Marland Kitchen  Other possibilities include recurrent aspiration, cryptogenic organizing pneumonia. We will continue prednisone at 60 mg Schedule for bronchoscope next week for further evaluation.  Discussed risk benefits in  detail with patient and he is agreed to proceed. Modified barium swallow and esophagram for evaluation of aspiration  Pulmonary embolism Continue anticoagulation [place] with Lovenox for bronchoscopy.  Plan/Recommendations: Continue prednisone Schedule bronchoscopy Modified barium swallow and esophagram  More then 1/2 the time of the 40 min visit was spent in counseling and/or coordination of care with the patient and family.  Marshell Garfinkel MD  Pulmonary and Critical Care 05/13/2018, 9:22 AM  CC: [name], L.Marlou Sa, MD

## 2018-05-13 NOTE — Telephone Encounter (Signed)
The CT scan that we did yesterday does not evaluate for PE. We usually do not recheck for PE as long as he is continuing the treatment with blood thinners.

## 2018-05-13 NOTE — Telephone Encounter (Signed)
Returned call from Patient. He is unsure what Dr Vaughan Browner said this morning at hid OV, about his PE.  He is concerned that he still has a PE.  Went over AVS from 05/13/18, OV with Patient.  Dr. Vaughan Browner, please advise on PE

## 2018-05-13 NOTE — Patient Instructions (Addendum)
We have schedule you for a bronchoscope at 9 AM, [place] on 05/22/2018 Stop taking Eliquis on Monday 1/27 We will order Lovenox 50 mg/kg every 12 hours starting Monday.  Stop taking the last dose of Lovenox on Wed morning.  Do not take the Lovenox dose wed evening dose before procedure Do not take anything by mouth after midnight on Wednesday  We will send order for modified barium swallow and speech pathology evaluation. We will give a letter for you to stay out of work for the next 1 month while you are being evaluated Follow-up in 3 weeks.

## 2018-05-14 ENCOUNTER — Telehealth: Payer: Self-pay | Admitting: Pulmonary Disease

## 2018-05-14 NOTE — Telephone Encounter (Addendum)
After speaking with [name], pt can be placed on injection schedule for Lovenox. Per Dr. Vaughan Browner verbally- pt to have 60mg  Lovenox daily for three days starting 05/19/18, although Rx was written for 76ml BID.  Pt has been scheduled for appt on 05/19/18, 05/20/18, 05/21/18 at 10:00p. Pt also stated that he is not taking Eliquis, however he is taking xarelto daily per Dr. Vaughan Browner.  Nothing further needed.

## 2018-05-14 NOTE — Telephone Encounter (Signed)
Instructions   We have schedule you for a bronchoscope at 9 AM, [place] on 05/22/2018 Stop taking Eliquis on Monday 1/27 We will order Lovenox 50 mg/kg every 12 hours starting Monday.  Stop taking the last dose of Lovenox on Wed morning.  Do not take the Lovenox dose wed evening dose before procedure Do not take anything by mouth after midnight on Wednesday     Called and spoke with pt who stated he has been scheduled to receive lovenox injections 3 days at our office beginning next Monday, 05/19/2018 at 10am. Pt stated to me he had enough injections for about 6 days and wanted to know if he was supposed to schedule more appts to receive the injections.  I stated to pt that the instructions that were stated to him at [place] was to stop Eliquis 05/19/2018 and to receive lovenox beginning then and to have another injection 1/28 followed by another injection 1/29 and stated to him after the injection 1/29, he was to stop due to the procedure being the following day.  Stated to him when he came to the office 1/27, he could ask if he was to do more than the three scheduled days. Pt expressed understanding and stated he would do that. Nothing further needed.

## 2018-05-15 ENCOUNTER — Telehealth: Payer: Self-pay | Admitting: Pulmonary Disease

## 2018-05-15 NOTE — Telephone Encounter (Signed)
I spoke with the pt and notified of recs per Dr. Vaughan Browner  He verbalized understanding  Nothing further needed

## 2018-05-15 NOTE — Telephone Encounter (Signed)
Patient is calling to see if he should be taking his medication before his DG Esophagus tomorrow. Since he was advise not to eat or Drink 6 hours prior.  Dr. Vaughan Browner please advise

## 2018-05-15 NOTE — Telephone Encounter (Signed)
He can hold his medication and take them after the procedure

## 2018-05-16 ENCOUNTER — Other Ambulatory Visit: Payer: Self-pay | Admitting: Pulmonary Disease

## 2018-05-16 ENCOUNTER — Ambulatory Visit (HOSPITAL_COMMUNITY)
Admission: RE | Admit: 2018-05-16 | Discharge: 2018-05-16 | Disposition: A | Discharge status: Home / Self Care | Payer: BLUE CROSS/BLUE SHIELD | Source: Ambulatory Visit | Attending: Pulmonary Disease | Admitting: Pulmonary Disease

## 2018-05-16 DIAGNOSIS — Z720 Tobacco use: Secondary | ICD-10-CM | POA: Insufficient documentation

## 2018-05-16 DIAGNOSIS — R05 Cough: Secondary | ICD-10-CM | POA: Insufficient documentation

## 2018-05-16 DIAGNOSIS — R059 Cough, unspecified: Secondary | ICD-10-CM

## 2018-05-16 DIAGNOSIS — R1312 Dysphagia, oropharyngeal phase: Secondary | ICD-10-CM

## 2018-05-16 DIAGNOSIS — R918 Other nonspecific abnormal finding of lung field: Secondary | ICD-10-CM | POA: Diagnosis not present

## 2018-05-16 DIAGNOSIS — R1310 Dysphagia, unspecified: Secondary | ICD-10-CM

## 2018-05-16 DIAGNOSIS — J439 Emphysema, unspecified: Secondary | ICD-10-CM

## 2018-05-16 NOTE — Progress Notes (Signed)
Modified Barium Swallow Progress Note  Patient Details  Name: Caleb Harper MRN: [id number] Date of Birth: [birth year]  Today's Date: 05/16/2018  Modified Barium Swallow completed.  Full report located under Chart Review in the Imaging Section.  Brief recommendations include the following:  Clinical Impression  Pt has a mild oropharyngeal dysphagia with trace, silent aspiration of thin liquids. This appears to be primarily an issue of impaired timing, as pt triggers a swallow quickly but with the bolus reaching the pyriform sinuses before airway closure begins, allowing for very small amounts of thin liquids to enter the airway before the swallow. Although use of different strategies (chin tuck, bolus hold, breath hold) help to improve this timing, unfortunately he also has mild residue with thin liquids, which still is silently aspirated after the swallow. Since residue is not noted with any other consistency, and he otherwise appears to have adequate swallow efficiency, suspect that this may be related to possible esophageal component (esophagram completed after this study). When cued to cough he is able to expel penetrates and aspirates that remain just below the true vocal folds, but at times aspiration is deep enough that he cannot cough to clear it. No airway compromise occurs with nectar thick liquids or solids. Suspect that aspiration, although very trace in nature, is occurring throughout meals and throughout the day. In light of his ongoing respiratory issues, would favor being more conservative at this point in time as work up is still ongoing and pt is still symptomatic. This would include regular diet textures but use of nectar thick liquids. Pt could have small, single sips of plain water per the water protocol, using a cough immediately post-swallow. Recommendations were reviewed with pt in detail and written information was provided. Would consider OP SLP f/u to maximize safety.     Swallow Evaluation Recommendations       SLP Diet Recommendations: Regular solids;Nectar thick liquid;Other (Comment)(small sips of water in between meals per water protocol)   Liquid Administration via: Cup;Straw   Medication Administration: Whole meds with puree   Supervision: Patient able to self feed   Compensations: Slow rate;Small sips/bites;Other (Comment)(with water, cough after swallowing)   Postural Changes: Seated upright at 90 degrees;Remain semi-upright after after feeds/meals (Comment)   Oral Care Recommendations: Oral care BID;Other (Comment)(clean mouth thoroughly before any water)        Venita Sheffield Rane Dumm 05/16/2018,9:56 AM   Nuala Alpha, M.A. [place] Acute Environmental education officer [phone] Office [phone]

## 2018-05-17 ENCOUNTER — Other Ambulatory Visit: Payer: Self-pay | Admitting: Cardiology

## 2018-05-17 DIAGNOSIS — I428 Other cardiomyopathies: Secondary | ICD-10-CM

## 2018-05-17 DIAGNOSIS — I509 Heart failure, unspecified: Secondary | ICD-10-CM

## 2018-05-19 ENCOUNTER — Encounter (HOSPITAL_COMMUNITY): Payer: BLUE CROSS/BLUE SHIELD

## 2018-05-19 ENCOUNTER — Ambulatory Visit (INDEPENDENT_AMBULATORY_CARE_PROVIDER_SITE_OTHER): Payer: BLUE CROSS/BLUE SHIELD

## 2018-05-19 ENCOUNTER — Other Ambulatory Visit: Payer: Self-pay | Admitting: Pulmonary Disease

## 2018-05-19 DIAGNOSIS — R918 Other nonspecific abnormal finding of lung field: Secondary | ICD-10-CM

## 2018-05-19 MED ORDER — ENOXAPARIN SODIUM 150 MG/ML ~~LOC~~ SOLN
40.0000 mg | Freq: Once | SUBCUTANEOUS | Status: AC
  Administered 2018-05-19: 20 mg via SUBCUTANEOUS

## 2018-05-19 MED ORDER — RIVAROXABAN 20 MG PO TABS: 20.0000 mg | ORAL_TABLET | Freq: Every day | ORAL | 0 refills | Status: AC

## 2018-05-19 MED ORDER — ENOXAPARIN SODIUM 150 MG/ML ~~LOC~~ SOLN
40.0000 mg | Freq: Once | SUBCUTANEOUS | Status: AC
  Administered 2018-05-19: 40 mg via SUBCUTANEOUS

## 2018-05-19 MED ORDER — PANTOPRAZOLE SODIUM 40 MG PO TBEC: 40.0000 mg | DELAYED_RELEASE_TABLET | Freq: Every day | ORAL | 1 refills | Status: AC

## 2018-05-19 MED ORDER — ALBUTEROL SULFATE HFA 108 (90 BASE) MCG/ACT IN AERS: 2.0000 | INHALATION_SPRAY | RESPIRATORY_TRACT | 3 refills | Status: AC | PRN

## 2018-05-19 MED ORDER — PREDNISONE 20 MG PO TABS: 60.0000 mg | ORAL_TABLET | Freq: Every day | ORAL | 0 refills | Status: AC

## 2018-05-19 NOTE — Progress Notes (Signed)
Patient arrived for injection of Lovenox. Patient brought it own medication from home. Patient tolerated well.  While patient was receiving injections he had a list of medications he wanted refilled. Dr. Vaughan Browner states okay to order medications requested by patient.  Predisone was ordered only for 10 days, will route message to Dr. Vaughan Browner to address after procedure on Thursday with patient.  Also patient was feeling more Short of Breath every day he wakes up. O2 sats were 86% had patient sit down bump O2 up to 2.5-L and take good deep breaths. O2 sats came up to 93% prior to patient leaving office.   Instructed patient he would come back and do the same thing tomorrow.   Nothing further needed.

## 2018-05-20 ENCOUNTER — Ambulatory Visit (INDEPENDENT_AMBULATORY_CARE_PROVIDER_SITE_OTHER): Payer: BLUE CROSS/BLUE SHIELD

## 2018-05-20 ENCOUNTER — Telehealth: Payer: Self-pay | Admitting: Pulmonary Disease

## 2018-05-20 DIAGNOSIS — R0602 Shortness of breath: Secondary | ICD-10-CM

## 2018-05-20 DIAGNOSIS — R918 Other nonspecific abnormal finding of lung field: Secondary | ICD-10-CM

## 2018-05-20 MED ORDER — ENOXAPARIN SODIUM 150 MG/ML ~~LOC~~ SOLN
40.0000 mg | Freq: Once | SUBCUTANEOUS | Status: AC
  Administered 2018-05-20: 20 mg via SUBCUTANEOUS

## 2018-05-20 MED ORDER — ENOXAPARIN SODIUM 150 MG/ML ~~LOC~~ SOLN
40.0000 mg | Freq: Once | SUBCUTANEOUS | Status: AC
  Administered 2018-05-20: 40 mg via SUBCUTANEOUS

## 2018-05-20 NOTE — Telephone Encounter (Signed)
Levada Dy states patient wants something to carry on his shoulder and told patient to call our office to switch to another DME. Pt's insurance has made 2 payments to [facility] for O2.   Pt is able to change DME but will need OV with NP and document chronic steroid use; walk pt to qualify again and send order to [facility] STAT  Pt has been scheduled to see TP tomorrow at 10:15am. I will speak with Tonya in the morning as she has gone for the day

## 2018-05-21 ENCOUNTER — Encounter: Payer: Self-pay | Admitting: Nurse Practitioner

## 2018-05-21 ENCOUNTER — Ambulatory Visit: Payer: BLUE CROSS/BLUE SHIELD

## 2018-05-21 ENCOUNTER — Ambulatory Visit (INDEPENDENT_AMBULATORY_CARE_PROVIDER_SITE_OTHER): Payer: BLUE CROSS/BLUE SHIELD | Admitting: Nurse Practitioner

## 2018-05-21 VITALS — BP 102/62 | HR 81 | Ht 69.0 in | Wt 118.0 lb

## 2018-05-21 DIAGNOSIS — F419 Anxiety disorder, unspecified: Secondary | ICD-10-CM | POA: Diagnosis not present

## 2018-05-21 DIAGNOSIS — R0609 Other forms of dyspnea: Secondary | ICD-10-CM | POA: Diagnosis not present

## 2018-05-21 DIAGNOSIS — R0600 Dyspnea, unspecified: Secondary | ICD-10-CM

## 2018-05-21 DIAGNOSIS — R918 Other nonspecific abnormal finding of lung field: Secondary | ICD-10-CM | POA: Diagnosis not present

## 2018-05-21 MED ORDER — LORAZEPAM 0.5 MG PO TABS: 0.5000 mg | ORAL_TABLET | Freq: Two times a day (BID) | ORAL | 0 refills | Status: AC | PRN

## 2018-05-21 NOTE — Assessment & Plan Note (Signed)
Patient is experiencing anxiety due to upcoming procedure tomorrow and is worried about what the results will be.  He is experiencing panic attacks and this in turn makes him short of breath as well.  He requested something for anxiety today.  I will order a couple of Ativan to help with anxiety this afternoon before going into this procedure.

## 2018-05-21 NOTE — Assessment & Plan Note (Signed)
Continue anticoagulation as directed.

## 2018-05-21 NOTE — Telephone Encounter (Signed)
Correction, pt is seeing Tonya today NOT Tammy.  Will route message to [facility] as she is working with [place] today.

## 2018-05-21 NOTE — Telephone Encounter (Signed)
[facility] is aware of the appt today Pt is coming in to be qualified  On POC Nothing further needed.

## 2018-05-21 NOTE — Patient Instructions (Addendum)
Continue current medications Procedure is scheduled for tomorrow morning Please follow aspiration precautions Continue O2 as needed at 2-4 L [place] as needed with exertion to keep sats above 90% NPO after midnight tonight Will give ativan for this afternoon - patient is very anxious about upcoming procedure Please go to the ED if symptoms worsen this afternoon for admission

## 2018-05-21 NOTE — Assessment & Plan Note (Signed)
Concerned about patient today.  He has been declining over the past couple of weeks.  He has been more short of breath requiring continuous oxygen over the past 3 to 4 days.  He is scheduled for a bronch with Dr. Vaughan Browner in the morning.  We advised patient that it would be safest to go ahead with admission today, but patient refused. Dr. Vaughan Browner is aware of patient's condition and advised patient that he will be admitted tomorrow if there are any complications during procedure. Patient will be started on antibiotic tomorrow. Discussed the importance of adherence to aspiration precautions.

## 2018-05-21 NOTE — Assessment & Plan Note (Signed)
Concerned about patient today.  He has been declining over the past couple of weeks.  He has been more short of breath requiring continuous oxygen over the past 3 to 4 days.  He is scheduled for a bronch with Dr. Vaughan Browner in the morning.  We advised patient that it would be safest to go ahead with admission today, but patient refused. Dr. Vaughan Browner is aware of patient's condition and advised patient that he will be admitted tomorrow if there are any complications during procedure. Patient will be started on antibiotic tomorrow. Discussed the importance of adherence to aspiration precautions.   Patient Instructions  Continue current medications Procedure is scheduled for tomorrow morning Please follow aspiration precautions Continue O2 as needed at 2-4 L [place] as needed with exertion to keep sats above 90% NPO after midnight tonight Will give ativan for this afternoon - patient is very anxious about upcoming procedure Please go to the ED if symptoms worsen this afternoon for admission

## 2018-05-21 NOTE — Progress Notes (Addendum)
@Patient  ID: Caleb Harper, male    DOB: Nov 13, 1960, 58 y.o.   MRN: [id number]  Chief Complaint  Patient presents with  . Shortness of Breath    Referring provider: [name], L.Marlou Sa, MD  HPI 58 year old male former smoker with history of CHF and emphysema followed by Dr. Vaughan Browner.  Tests/recent events:  November 2019-admitted to the hospital for acute respiratory failure in the setting of bilateral pulmonary embolism, lung infiltrates with nodules suspicious for pneumonia. Also found to have chronic systolic heart failure. He has completed 3 rounds of antibiotics.  PET 04/08/18 -Extensive hypermetabolic bilateral lower lobe airspace disease, most likely infectious or inflammatory etiology. Multifocal and nodular areas of airspace disease in both upper lobes are also hypermetabolic, and favor inflammatory or infectious process, with malignancy considered less likely. Consider bronchoscopy versus short-term follow-up by CT in 1-2 months. Mild hypermetabolic bilateral hilar lymphadenopathy, which may be reactive in etiology. No evidence of extra-thoracic disease.  CT chest 05/12/18 -CT demonstrates evidence multifocal infection as well as pulmonary infarction in the setting of subacute/developing chronic pulmonary embolism.   OV 05/21/18 - Acute shortness of breath Presents today with shortness of breath.  He states that since this past Friday he has been more short of breath with exertion.  He has been wearing O2 at 2 - 2-1/2 L nasal cannula with exertion only but since Friday he has wore his oxygen continuous.  He states with exertion his O2 sats do drop to upper 80s.  He also states that he has been wheezing some over the past few days.  He had a recent swallow study and was advised by speech therapy to use nectar thickened liquids which he admits that he has not been doing.  He did not like the texture of the thickened liquids.  He is compliant with his medications.  He is on  prednisone daily, DuoNeb, Proventil, and Trelegy.  He has been on Xarelto for recent PE and has been coming to the office for Lovenox injections.  He denies any recent fever.  Denies any sinus congestion.  He denies any recent sick contacts.  He denies any chest pain or edema.  Patient states that he is extremely anxious over upcoming procedures very concerned about what the outcome will be.  Has been having panic attacks over the last few days which is causing him to be even more short of breath.  He is requesting something for anxiety today.     Allergies  Allergen Reactions  . Chantix [Varenicline Tartrate] Nausea And Vomiting     There is no immunization history on file for this patient.  Past Medical History:  Diagnosis Date  . Chronic back pain   . Emphysema lung ([place])   . Tobacco abuse     Tobacco History: Social History   Tobacco Use  Smoking Status Former Smoker  . Types: Cigarettes  . Last attempt to quit: 02/12/2018  . Years since quitting: 0.2  Smokeless Tobacco Never Used   Counseling given: Yes   Outpatient Encounter Medications as of 05/21/2018  Medication Sig  . albuterol (PROVENTIL HFA;VENTOLIN HFA) 108 (90 Base) MCG/ACT inhaler Inhale 2 puffs into the lungs every 4 (four) hours as needed for wheezing or shortness of breath.  . chlorpheniramine-HYDROcodone (TUSSIONEX PENNKINETIC ER) 10-8 MG/5ML SUER Take 5 mLs by mouth every 12 (twelve) hours as needed for cough.  . enoxaparin (LOVENOX) 40 MG/0.4ML injection Inject 0.4 mLs (40 mg total) into the skin every 12 (twelve) hours.  Marland Kitchen  Fluticasone-Umeclidin-Vilant (TRELEGY ELLIPTA) 100-62.5-25 MCG/INH AEPB Inhale 1 puff into the lungs daily.  Marland Kitchen ipratropium-albuterol (DUONEB) 0.5-2.5 (3) MG/3ML SOLN Take 3 mLs by nebulization every 6 (six) hours as needed. (Patient taking differently: Take 3 mLs by nebulization every 6 (six) hours as needed (for shortness of breath or wheezing). )  . metoprolol succinate (TOPROL-XL) 25  MG 24 hr tablet Take 1 tablet (25 mg total) by mouth daily.  Marland Kitchen oxyCODONE-acetaminophen (PERCOCET/ROXICET) 5-325 MG tablet Take 1-2 tablets by mouth every 4 (four) hours as needed for moderate pain (Max of 5 tablets per day).   . pantoprazole (PROTONIX) 40 MG tablet Take 1 tablet (40 mg total) by mouth daily.  . predniSONE (DELTASONE) 20 MG tablet Take 3 tablets (60 mg total) by mouth daily with breakfast.  . rivaroxaban (XARELTO) 20 MG TABS tablet Take 1 tablet (20 mg total) by mouth daily with supper.  . sacubitril-valsartan (ENTRESTO) 49-51 MG Take 1 tablet by mouth 2 (two) times daily.  Marland Kitchen LORazepam (ATIVAN) 0.5 MG tablet Take 1 tablet (0.5 mg total) by mouth 2 (two) times daily as needed for up to 2 days for anxiety.   No facility-administered encounter medications on file as of 05/21/2018.      Review of Systems  Review of Systems  Constitutional: Negative.  Negative for chills and fever.  HENT: Negative.   Respiratory: Positive for cough, shortness of breath and wheezing.   Cardiovascular: Negative.  Negative for chest pain, palpitations and leg swelling.  Gastrointestinal: Negative.   Allergic/Immunologic: Negative.   Neurological: Negative.   Psychiatric/Behavioral: Negative.        Physical Exam  BP 102/62 (BP Location: Left Arm, Cuff Size: Normal)   Pulse 81   Ht 5\' 9"  (1.753 m)   Wt 118 lb (53.5 kg)   SpO2 91%   BMI 17.43 kg/m   Wt Readings from Last 5 Encounters:  05/21/18 118 lb (53.5 kg)  05/13/18 118 lb 6.4 oz (53.7 kg)  05/07/18 119 lb 9.6 oz (54.3 kg)  04/29/18 115 lb 9.6 oz (52.4 kg)  04/11/18 115 lb 3.2 oz (52.3 kg)     Physical Exam Vitals signs and nursing note reviewed.  Constitutional:      General: He is not in acute distress.    Appearance: He is well-developed.  Cardiovascular:     Rate and Rhythm: Normal rate and regular rhythm.  Pulmonary:     Effort: Pulmonary effort is normal.     Breath sounds: Normal breath sounds. No decreased  breath sounds, wheezing or rhonchi.  Musculoskeletal:     Right lower leg: No edema.  Skin:    General: Skin is warm and dry.  Neurological:     Mental Status: He is alert and oriented to person, place, and time.      Imaging: Ct Chest Wo Contrast  Result Date: 05/12/2018 CLINICAL DATA:  Former smoker. Emphysema. Follow-up nodular and consolidative lung opacities. EXAM: CT CHEST WITHOUT CONTRAST TECHNIQUE: Multidetector CT imaging of the chest was performed following the standard protocol without IV contrast. COMPARISON:  03/18/2018 chest CT.  04/08/2018 PET-CT. FINDINGS: Cardiovascular: Normal heart size. No significant pericardial effusion/thickening. Atherosclerotic nonaneurysmal thoracic aorta. Stable top-normal caliber main pulmonary artery (3.1 cm diameter). Mediastinum/Nodes: No discrete thyroid nodules. Unremarkable esophagus. No pathologically enlarged axillary, mediastinal or hilar lymph nodes, noting limited sensitivity for the detection of hilar adenopathy on this noncontrast study. Lungs/Pleura: No pneumothorax. Trace dependent basilar pleural effusions bilaterally. Severe centrilobular emphysema. There is similar severe  patchy and bandlike consolidation with surrounding patchy ground-glass opacity throughout both lower lobes and right middle lobe with associated worsening volume loss in the lower lobes and worsening bronchiectasis in the right lower lobe. There is increased patchy consolidation and ground-glass opacity in the dependent right upper lobe. Patchy peripheral bandlike and nodular consolidation throughout the left upper lobe is overall mildly worsened, with some of the previously noted nodular foci of consolidation in the peripheral upper lobe now obscured by bandlike consolidation. Patchy tree-in-bud type opacities are noted at the periphery of the upper lobes bilaterally, increased. No discrete lung masses. Upper abdomen: No acute abnormality. Musculoskeletal:  No aggressive  appearing focal osseous lesions. IMPRESSION: 1. Persistent severe multilobar consolidative airspace disease with surrounding patchy ground-glass and tree-in-bud opacities, with an overall dependent distribution in the lungs. This process has overall mildly progressed since 03/18/2018 chest CT particularly in the dependent upper lobes, with some progressive fibrosis in the lower lobes. No discrete lung masses. The findings are indeterminate. A chronic/recurrent infectious or inflammatory process remains favored, such as recurrent aspiration. A neoplastic process is considered less likely. Esophagram and/or speech therapy consultation suggested for further evaluation. 2. Trace dependent basilar pleural effusions bilaterally. 3. Severe centrilobular emphysema Aortic Atherosclerosis (ICD10-I70.0) and Emphysema (ICD10-J43.9). Electronically Signed   By: Ilona Sorrel M.D.   On: 05/12/2018 15:02   Dg Swallow Func Speech Path  Result Date: 05/16/2018 Objective Swallowing Evaluation: Type of Study: Bedside Swallow Evaluation  Patient Details Name: BRIGHTEN ORNDOFF MRN: [id number] Date of Birth: Apr 05, 1961 Today's Date: 05/16/2018 Time: SLP Start Time (ACUTE ONLY): 3614 -SLP Stop Time (ACUTE ONLY): 0914 SLP Time Calculation (min) (ACUTE ONLY): 37 min Past Medical History: Past Medical History: Diagnosis Date . Chronic back pain  . Emphysema lung ([place])  . Tobacco abuse  Past Surgical History: Past Surgical History: Procedure Laterality Date . BACK SURGERY   HPI: Pt is a 58 yo male who is referred for OP MBS by his pulmonologist due to concern for aspiration. CT Chest on 05/12/18 showed persistent severe multilobar consolidative airspace disease with surrounding patchy ground-glass and tree-in-bud opacities. PMH includes: smoker, CHF, emphysema  Subjective: pt describes feeling like he needs to belch or regurgitate during meals Assessment / Plan / Recommendation CHL IP CLINICAL IMPRESSIONS 05/16/2018 Clinical Impression Pt has a  mild oropharyngeal dysphagia with trace, silent aspiration of thin liquids. This appears to be primarily an issue of impaired timing, as pt triggers a swallow quickly but with the bolus reaching the pyriform sinuses before airway closure begins, allowing for very small amounts of thin liquids to enter the airway before the swallow. Although use of different strategies (chin tuck, bolus hold, breath hold) help to improve this timing, unfortunately he also has mild residue with thin liquids, which still is silently aspirated after the swallow. Since residue is not noted with any other consistency, and he otherwise appears to have adequate swallow efficiency, suspect that this may be related to possible esophageal component (esophagram completed after this study). When cued to cough he is able to expel penetrates and aspirates that remain just below the true vocal folds, but at times aspiration is deep enough that he cannot cough to clear it. No airway compromise occurs with nectar thick liquids or solids. Suspect that aspiration, although very trace in nature, is occurring throughout meals and throughout the day. In light of his ongoing respiratory issues, would favor being more conservative at this point in time as work up is still ongoing and  pt is still symptomatic. This would include regular diet textures but use of nectar thick liquids. Pt could have small, single sips of plain water per the water protocol, using a cough immediately post-swallow. Recommendations were reviewed with pt in detail and written information was provided. Would consider OP SLP f/u to maximize safety.  SLP Visit Diagnosis Dysphagia, pharyngeal phase (R13.13) Attention and concentration deficit following -- Frontal lobe and executive function deficit following -- Impact on safety and function Moderate aspiration risk   CHL IP TREATMENT RECOMMENDATION 05/16/2018 Treatment Recommendations Defer treatment plan to f/u with SLP   Prognosis  05/16/2018 Prognosis for Safe Diet Advancement Good Barriers to Reach Goals -- Barriers/Prognosis Comment -- CHL IP DIET RECOMMENDATION 05/16/2018 SLP Diet Recommendations Regular solids;Nectar thick liquid;Other (Comment) Liquid Administration via Cup;Straw Medication Administration Whole meds with puree Compensations Slow rate;Small sips/bites;Other (Comment) Postural Changes Seated upright at 90 degrees;Remain semi-upright after after feeds/meals (Comment)   CHL IP OTHER RECOMMENDATIONS 05/16/2018 Recommended Consults -- Oral Care Recommendations Oral care BID;Other (Comment) Other Recommendations --   CHL IP FOLLOW UP RECOMMENDATIONS 05/16/2018 Follow up Recommendations Outpatient SLP   No flowsheet data found.     CHL IP ORAL PHASE 05/16/2018 Oral Phase WFL Oral - Pudding Teaspoon -- Oral - Pudding Cup -- Oral - Honey Teaspoon -- Oral - Honey Cup -- Oral - Nectar Teaspoon -- Oral - Nectar Cup -- Oral - Nectar Straw -- Oral - Thin Teaspoon -- Oral - Thin Cup -- Oral - Thin Straw -- Oral - Puree -- Oral - Mech Soft -- Oral - Regular -- Oral - Multi-Consistency -- Oral - Pill -- Oral Phase - Comment --  CHL IP PHARYNGEAL PHASE 05/16/2018 Pharyngeal Phase Impaired Pharyngeal- Pudding Teaspoon -- Pharyngeal -- Pharyngeal- Pudding Cup -- Pharyngeal -- Pharyngeal- Honey Teaspoon -- Pharyngeal -- Pharyngeal- Honey Cup -- Pharyngeal -- Pharyngeal- Nectar Teaspoon -- Pharyngeal -- Pharyngeal- Nectar Cup WFL Pharyngeal -- Pharyngeal- Nectar Straw WFL Pharyngeal -- Pharyngeal- Thin Teaspoon -- Pharyngeal -- Pharyngeal- Thin Cup Delayed swallow initiation-pyriform sinuses;Penetration/Aspiration before swallow;Penetration/Apiration after swallow;Pharyngeal residue - valleculae;Pharyngeal residue - pyriform;Compensatory strategies attempted (with notebox) Pharyngeal Material enters airway, passes BELOW cords without attempt by patient to eject out (silent aspiration) Pharyngeal- Thin Straw Delayed swallow initiation-pyriform  sinuses;Penetration/Aspiration before swallow;Penetration/Apiration after swallow;Pharyngeal residue - valleculae;Pharyngeal residue - pyriform;Compensatory strategies attempted (with notebox) Pharyngeal Material enters airway, passes BELOW cords without attempt by patient to eject out (silent aspiration) Pharyngeal- Puree WFL Pharyngeal -- Pharyngeal- Mechanical Soft WFL Pharyngeal -- Pharyngeal- Regular -- Pharyngeal -- Pharyngeal- Multi-consistency -- Pharyngeal -- Pharyngeal- Pill -- Pharyngeal -- Pharyngeal Comment --  CHL IP CERVICAL ESOPHAGEAL PHASE 05/16/2018 Cervical Esophageal Phase WFL Pudding Teaspoon -- Pudding Cup -- Honey Teaspoon -- Honey Cup -- Nectar Teaspoon -- Nectar Cup -- Nectar Straw -- Thin Teaspoon -- Thin Cup -- Thin Straw -- Puree -- Mechanical Soft -- Regular -- Multi-consistency -- Pill -- Cervical Esophageal Comment -- Talbert Nan 05/16/2018, 9:57 AM  Germain Osgood Nix, M.A. CCC-SLP Acute Rehabilitation Services Pager [phone] Office [phone]             Dg Esophagus W Double Cm (hd)  Result Date: 05/16/2018 CLINICAL DATA:  Chronic cough and shortness of breath. Dependent airspace opacities in both lungs on CT. EXAM: ESOPHOGRAM / BARIUM SWALLOW / BARIUM TABLET STUDY TECHNIQUE: Combined double contrast and single contrast examination performed using effervescent crystals, thick barium liquid, and thin barium liquid. The patient was observed with fluoroscopy swallowing a 13 mm barium  sulphate tablet. FLUOROSCOPY TIME:  Fluoroscopy Time: 1 minutes and 6 seconds of low-dose pulsed fluoro Radiation Exposure Index (if provided by the fluoroscopic device): 7.6 mGy Number of Acquired Spot Images: None. COMPARISON:  Chest CT 05/12/2018. Images from modified speech study 05/16/2018. FINDINGS: The esophageal motility appears normal aside from mild tertiary contractions. No evidence of esophageal mass, ulceration or stricture. There is no significant hiatal hernia. No aspiration was  demonstrated during this portion of the study. See separate report for details of the pharynx. There is no significant gastroesophageal reflux with the water siphon test. A 13 mm barium tablet was administered and passed without delay into the stomach. IMPRESSION: 1. Mildly prominent tertiary contraction/presbyesophagus without significant dysmotility. 2. No evidence of stricture or focal mass lesion. 3. See separate report for dedicated evaluation of the pharynx. Electronically Signed   By: Richardean Sale M.D.   On: 05/16/2018 09:45     Assessment & Plan:   Abnormal CT scan of lung Concerned about patient today.  He has been declining over the past couple of weeks.  He has been more short of breath requiring continuous oxygen over the past 3 to 4 days.  He is scheduled for a bronch with Dr. Vaughan Browner in the morning.  We advised patient that it would be safest to go ahead with admission today, but patient refused. Dr. Vaughan Browner is aware of patient's condition and advised patient that he will be admitted tomorrow if there are any complications during procedure. Patient will be started on antibiotic tomorrow. Discussed the importance of adherence to aspiration precautions.    Dyspnea on exertion Concerned about patient today.  He has been declining over the past couple of weeks.  He has been more short of breath requiring continuous oxygen over the past 3 to 4 days.  He is scheduled for a bronch with Dr. Vaughan Browner in the morning.  We advised patient that it would be safest to go ahead with admission today, but patient refused. Dr. Vaughan Browner is aware of patient's condition and advised patient that he will be admitted tomorrow if there are any complications during procedure. Patient will be started on antibiotic tomorrow. Discussed the importance of adherence to aspiration precautions.   Patient Instructions  Continue current medications Procedure is scheduled for tomorrow morning Please follow aspiration  precautions Continue O2 as needed at 2-4 L [place] as needed with exertion to keep sats above 90% NPO after midnight tonight Will give ativan for this afternoon - patient is very anxious about upcoming procedure Please go to the ED if symptoms worsen this afternoon for admission    Acute pulmonary embolism ([place]) Continue anticoagulation as directed.  Anxiety Patient is experiencing anxiety due to upcoming procedure tomorrow and is worried about what the results will be.  He is experiencing panic attacks and this in turn makes him short of breath as well.  He requested something for anxiety today.  I will order a couple of Ativan to help with anxiety this afternoon before going into this procedure.     Fenton Foy, NP 05/21/2018

## 2018-05-22 ENCOUNTER — Ambulatory Visit (HOSPITAL_BASED_OUTPATIENT_CLINIC_OR_DEPARTMENT_OTHER)
Admission: RE | Admit: 2018-05-22 | Discharge: 2018-05-22 | Disposition: A | Discharge status: Home / Self Care | Payer: BLUE CROSS/BLUE SHIELD | Source: Ambulatory Visit | Attending: Pulmonary Disease | Admitting: Pulmonary Disease

## 2018-05-22 ENCOUNTER — Ambulatory Visit (HOSPITAL_COMMUNITY): Payer: BLUE CROSS/BLUE SHIELD

## 2018-05-22 ENCOUNTER — Ambulatory Visit (HOSPITAL_COMMUNITY)
Admission: RE | Admit: 2018-05-22 | Discharge: 2018-05-22 | Disposition: A | Discharge status: Home / Self Care | Payer: BLUE CROSS/BLUE SHIELD | Attending: Pulmonary Disease | Admitting: Pulmonary Disease

## 2018-05-22 ENCOUNTER — Encounter (HOSPITAL_COMMUNITY): Payer: Self-pay | Admitting: Respiratory Therapy

## 2018-05-22 ENCOUNTER — Encounter (HOSPITAL_COMMUNITY)
Admission: RE | Disposition: A | Discharge status: Home / Self Care | Payer: Self-pay | Source: Home / Self Care | Attending: Pulmonary Disease

## 2018-05-22 DIAGNOSIS — J181 Lobar pneumonia, unspecified organism: Secondary | ICD-10-CM | POA: Insufficient documentation

## 2018-05-22 DIAGNOSIS — J439 Emphysema, unspecified: Secondary | ICD-10-CM | POA: Diagnosis not present

## 2018-05-22 DIAGNOSIS — R918 Other nonspecific abnormal finding of lung field: Secondary | ICD-10-CM | POA: Diagnosis not present

## 2018-05-22 DIAGNOSIS — R846 Abnormal cytological findings in specimens from respiratory organs and thorax: Secondary | ICD-10-CM | POA: Diagnosis not present

## 2018-05-22 DIAGNOSIS — J9601 Acute respiratory failure with hypoxia: Secondary | ICD-10-CM | POA: Diagnosis not present

## 2018-05-22 DIAGNOSIS — Z79899 Other long term (current) drug therapy: Secondary | ICD-10-CM | POA: Diagnosis not present

## 2018-05-22 DIAGNOSIS — Z7901 Long term (current) use of anticoagulants: Secondary | ICD-10-CM | POA: Diagnosis not present

## 2018-05-22 DIAGNOSIS — Z9889 Other specified postprocedural states: Secondary | ICD-10-CM

## 2018-05-22 DIAGNOSIS — Z86711 Personal history of pulmonary embolism: Secondary | ICD-10-CM | POA: Insufficient documentation

## 2018-05-22 DIAGNOSIS — Z87891 Personal history of nicotine dependence: Secondary | ICD-10-CM | POA: Diagnosis not present

## 2018-05-22 DIAGNOSIS — J984 Other disorders of lung: Secondary | ICD-10-CM | POA: Diagnosis not present

## 2018-05-22 DIAGNOSIS — Z79891 Long term (current) use of opiate analgesic: Secondary | ICD-10-CM | POA: Diagnosis not present

## 2018-05-22 DIAGNOSIS — I5022 Chronic systolic (congestive) heart failure: Secondary | ICD-10-CM | POA: Diagnosis not present

## 2018-05-22 DIAGNOSIS — Z7952 Long term (current) use of systemic steroids: Secondary | ICD-10-CM | POA: Diagnosis not present

## 2018-05-22 DIAGNOSIS — Z836 Family history of other diseases of the respiratory system: Secondary | ICD-10-CM | POA: Diagnosis not present

## 2018-05-22 DIAGNOSIS — Z7951 Long term (current) use of inhaled steroids: Secondary | ICD-10-CM | POA: Insufficient documentation

## 2018-05-22 HISTORY — PX: VIDEO BRONCHOSCOPY: SHX5072

## 2018-05-22 LAB — BODY FLUID CELL COUNT WITH DIFFERENTIAL
EOS FL: 0 %
LYMPHS FL: 11 %
Monocyte-Macrophage-Serous Fluid: 28 % — ABNORMAL LOW (ref 50–90)
Neutrophil Count, Fluid: 61 % — ABNORMAL HIGH (ref 0–25)
Total Nucleated Cell Count, Fluid: 190 cu mm (ref 0–1000)

## 2018-05-22 SURGERY — BRONCHOSCOPY, WITH FLUOROSCOPY
Anesthesia: Moderate Sedation | Laterality: Bilateral

## 2018-05-22 MED ORDER — LIDOCAINE HCL 1 % IJ SOLN
INTRAMUSCULAR | Status: DC | PRN
  Administered 2018-05-22: 6 mL via RESPIRATORY_TRACT

## 2018-05-22 MED ORDER — FENTANYL CITRATE (PF) 100 MCG/2ML IJ SOLN
INTRAMUSCULAR | Status: AC
  Filled 2018-05-22: qty 4

## 2018-05-22 MED ORDER — FENTANYL CITRATE (PF) 100 MCG/2ML IJ SOLN
INTRAMUSCULAR | Status: DC | PRN
  Administered 2018-05-22 (×5): 25 ug via INTRAVENOUS

## 2018-05-22 MED ORDER — LIDOCAINE HCL URETHRAL/MUCOSAL 2 % EX GEL
CUTANEOUS | Status: DC | PRN
  Administered 2018-05-22: 1

## 2018-05-22 MED ORDER — SODIUM CHLORIDE 0.9 % IV SOLN
INTRAVENOUS | Status: DC
  Administered 2018-05-22: 09:00:00 via INTRAVENOUS

## 2018-05-22 MED ORDER — PHENYLEPHRINE HCL 0.25 % NA SOLN
NASAL | Status: DC | PRN
  Administered 2018-05-22: 2 via NASAL

## 2018-05-22 MED ORDER — MIDAZOLAM HCL (PF) 10 MG/2ML IJ SOLN
INTRAMUSCULAR | Status: DC | PRN
  Administered 2018-05-22 (×6): 1 mg via INTRAVENOUS

## 2018-05-22 MED ORDER — BUTAMBEN-TETRACAINE-BENZOCAINE 2-2-14 % EX AERO: 1.0000 | INHALATION_SPRAY | Freq: Once | CUTANEOUS | Status: DC

## 2018-05-22 MED ORDER — MIDAZOLAM HCL (PF) 5 MG/ML IJ SOLN
INTRAMUSCULAR | Status: AC
  Filled 2018-05-22: qty 2

## 2018-05-22 MED ORDER — LIDOCAINE HCL 2 % EX GEL
1.0000 "application " | Freq: Once | CUTANEOUS | Status: DC
  Filled 2018-05-22: qty 4250

## 2018-05-22 MED ORDER — PHENYLEPHRINE HCL 0.25 % NA SOLN
1.0000 | Freq: Four times a day (QID) | NASAL | Status: DC | PRN
  Filled 2018-05-22: qty 15

## 2018-05-22 NOTE — Op Note (Signed)
[facility] Cardiopulmonary Patient Name: Caleb Harper Date: 05/22/2018 MRN: [id number] Attending MD: Marshell Garfinkel , MD Date of Birth: 1960/11/26 CSN: Finalized Age: 58 Admit Type: Outpatient Gender: Male Procedure:            Bronchoscopy Indications:          Unresolving bilateral infiltrates Providers:            Marshell Garfinkel, MD, Andre Lefort RRT,RCP, Christin Fudge Referring MD:          Medicines:             Complications:        No immediate complications Estimated Blood Loss: Estimated blood loss: none. Procedure:            Pre-Anesthesia Assessment:                       - A History and Physical has been performed. Patient                        meds and allergies have been reviewed. The risks and                        benefits of the procedure and the sedation options and                        risks were discussed with the patient. All questions                        were answered and informed consent was obtained.                        Patient identification and proposed procedure were                        verified prior to the procedure by the physician in the                        procedure room. Mental Status Examination: alert and                        oriented. Airway Examination: normal oropharyngeal                        airway. Respiratory Examination: clear to auscultation.                        CV Examination: normal. ASA Grade Assessment: II - A                        patient with mild systemic disease. After reviewing the                        risks and benefits, the patient was deemed in                        satisfactory condition to undergo the procedure. The  anesthesia plan was to use moderate sedation /                        analgesia (conscious sedation). Immediately prior to                        administration of medications, the patient was            re-assessed for adequacy to receive sedatives. The                        heart rate, respiratory rate, oxygen saturations, blood                        pressure, adequacy of pulmonary ventilation, and                        response to care were monitored throughout the                        procedure. The physical status of the patient was                        re-assessed after the procedure.                       After obtaining informed consent, the bronchoscope was                        passed under direct vision. Throughout the procedure,                        the patient's blood pressure, pulse, and oxygen                        saturations were monitored continuously. the BF-H190                        ([id number]) Olympus Diagnostic Bronchoscope was                        introduced through the right nostril and advanced to                        the tracheobronchial tree of both lungs. Scope In: 9:36:31 AM Scope Out: 9:57:04 AM Findings:      The nasopharynx/oropharynx appears normal. The larynx appears normal.       The vocal cords appear normal. The subglottic space is normal. The       trachea is of normal caliber. The carina is sharp. The tracheobronchial       tree was examined to at least the first subsegmental level. Bronchial       mucosa and anatomy are normal; there are no endobronchial lesions, Thin       frothy seceretions were noted in bilateral lung, more in lower lobes      Bronchoalveolar lavage was performed in the RLL posterior basal segment       (B10) of the lung and sent for cell count, bacterial culture, viral       smears & culture, and fungal & AFB analysis and cytology. 180 mL of  fluid were instilled. 80 mL were returned. The return was mucopurulent.       There were no mucoid plugs in the return fluid. Multiple specimens were       obtained and pooled into one specimen, which was sent for analysis.      Fluoroscopy guided transbronchial  brushings of an area of infiltration       were obtained in the posterior basal segment of the right lower lobe       with a cytology brush and sent for routine cytology. Three samples were       obtained. Transbronchial brushing technique was selected because the       sampling site was not visible endoscopically.      Transbronchial biopsies of an area of infiltration were performed in the       posterior basal segment of the right lower lobe using alligator forceps       and sent for histopathology examination. The procedure was guided by       fluoroscopy. Transbronchial biopsy technique was selected because the       sampling site was not accessible using standard endoscopic       (bronchoscopic) techniques. Five biopsy passes were performed. Five       biopsy samples were obtained. Impression:           - Unresolving bilateral infiltrates                       - The airway examination was normal.                       - Bronchoalveolar lavage was performed.                       - Transbronchial brushings were obtained.                       - Transbronchial lung biopsies were performed. Moderate Sedation:      Moderate (conscious) sedation was administered by the endoscopy nurse       and supervised by the endoscopist. The following parameters were       monitored: oxygen saturation, heart rate, blood pressure, respiratory       rate, EKG, adequacy of pulmonary ventilation, and response to care.       Total physician intraservice time was 40 minutes. Recommendation:       - Await BAL, biopsy, brushing, culture and cytology                        results. Procedure Code(s):    --- Professional ---                       [id number], Bronchoscopy, rigid or flexible, including                        fluoroscopic guidance, when performed; with                        transbronchial lung biopsy(s), single lobe                       [id number], Bronchoscopy, rigid or flexible, including  fluoroscopic guidance, when performed; with bronchial                        alveolar lavage                       [phone], Bronchoscopy, rigid or flexible, including                        fluoroscopic guidance, when performed; with brushing or                        protected brushings                       99152, Moderate sedation services provided by the same                        physician or other qualified health care professional                        performing the diagnostic or therapeutic service that                        the sedation supports, requiring the presence of an                        independent trained observer to assist in the                        monitoring of the patient's level of consciousness and                        physiological status; initial 15 minutes of                        intraservice time, patient age 62 years or older                       [phone], Moderate sedation; each additional 15 minutes                        intraservice time                       99153, Moderate sedation; each additional 15 minutes                        intraservice time Diagnosis Code(s):    --- Professional ---                       R91.8, Other nonspecific abnormal finding of lung field CPT copyright 2018 American Medical Association. All rights reserved. The codes documented in this report are preliminary and upon coder review may  be revised to meet current compliance requirements. Marshell Garfinkel, MD 05/22/2018 10:12:09 AM Number of Addenda: 0

## 2018-05-22 NOTE — Progress Notes (Signed)
Video Bronchoscopy done Intervention Bronchial washing done Intervention Bronchial biopsy done Intervention Bronchial brushing done Procedure  Tolerated well

## 2018-05-22 NOTE — Discharge Instructions (Signed)
Flexible Bronchoscopy, Care After This sheet gives you information about how to care for yourself after your test. Your doctor may also give you more specific instructions. If you have problems or questions, contact your doctor. Follow these instructions at home: Eating and drinking  Do not eat or drink anything (not even water) for 2 hours after your test, or until your numbing medicine (local anesthetic) wears off.  When your numbness is gone and your cough and gag reflexes have come back, you may: ? Eat only soft foods. ? Slowly drink liquids.  The day after the test, go back to your normal diet. Driving  Do not drive for 24 hours if you were given a medicine to help you relax (sedative).  Do not drive or use heavy machinery while taking prescription pain medicine. General instructions   Take over-the-counter and prescription medicines only as told by your doctor.  Return to your normal activities as told. Ask what activities are safe for you.  Do not use any products that have nicotine or tobacco in them. This includes cigarettes and e-cigarettes. If you need help quitting, ask your doctor.  Keep all follow-up visits as told by your doctor. This is important. It is very important if you had a tissue sample (biopsy) taken. Get help right away if:  You have shortness of breath that gets worse.  You get light-headed.  You feel like you are going to pass out (faint).  You have chest pain.  You cough up: ? More than a little blood. ? More blood than before. Summary  Do not eat or drink anything (not even water) for 2 hours after your test, or until your numbing medicine wears off.  Do not use cigarettes. Do not use e-cigarettes.  Get help right away if you have chest pain. This information is not intended to replace advice given to you by your health care provider. Make sure you discuss any questions you have with your health care provider. Document Released: 02/04/2009  Document Revised: 04/27/2016 Document Reviewed: 04/27/2016 Elsevier Interactive Patient Education  2019 [employer]. Nothing to eat or drink until  12:00   noon today 05/22/2018 Any questions or concerns please call the office at [phone]

## 2018-05-23 ENCOUNTER — Encounter (HOSPITAL_COMMUNITY): Payer: Self-pay | Admitting: Pulmonary Disease

## 2018-05-23 LAB — ACID FAST SMEAR (AFB, MYCOBACTERIA): Acid Fast Smear: NEGATIVE

## 2018-05-24 LAB — CULTURE, BAL-QUANTITATIVE W GRAM STAIN
Culture: 30000 — AB
Special Requests: NORMAL

## 2018-05-26 ENCOUNTER — Telehealth: Payer: Self-pay | Admitting: Pulmonary Disease

## 2018-05-26 LAB — PNEUMOCYSTIS JIROVECI SMEAR BY DFA: Pneumocystis jiroveci Ag: NEGATIVE

## 2018-05-26 NOTE — Telephone Encounter (Signed)
Called and spoke to pt. Pt is requesting bronch results piror to 06/03/18 office visit.  Pt also states he has not heard from [facility] regarding oxygen tanks. Per our records, order was placed on 05/20/18 to [facility] for tanks.  PCC's can you guys help with this.  Dr. Vaughan Browner please advise on results. Thanks.

## 2018-05-26 NOTE — Telephone Encounter (Signed)
Bronchoscopy showed some bacteria consistent with recurrent aspiration.  There is no evidence of malignancy on the specimens obtained.  Finish antibiotics as prescribed. Encourage him to use aspiration precautions including thickened liquids as recommended by speech therapy We will discuss further in detail at return visit.

## 2018-05-26 NOTE — Telephone Encounter (Signed)
Pt is aware of below results and voiced his understanding. Will await response from [facility] regarding tanks.

## 2018-05-26 NOTE — Telephone Encounter (Signed)
Pt is aware that order will need to be sent to another DME company. Pt has pending OV with Dr. Vaughan Browner on 06/03/18.pt will be qualified for POC at this appointment. Per our records, pt has pending OV with [facility] on 06/23/18 not today.  Nothing further is needed.

## 2018-05-26 NOTE — Telephone Encounter (Signed)
Levada Dy called me back regarding status of O2 referral.  She states they did get order from [place] on 1/28 but then she called in on 1/28 and made [place] aware they were not able to provide what pt wants.  Per note on 1/28 pt was scheduled for appt with [facility] to be walked & requalified -   [facility] is aware of the appt today Pt is coming in to be qualified  On POC Nothing further needed.  Another O2 order was not placed for another dme.

## 2018-05-26 NOTE — Telephone Encounter (Signed)
Order was sent to [facility] on 1/28 & per note on referral confirmation was received from [facility] on 1/28.  Called [facility] & left vm for Levada Dy to call me back regarding status of order.

## 2018-06-02 ENCOUNTER — Encounter (HOSPITAL_COMMUNITY): Payer: BLUE CROSS/BLUE SHIELD

## 2018-06-02 ENCOUNTER — Ambulatory Visit (HOSPITAL_COMMUNITY): Payer: BLUE CROSS/BLUE SHIELD

## 2018-06-03 ENCOUNTER — Telehealth: Payer: Self-pay | Admitting: Pulmonary Disease

## 2018-06-03 ENCOUNTER — Encounter: Payer: Self-pay | Admitting: Pulmonary Disease

## 2018-06-03 ENCOUNTER — Ambulatory Visit (INDEPENDENT_AMBULATORY_CARE_PROVIDER_SITE_OTHER): Payer: BLUE CROSS/BLUE SHIELD | Admitting: Pulmonary Disease

## 2018-06-03 VITALS — BP 110/62 | HR 54 | Ht 69.0 in | Wt 118.2 lb

## 2018-06-03 DIAGNOSIS — I2699 Other pulmonary embolism without acute cor pulmonale: Secondary | ICD-10-CM

## 2018-06-03 DIAGNOSIS — R918 Other nonspecific abnormal finding of lung field: Secondary | ICD-10-CM | POA: Diagnosis not present

## 2018-06-03 DIAGNOSIS — J449 Chronic obstructive pulmonary disease, unspecified: Secondary | ICD-10-CM

## 2018-06-03 DIAGNOSIS — R0609 Other forms of dyspnea: Secondary | ICD-10-CM

## 2018-06-03 DIAGNOSIS — T17908A Unspecified foreign body in respiratory tract, part unspecified causing other injury, initial encounter: Secondary | ICD-10-CM

## 2018-06-03 DIAGNOSIS — R0600 Dyspnea, unspecified: Secondary | ICD-10-CM

## 2018-06-03 DIAGNOSIS — J9601 Acute respiratory failure with hypoxia: Secondary | ICD-10-CM | POA: Diagnosis not present

## 2018-06-03 NOTE — Progress Notes (Addendum)
Caleb Harper    [id number]    Dec 21, 1960  Primary Care Physician:Mitchell, L.Marlou Sa, MD  Referring Physician: [name], [name], [place] [employer] [place], [place] [address]  Chief complaint: Follow-up after recent hospitalization for PE, pneumonia.  HPI: 58 year old smoker with history of chronic systolic heart failure [EF 35-40%], emphysema.  Admitted in late November for acute respiratory failure in the setting of bilateral pulmonary embolism, lung infiltrates with nodules suspicious for pneumonia.  Also found to have chronic systolic heart failure  Per history he had already completed 3 rounds of antibiotics as an outpatient.  Procalcitonin was not elevated hence he was not given any additional antibiotic therapy He was started on anticoagulation and discharged to be followed up with pulmonary clinic  Currently on Spiriva inhaler.  Reports chronic dyspnea on exertion, cough without mucus production.  Pets: No pets Occupation: Works as an Clinical biochemist Exposures: No known exposures, no known heart, [place] Smoking history: 30-pack-year smoker.  Quit in October 2019 Travel history: No relevant travel history Relevant family history: Father had COPD.  Interim history: Underwent bronchoscopy on 1/30 with findings of bilateral lower lobe secretions, GPC's on BAL although final cultures showed normal respiratory flora.  He was given 2 weeks of Augmentin Also evaluated by speech pathology with findings of aspiration.   Returns to clinic today.  States that his dyspnea and cough is worsened.  Still gets very short of breath on exertion Continues on oxygen at 3 L which had to be titrated up to 5 L for desats.  Outpatient Encounter Medications as of 06/03/2018  Medication Sig  . albuterol (PROVENTIL HFA;VENTOLIN HFA) 108 (90 Base) MCG/ACT inhaler Inhale 2 puffs into the lungs every 4 (four) hours as needed for wheezing or shortness of breath.  .  Fluticasone-Umeclidin-Vilant (TRELEGY ELLIPTA) 100-62.5-25 MCG/INH AEPB Inhale 1 puff into the lungs daily.  Marland Kitchen ipratropium-albuterol (DUONEB) 0.5-2.5 (3) MG/3ML SOLN Take 3 mLs by nebulization every 6 (six) hours as needed. (Patient taking differently: Take 3 mLs by nebulization every 6 (six) hours as needed (for shortness of breath or wheezing). )  . metoprolol succinate (TOPROL-XL) 25 MG 24 hr tablet Take 1 tablet (25 mg total) by mouth daily.  Marland Kitchen oxyCODONE-acetaminophen (PERCOCET/ROXICET) 5-325 MG tablet Take 1-2 tablets by mouth every 4 (four) hours as needed for moderate pain (Max of 5 tablets per day).   . pantoprazole (PROTONIX) 40 MG tablet Take 1 tablet (40 mg total) by mouth daily.  . rivaroxaban (XARELTO) 20 MG TABS tablet Take 1 tablet (20 mg total) by mouth daily with supper.  . sacubitril-valsartan (ENTRESTO) 49-51 MG Take 1 tablet by mouth 2 (two) times daily.  . [DISCONTINUED] enoxaparin (LOVENOX) 40 MG/0.4ML injection Inject 0.4 mLs (40 mg total) into the skin every 12 (twelve) hours.  . chlorpheniramine-HYDROcodone (TUSSIONEX PENNKINETIC ER) 10-8 MG/5ML SUER Take 5 mLs by mouth every 12 (twelve) hours as needed for cough. (Patient not taking: Reported on 06/03/2018)  . predniSONE (DELTASONE) 20 MG tablet Take 3 tablets (60 mg total) by mouth daily with breakfast. (Patient not taking: Reported on 06/03/2018)   No facility-administered encounter medications on file as of 06/03/2018.     Allergies as of 06/03/2018 - Review Complete 06/03/2018  Allergen Reaction Noted  . Chantix [varenicline tartrate] Nausea And Vomiting 05/13/2018    Past Medical History:  Diagnosis Date  . Chronic back pain   . Emphysema lung ([place])   . Tobacco abuse  Past Surgical History:  Procedure Laterality Date  . BACK SURGERY    . VIDEO BRONCHOSCOPY Bilateral 05/22/2018   Procedure: VIDEO BRONCHOSCOPY WITH FLUORO;  Surgeon: Marshell Garfinkel, MD;  Location: [facility];  Service: Cardiopulmonary;   Laterality: Bilateral;    Family History  Problem Relation Age of Onset  . Cancer Mother   . COPD Father     Social History   Socioeconomic History  . Marital status: Single    Spouse name: Not on file  . Number of children: Not on file  . Years of education: Not on file  . Highest education level: Not on file  Occupational History  . Not on file  Social Needs  . Financial resource strain: Not on file  . Food insecurity:    Worry: Not on file    Inability: Not on file  . Transportation needs:    Medical: Not on file    Non-medical: Not on file  Tobacco Use  . Smoking status: Former Smoker    Types: Cigarettes    Last attempt to quit: 02/12/2018    Years since quitting: 0.3  . Smokeless tobacco: Never Used  Substance and Sexual Activity  . Alcohol use: Yes  . Drug use: No  . Sexual activity: Not on file  Lifestyle  . Physical activity:    Days per week: Not on file    Minutes per session: Not on file  . Stress: Not on file  Relationships  . Social connections:    Talks on phone: Not on file    Gets together: Not on file    Attends religious service: Not on file    Active member of club or organization: Not on file    Attends meetings of clubs or organizations: Not on file    Relationship status: Not on file  . Intimate partner violence:    Fear of current or ex partner: Not on file    Emotionally abused: Not on file    Physically abused: Not on file    Forced sexual activity: Not on file  Other Topics Concern  . Not on file  Social History Narrative  . Not on file   Review of systems: Review of Systems  Constitutional: Negative for fever and chills.  HENT: Negative.   Eyes: Negative for blurred vision.  Respiratory: as per HPI  Cardiovascular: Negative for chest pain and palpitations.  Gastrointestinal: Negative for vomiting, diarrhea, blood per rectum. Genitourinary: Negative for dysuria, urgency, frequency and hematuria.  Musculoskeletal: Negative  for myalgias, back pain and joint pain.  Skin: Negative for itching and rash.  Neurological: Negative for dizziness, tremors, focal weakness, seizures and loss of consciousness.  Endo/Heme/Allergies: Negative for environmental allergies.  Psychiatric/Behavioral: Negative for depression, suicidal ideas and hallucinations.  All other systems reviewed and are negative.  Physical Exam: Blood pressure 110/62, pulse (!) 54, height _0  (1.753 m), weight 118 lb 3.2 oz (53.6 kg), SpO2 90 %. Gen:      No acute distress HEENT:  EOMI, sclera anicteric Neck:     No masses; no thyromegaly Lungs:    Bibasilar crackles CV:         Regular rate and rhythm; no murmurs Abd:      + bowel sounds; soft, non-tender; no palpable masses, no distension Ext:    Mild clubbing, no edema. Skin:      Warm and dry; no rash Neuro: alert and oriented x 3 Psych: normal mood and affect  Data  Reviewed: Imaging: CT chest 03/18/2018- Bilateral nodular, groundglass opacities predominantly in the right lower lobe. Based pulmonary embolism of the right lower lobe with recannulization compatible with subacute to early chronic DVT with associated pulmonary infarction.  Esophageal thickening, aortic atherosclerosis.  PET scan 04/08/2018- extensive hypermetabolic bilateral lower lobe airspace disease likely infectious or inflammatory.  Malignancy is considered less likely.  CT scan 05/12/2018- assessment bilateral multilobular consolidation with groundglass opacities, tree-in-bud with dependent distribution.  Mild progression since last scan.  Severe emphysema. I reviewed the images personally.  PFTs: 04/02/2018 FVC 3.23 [70%), FEV1 2.08 (59%), F/F 64, TLC 34% Moderate obstruction, severe diffusion defect.  Labs: CBC 03/19/2018-WBC 15, eos 0%  Bronchoscopy 05/22/2018 Micorbiology-GPC seen on BAL, 30K colonies normal respiratory flora BAL-WBC 190, 11% lymphs, 0% eos, 61% neutrophils, 28% monocyte macrophage Transbronchial  biopsy-benign BAL, brushing, mildly atypical cells.  Assessment:  Abnormal CT with bilateral nodular infiltrates Bronchoscopy with no evidence of malignancy.  Mild cellular atypia noted Suspect he has ongoing aspiration given findings on swallow eval. Refer to speech and language pathology for training on aspiration precautions.  He has received multiple rounds of antibiotic including 2 weeks of Augmentin which he just completed. Currently on prednisone at 60 mg with no relief in symptoms. Reduce prednisone to 40 mg daily.   His most recent CT shows fibrotic changes at the bases with some bronchiectasis. As he has some basal crackles with mild clubbing he could have idiopathic pulmonary fibrosis. We do not have a clear idea of the etiology of his presentation he may need an surgical lung biopsy We will refer to cardiothoracic surgery.  Moderate COPD Continue Trelegy, duo nebs Supplemental oxygen.  Pulmonary embolism Continue anticoagulation  Plan/Recommendations: Prednisone at 40 mg Referral to SLP Referral to cardiothoracic surgery for consideration of lung biopsy  Marshell Garfinkel MD [facility] 06/03/2018, 1:51 PM  CC: [name], L.Marlou Sa, MD

## 2018-06-03 NOTE — Patient Instructions (Addendum)
We will check your oxygen levels on exertion Refer you to speech-language pathology for aspiration We will also refer you to cardiothoracic surgery for consideration of open lung biopsy Continue Trelegy, nebs, supplemental oxygen Continue Xarelto for pulmonary embolism Continue prednisone at 40 mg/day.  Follow-up in 1 month.

## 2018-06-03 NOTE — Addendum Note (Signed)
Addended by: Karmen Stabs on: 06/03/2018 04:54 PM   Modules accepted: Orders

## 2018-06-03 NOTE — Telephone Encounter (Signed)
I advise the patient he would need to contact medical records to release his info to insurance company.

## 2018-06-06 ENCOUNTER — Other Ambulatory Visit: Payer: Self-pay

## 2018-06-06 ENCOUNTER — Inpatient Hospital Stay (HOSPITAL_COMMUNITY): Payer: BLUE CROSS/BLUE SHIELD

## 2018-06-06 ENCOUNTER — Emergency Department (HOSPITAL_COMMUNITY): Payer: BLUE CROSS/BLUE SHIELD

## 2018-06-06 ENCOUNTER — Inpatient Hospital Stay (HOSPITAL_COMMUNITY)
Admission: EM | Admit: 2018-06-06 | Discharge: 2018-06-22 | DRG: 166 | Disposition: E | Payer: BLUE CROSS/BLUE SHIELD | Attending: Pulmonary Disease | Admitting: Pulmonary Disease

## 2018-06-06 ENCOUNTER — Telehealth: Payer: Self-pay | Admitting: Pulmonary Disease

## 2018-06-06 ENCOUNTER — Encounter (HOSPITAL_COMMUNITY): Payer: Self-pay

## 2018-06-06 DIAGNOSIS — R0602 Shortness of breath: Secondary | ICD-10-CM | POA: Diagnosis not present

## 2018-06-06 DIAGNOSIS — I428 Other cardiomyopathies: Secondary | ICD-10-CM | POA: Diagnosis not present

## 2018-06-06 DIAGNOSIS — R0609 Other forms of dyspnea: Secondary | ICD-10-CM

## 2018-06-06 DIAGNOSIS — J439 Emphysema, unspecified: Secondary | ICD-10-CM | POA: Diagnosis not present

## 2018-06-06 DIAGNOSIS — J431 Panlobular emphysema: Secondary | ICD-10-CM | POA: Diagnosis not present

## 2018-06-06 DIAGNOSIS — J849 Interstitial pulmonary disease, unspecified: Secondary | ICD-10-CM

## 2018-06-06 DIAGNOSIS — Z9981 Dependence on supplemental oxygen: Secondary | ICD-10-CM | POA: Diagnosis not present

## 2018-06-06 DIAGNOSIS — I491 Atrial premature depolarization: Secondary | ICD-10-CM | POA: Diagnosis not present

## 2018-06-06 DIAGNOSIS — Z4682 Encounter for fitting and adjustment of non-vascular catheter: Secondary | ICD-10-CM | POA: Diagnosis not present

## 2018-06-06 DIAGNOSIS — I700 Atherosclerosis of aorta: Secondary | ICD-10-CM | POA: Diagnosis present

## 2018-06-06 DIAGNOSIS — J95811 Postprocedural pneumothorax: Secondary | ICD-10-CM | POA: Diagnosis not present

## 2018-06-06 DIAGNOSIS — J84112 Idiopathic pulmonary fibrosis: Secondary | ICD-10-CM | POA: Diagnosis not present

## 2018-06-06 DIAGNOSIS — C3490 Malignant neoplasm of unspecified part of unspecified bronchus or lung: Secondary | ICD-10-CM

## 2018-06-06 DIAGNOSIS — I499 Cardiac arrhythmia, unspecified: Secondary | ICD-10-CM | POA: Diagnosis not present

## 2018-06-06 DIAGNOSIS — Z66 Do not resuscitate: Secondary | ICD-10-CM | POA: Diagnosis not present

## 2018-06-06 DIAGNOSIS — K5900 Constipation, unspecified: Secondary | ICD-10-CM | POA: Diagnosis not present

## 2018-06-06 DIAGNOSIS — J9601 Acute respiratory failure with hypoxia: Secondary | ICD-10-CM

## 2018-06-06 DIAGNOSIS — I472 Ventricular tachycardia, unspecified: Secondary | ICD-10-CM

## 2018-06-06 DIAGNOSIS — Z515 Encounter for palliative care: Secondary | ICD-10-CM

## 2018-06-06 DIAGNOSIS — G4700 Insomnia, unspecified: Secondary | ICD-10-CM | POA: Diagnosis present

## 2018-06-06 DIAGNOSIS — F419 Anxiety disorder, unspecified: Secondary | ICD-10-CM | POA: Diagnosis present

## 2018-06-06 DIAGNOSIS — Z79899 Other long term (current) drug therapy: Secondary | ICD-10-CM

## 2018-06-06 DIAGNOSIS — J432 Centrilobular emphysema: Secondary | ICD-10-CM | POA: Diagnosis present

## 2018-06-06 DIAGNOSIS — R079 Chest pain, unspecified: Secondary | ICD-10-CM

## 2018-06-06 DIAGNOSIS — Z888 Allergy status to other drugs, medicaments and biological substances status: Secondary | ICD-10-CM

## 2018-06-06 DIAGNOSIS — R0603 Acute respiratory distress: Secondary | ICD-10-CM | POA: Diagnosis not present

## 2018-06-06 DIAGNOSIS — C3432 Malignant neoplasm of lower lobe, left bronchus or lung: Secondary | ICD-10-CM | POA: Diagnosis present

## 2018-06-06 DIAGNOSIS — J479 Bronchiectasis, uncomplicated: Secondary | ICD-10-CM | POA: Diagnosis not present

## 2018-06-06 DIAGNOSIS — Z9689 Presence of other specified functional implants: Secondary | ICD-10-CM

## 2018-06-06 DIAGNOSIS — J9600 Acute respiratory failure, unspecified whether with hypoxia or hypercapnia: Secondary | ICD-10-CM | POA: Diagnosis not present

## 2018-06-06 DIAGNOSIS — R339 Retention of urine, unspecified: Secondary | ICD-10-CM | POA: Diagnosis not present

## 2018-06-06 DIAGNOSIS — I4891 Unspecified atrial fibrillation: Secondary | ICD-10-CM | POA: Diagnosis not present

## 2018-06-06 DIAGNOSIS — I5022 Chronic systolic (congestive) heart failure: Secondary | ICD-10-CM | POA: Diagnosis not present

## 2018-06-06 DIAGNOSIS — C3412 Malignant neoplasm of upper lobe, left bronchus or lung: Secondary | ICD-10-CM | POA: Diagnosis not present

## 2018-06-06 DIAGNOSIS — Z681 Body mass index (BMI) 19 or less, adult: Secondary | ICD-10-CM | POA: Diagnosis not present

## 2018-06-06 DIAGNOSIS — Z7901 Long term (current) use of anticoagulants: Secondary | ICD-10-CM

## 2018-06-06 DIAGNOSIS — Z7189 Other specified counseling: Secondary | ICD-10-CM | POA: Diagnosis not present

## 2018-06-06 DIAGNOSIS — R634 Abnormal weight loss: Secondary | ICD-10-CM | POA: Diagnosis present

## 2018-06-06 DIAGNOSIS — I471 Supraventricular tachycardia: Secondary | ICD-10-CM | POA: Diagnosis present

## 2018-06-06 DIAGNOSIS — J90 Pleural effusion, not elsewhere classified: Secondary | ICD-10-CM | POA: Diagnosis not present

## 2018-06-06 DIAGNOSIS — I5020 Unspecified systolic (congestive) heart failure: Secondary | ICD-10-CM | POA: Diagnosis not present

## 2018-06-06 DIAGNOSIS — R918 Other nonspecific abnormal finding of lung field: Secondary | ICD-10-CM | POA: Diagnosis not present

## 2018-06-06 DIAGNOSIS — M549 Dorsalgia, unspecified: Secondary | ICD-10-CM | POA: Diagnosis present

## 2018-06-06 DIAGNOSIS — Z09 Encounter for follow-up examination after completed treatment for conditions other than malignant neoplasm: Secondary | ICD-10-CM

## 2018-06-06 DIAGNOSIS — Z86010 Personal history of colonic polyps: Secondary | ICD-10-CM

## 2018-06-06 DIAGNOSIS — Z79891 Long term (current) use of opiate analgesic: Secondary | ICD-10-CM

## 2018-06-06 DIAGNOSIS — J449 Chronic obstructive pulmonary disease, unspecified: Secondary | ICD-10-CM | POA: Diagnosis not present

## 2018-06-06 DIAGNOSIS — I2782 Chronic pulmonary embolism: Secondary | ICD-10-CM | POA: Diagnosis not present

## 2018-06-06 DIAGNOSIS — J9621 Acute and chronic respiratory failure with hypoxia: Secondary | ICD-10-CM | POA: Diagnosis present

## 2018-06-06 DIAGNOSIS — G8929 Other chronic pain: Secondary | ICD-10-CM | POA: Diagnosis present

## 2018-06-06 DIAGNOSIS — Z9889 Other specified postprocedural states: Secondary | ICD-10-CM

## 2018-06-06 DIAGNOSIS — I361 Nonrheumatic tricuspid (valve) insufficiency: Secondary | ICD-10-CM | POA: Diagnosis not present

## 2018-06-06 DIAGNOSIS — Z803 Family history of malignant neoplasm of breast: Secondary | ICD-10-CM

## 2018-06-06 DIAGNOSIS — I2699 Other pulmonary embolism without acute cor pulmonale: Secondary | ICD-10-CM | POA: Diagnosis not present

## 2018-06-06 DIAGNOSIS — I493 Ventricular premature depolarization: Secondary | ICD-10-CM | POA: Diagnosis present

## 2018-06-06 DIAGNOSIS — R0600 Dyspnea, unspecified: Secondary | ICD-10-CM | POA: Diagnosis not present

## 2018-06-06 DIAGNOSIS — Z8701 Personal history of pneumonia (recurrent): Secondary | ICD-10-CM

## 2018-06-06 DIAGNOSIS — J9620 Acute and chronic respiratory failure, unspecified whether with hypoxia or hypercapnia: Secondary | ICD-10-CM | POA: Diagnosis not present

## 2018-06-06 DIAGNOSIS — F17211 Nicotine dependence, cigarettes, in remission: Secondary | ICD-10-CM | POA: Diagnosis present

## 2018-06-06 DIAGNOSIS — Z825 Family history of asthma and other chronic lower respiratory diseases: Secondary | ICD-10-CM

## 2018-06-06 DIAGNOSIS — J939 Pneumothorax, unspecified: Secondary | ICD-10-CM | POA: Diagnosis not present

## 2018-06-06 DIAGNOSIS — Z807 Family history of other malignant neoplasms of lymphoid, hematopoietic and related tissues: Secondary | ICD-10-CM

## 2018-06-06 DIAGNOSIS — R05 Cough: Secondary | ICD-10-CM | POA: Diagnosis not present

## 2018-06-06 LAB — LACTIC ACID, PLASMA
Lactic Acid, Venous: 2 mmol/L (ref 0.5–1.9)
Lactic Acid, Venous: 2.5 mmol/L (ref 0.5–1.9)
Lactic Acid, Venous: 1.3 mmol/L (ref 0.5–1.9)

## 2018-06-06 LAB — POCT I-STAT 7, (LYTES, BLD GAS, ICA,H+H)
Bicarbonate: 22.8 mmol/L (ref 20.0–28.0)
Calcium, Ion: 1.15 mmol/L (ref 1.15–1.40)
HEMATOCRIT: 34 % — AB (ref 39.0–52.0)
Hemoglobin: 11.6 g/dL — ABNORMAL LOW (ref 13.0–17.0)
O2 Saturation: 91 %
POTASSIUM: 3.7 mmol/L (ref 3.5–5.1)
Patient temperature: 97.7
Sodium: 138 mmol/L (ref 135–145)
TCO2: 24 mmol/L (ref 22–32)
pCO2 arterial: 31.5 mmHg — ABNORMAL LOW (ref 32.0–48.0)
pH, Arterial: 7.466 — ABNORMAL HIGH (ref 7.350–7.450)
pO2, Arterial: 56 mmHg — ABNORMAL LOW (ref 83.0–108.0)

## 2018-06-06 LAB — CBC WITH DIFFERENTIAL/PLATELET
Abs Immature Granulocytes: 0.07 10*3/uL (ref 0.00–0.07)
Basophils Absolute: 0 10*3/uL (ref 0.0–0.1)
Basophils Relative: 0 %
Eosinophils Absolute: 0.1 10*3/uL (ref 0.0–0.5)
Eosinophils Relative: 1 %
HCT: 36.4 % — ABNORMAL LOW (ref 39.0–52.0)
Hemoglobin: 11.7 g/dL — ABNORMAL LOW (ref 13.0–17.0)
Immature Granulocytes: 1 %
Lymphocytes Relative: 12 %
Lymphs Abs: 1.7 10*3/uL (ref 0.7–4.0)
MCH: 29.8 pg (ref 26.0–34.0)
MCHC: 32.1 g/dL (ref 30.0–36.0)
MCV: 92.9 fL (ref 80.0–100.0)
Monocytes Absolute: 1 10*3/uL (ref 0.1–1.0)
Monocytes Relative: 8 %
Neutro Abs: 10.8 10*3/uL — ABNORMAL HIGH (ref 1.7–7.7)
Neutrophils Relative %: 78 %
Platelets: 257 10*3/uL (ref 150–400)
RBC: 3.92 MIL/uL — ABNORMAL LOW (ref 4.22–5.81)
RDW: 13.1 % (ref 11.5–15.5)
WBC: 13.7 10*3/uL — ABNORMAL HIGH (ref 4.0–10.5)
nRBC: 0 % (ref 0.0–0.2)

## 2018-06-06 LAB — PROTIME-INR
INR: 1.18
Prothrombin Time: 14.9 seconds (ref 11.4–15.2)

## 2018-06-06 LAB — TSH: TSH: 0.954 u[IU]/mL (ref 0.350–4.500)

## 2018-06-06 LAB — GLUCOSE, CAPILLARY
Glucose-Capillary: 103 mg/dL — ABNORMAL HIGH (ref 70–99)
Glucose-Capillary: 122 mg/dL — ABNORMAL HIGH (ref 70–99)

## 2018-06-06 LAB — COMPREHENSIVE METABOLIC PANEL
ALT: 10 U/L (ref 0–44)
AST: 14 U/L — ABNORMAL LOW (ref 15–41)
Albumin: 2.5 g/dL — ABNORMAL LOW (ref 3.5–5.0)
Alkaline Phosphatase: 67 U/L (ref 38–126)
Anion gap: 9 (ref 5–15)
BILIRUBIN TOTAL: 0.8 mg/dL (ref 0.3–1.2)
BUN: 11 mg/dL (ref 6–20)
CO2: 23 mmol/L (ref 22–32)
Calcium: 8.4 mg/dL — ABNORMAL LOW (ref 8.9–10.3)
Chloride: 107 mmol/L (ref 98–111)
Creatinine, Ser: 1 mg/dL (ref 0.61–1.24)
GFR calc Af Amer: >60 mL/min (ref >60)
GFR calc non Af Amer: >60 mL/min (ref >60)
Glucose, Bld: 93 mg/dL (ref 70–99)
Potassium: 3.6 mmol/L (ref 3.5–5.1)
Sodium: 139 mmol/L (ref 135–145)
TOTAL PROTEIN: 5.6 g/dL — AB (ref 6.5–8.1)

## 2018-06-06 LAB — ECHOCARDIOGRAM COMPLETE
Height: 69 in
Weight: 1888 oz

## 2018-06-06 LAB — C-REACTIVE PROTEIN: CRP: 12.2 mg/dL — ABNORMAL HIGH (ref <1.0)

## 2018-06-06 LAB — PROCALCITONIN: Procalcitonin: <0.1 ng/mL

## 2018-06-06 LAB — BRAIN NATRIURETIC PEPTIDE: B Natriuretic Peptide: 227.1 pg/mL — ABNORMAL HIGH (ref 0.0–100.0)

## 2018-06-06 LAB — SEDIMENTATION RATE: Sed Rate: 47 mm/hr — ABNORMAL HIGH (ref 0–16)

## 2018-06-06 LAB — MAGNESIUM: Magnesium: 1.8 mg/dL (ref 1.7–2.4)

## 2018-06-06 LAB — TROPONIN I: Troponin I: <0.03 ng/mL (ref <0.03)

## 2018-06-06 LAB — MRSA PCR SCREENING: MRSA by PCR: NEGATIVE

## 2018-06-06 MED ORDER — HYDROCODONE-ACETAMINOPHEN 5-325 MG PO TABS
1.0000 | ORAL_TABLET | ORAL | Status: DC | PRN
  Administered 2018-06-07 – 2018-06-09 (×3): 2 via ORAL
  Filled 2018-06-06 (×3): qty 2

## 2018-06-06 MED ORDER — ONDANSETRON HCL 4 MG PO TABS: 4.0000 mg | ORAL_TABLET | Freq: Four times a day (QID) | ORAL | Status: DC | PRN

## 2018-06-06 MED ORDER — SODIUM CHLORIDE 0.9 % IV SOLN
1.0000 g | Freq: Three times a day (TID) | INTRAVENOUS | Status: DC
  Administered 2018-06-06 – 2018-06-07 (×2): 1 g via INTRAVENOUS
  Filled 2018-06-06 (×3): qty 1

## 2018-06-06 MED ORDER — VANCOMYCIN HCL IN DEXTROSE 1-5 GM/200ML-% IV SOLN
1000.0000 mg | Freq: Once | INTRAVENOUS | Status: AC
  Administered 2018-06-06: 1000 mg via INTRAVENOUS
  Filled 2018-06-06: qty 200

## 2018-06-06 MED ORDER — SODIUM CHLORIDE 0.9 % IV BOLUS
250.0000 mL | Freq: Once | INTRAVENOUS | Status: AC
  Administered 2018-06-06: 250 mL via INTRAVENOUS

## 2018-06-06 MED ORDER — ACETAMINOPHEN 650 MG RE SUPP: 650.0000 mg | Freq: Four times a day (QID) | RECTAL | Status: DC | PRN

## 2018-06-06 MED ORDER — FLUTICASONE-UMECLIDIN-VILANT 100-62.5-25 MCG/INH IN AEPB: 1.0000 | INHALATION_SPRAY | Freq: Every day | RESPIRATORY_TRACT | Status: DC

## 2018-06-06 MED ORDER — POLYETHYLENE GLYCOL 3350 17 G PO PACK
17.0000 g | PACK | Freq: Every day | ORAL | Status: DC | PRN
  Filled 2018-06-06: qty 1

## 2018-06-06 MED ORDER — PANTOPRAZOLE SODIUM 40 MG PO TBEC
40.0000 mg | DELAYED_RELEASE_TABLET | Freq: Every day | ORAL | Status: DC
  Administered 2018-06-06 – 2018-06-10 (×5): 40 mg via ORAL
  Filled 2018-06-06 (×5): qty 1

## 2018-06-06 MED ORDER — SACUBITRIL-VALSARTAN 49-51 MG PO TABS
1.0000 | ORAL_TABLET | Freq: Two times a day (BID) | ORAL | Status: DC
  Administered 2018-06-06 – 2018-06-10 (×8): 1 via ORAL
  Filled 2018-06-06 (×9): qty 1

## 2018-06-06 MED ORDER — SODIUM CHLORIDE 0.9 % IV SOLN
250.0000 mg | Freq: Four times a day (QID) | INTRAVENOUS | Status: DC
  Administered 2018-06-07 – 2018-06-10 (×13): 250 mg via INTRAVENOUS
  Filled 2018-06-06 (×19): qty 2

## 2018-06-06 MED ORDER — VANCOMYCIN HCL IN DEXTROSE 1-5 GM/200ML-% IV SOLN
1000.0000 mg | INTRAVENOUS | Status: DC
  Administered 2018-06-07: 1000 mg via INTRAVENOUS
  Filled 2018-06-06: qty 200

## 2018-06-06 MED ORDER — METHYLPREDNISOLONE SODIUM SUCC 125 MG IJ SOLR: 60.0000 mg | Freq: Four times a day (QID) | INTRAMUSCULAR | Status: DC

## 2018-06-06 MED ORDER — ACETAMINOPHEN 325 MG PO TABS: 650.0000 mg | ORAL_TABLET | Freq: Four times a day (QID) | ORAL | Status: DC | PRN

## 2018-06-06 MED ORDER — IPRATROPIUM-ALBUTEROL 0.5-2.5 (3) MG/3ML IN SOLN: 3.0000 mL | Freq: Three times a day (TID) | RESPIRATORY_TRACT | Status: DC

## 2018-06-06 MED ORDER — RIVAROXABAN 20 MG PO TABS
20.0000 mg | ORAL_TABLET | Freq: Every day | ORAL | Status: DC
  Administered 2018-06-06: 20 mg via ORAL
  Filled 2018-06-06: qty 1

## 2018-06-06 MED ORDER — ONDANSETRON HCL 4 MG/2ML IJ SOLN
4.0000 mg | Freq: Four times a day (QID) | INTRAMUSCULAR | Status: DC | PRN
  Administered 2018-06-10: 4 mg via INTRAVENOUS

## 2018-06-06 MED ORDER — INSULIN ASPART 100 UNIT/ML ~~LOC~~ SOLN
2.0000 [IU] | SUBCUTANEOUS | Status: DC
  Administered 2018-06-07: 6 [IU] via SUBCUTANEOUS
  Administered 2018-06-07 (×2): 2 [IU] via SUBCUTANEOUS
  Administered 2018-06-07: 3 [IU] via SUBCUTANEOUS
  Administered 2018-06-08 (×4): 2 [IU] via SUBCUTANEOUS

## 2018-06-06 MED ORDER — OXYCODONE-ACETAMINOPHEN 5-325 MG PO TABS
1.0000 | ORAL_TABLET | ORAL | Status: DC | PRN
  Administered 2018-06-07 – 2018-06-10 (×7): 2 via ORAL
  Filled 2018-06-06 (×7): qty 2

## 2018-06-06 MED ORDER — HYDROCOD POLST-CPM POLST ER 10-8 MG/5ML PO SUER
5.0000 mL | Freq: Two times a day (BID) | ORAL | Status: DC | PRN
  Administered 2018-06-07 – 2018-06-09 (×5): 5 mL via ORAL
  Filled 2018-06-06 (×5): qty 5

## 2018-06-06 MED ORDER — PNEUMOCOCCAL VAC POLYVALENT 25 MCG/0.5ML IJ INJ: 0.5000 mL | INJECTION | INTRAMUSCULAR | Status: DC | PRN

## 2018-06-06 MED ORDER — UMECLIDINIUM BROMIDE 62.5 MCG/INH IN AEPB
1.0000 | INHALATION_SPRAY | Freq: Every day | RESPIRATORY_TRACT | Status: DC
  Administered 2018-06-08 – 2018-06-14 (×7): 1 via RESPIRATORY_TRACT
  Filled 2018-06-06 (×2): qty 7

## 2018-06-06 MED ORDER — IPRATROPIUM-ALBUTEROL 0.5-2.5 (3) MG/3ML IN SOLN: 3.0000 mL | Freq: Four times a day (QID) | RESPIRATORY_TRACT | Status: DC | PRN

## 2018-06-06 MED ORDER — IPRATROPIUM-ALBUTEROL 0.5-2.5 (3) MG/3ML IN SOLN
3.0000 mL | Freq: Three times a day (TID) | RESPIRATORY_TRACT | Status: DC
  Administered 2018-06-06 – 2018-06-10 (×11): 3 mL via RESPIRATORY_TRACT
  Filled 2018-06-06 (×9): qty 3

## 2018-06-06 MED ORDER — METOPROLOL SUCCINATE ER 25 MG PO TB24
25.0000 mg | ORAL_TABLET | Freq: Every day | ORAL | Status: DC
  Administered 2018-06-06 – 2018-06-10 (×5): 25 mg via ORAL
  Filled 2018-06-06 (×5): qty 1

## 2018-06-06 MED ORDER — SODIUM CHLORIDE 0.9 % IV SOLN
2.0000 g | Freq: Once | INTRAVENOUS | Status: AC
  Administered 2018-06-06: 2 g via INTRAVENOUS
  Filled 2018-06-06: qty 2

## 2018-06-06 MED ORDER — FLUTICASONE FUROATE-VILANTEROL 100-25 MCG/INH IN AEPB
1.0000 | INHALATION_SPRAY | Freq: Every day | RESPIRATORY_TRACT | Status: DC
  Administered 2018-06-07 – 2018-06-14 (×8): 1 via RESPIRATORY_TRACT
  Filled 2018-06-06 (×2): qty 28

## 2018-06-06 NOTE — ED Provider Notes (Signed)
[facility] EMERGENCY DEPARTMENT Provider Note   CSN: [id number] Arrival date & time: 05/31/2018  1003     History   Chief Complaint Chief Complaint  Patient presents with  . Shortness of Breath  . Tachycardia    HPI Caleb Harper is a 58 y.o. male.  HPI  The patient is a 58 year old male, he is known to have emphysema of his lungs, he is known to have a prior pulmonary embolism which occurred in November of last year, he takes Xarelto daily.  He is also followed by labao or pulmonology.  He reports that over the last several days he has had some increasing amounts of coughing and shortness of breath but this became much worse over the last 6 to 12 hours during which time the patient felt so dyspneic he had to turn his oxygen up to 8 L by nasal cannula at home.  He had never been on oxygen despite his history of COPD until he was diagnosed with a pulmonary embolism in November and since that time has required nasal cannula oxygen chronically.  He denies any prior cardiac history.  Because of the shortness of breath the patient called out for 911 evaluation.  The paramedics found the patient to be tachycardic and what appeared to be a narrow complex regular tachycardia.  After a dose of adenosine the patient had frequent runs of ventricular tachycardia each which resolved spontaneously intermixed with episodes of normal sinus rhythm with frequent bigeminy.  I was instructing them to give amiodarone prehospital which they stated helped his rhythm stabilized, his sinus rhythm slowed and there was no more ventricular ectopy seen.  The patient denies any chest pain, denies any swelling of the legs, states that he has not had fevers but he has had chills.  The symptoms are persistent, they were severe, the patient was critically ill appearing to the paramedics.  Past Medical History:  Diagnosis Date  . Chronic back pain   . Emphysema lung ([place])   . Tobacco abuse      Patient Active Problem List   Diagnosis Date Noted  . Lung consolidation ([place])   . Dyspnea on exertion 05/21/2018  . Anxiety 05/21/2018  . Abnormal CT scan of lung 04/30/2018  . Acute pulmonary embolism ([place]) 03/19/2018    Past Surgical History:  Procedure Laterality Date  . BACK SURGERY    . VIDEO BRONCHOSCOPY Bilateral 05/22/2018   Procedure: VIDEO BRONCHOSCOPY WITH FLUORO;  Surgeon: Marshell Garfinkel, MD;  Location: [facility];  Service: Cardiopulmonary;  Laterality: Bilateral;        Home Medications    Prior to Admission medications   Medication Sig Start Date End Date Taking? Authorizing Provider  albuterol (PROVENTIL HFA;VENTOLIN HFA) 108 (90 Base) MCG/ACT inhaler Inhale 2 puffs into the lungs every 4 (four) hours as needed for wheezing or shortness of breath. 05/19/18  Yes Mannam, Praveen, MD  Fluticasone-Umeclidin-Vilant (TRELEGY ELLIPTA) 100-62.5-25 MCG/INH AEPB Inhale 1 puff into the lungs daily. 05/07/18  Yes Mannam, Praveen, MD  ipratropium-albuterol (DUONEB) 0.5-2.5 (3) MG/3ML SOLN Take 3 mLs by nebulization every 6 (six) hours as needed. Patient taking differently: Take 3 mLs by nebulization every 6 (six) hours as needed (for shortness of breath or wheezing).  05/07/18  Yes Mannam, Praveen, MD  metoprolol succinate (TOPROL-XL) 25 MG 24 hr tablet Take 1 tablet (25 mg total) by mouth daily. 03/23/18  Yes Ghimire, Henreitta Leber, MD  oxyCODONE-acetaminophen (PERCOCET/ROXICET) 5-325 MG tablet Take 1-2 tablets by  mouth every 4 (four) hours as needed for moderate pain (Max of 5 tablets per day).    Yes [provider]  OXYGEN Inhale 6 L into the lungs daily.   Yes [provider]  pantoprazole (PROTONIX) 40 MG tablet Take 1 tablet (40 mg total) by mouth daily. 05/19/18  Yes Mannam, Praveen, MD  rivaroxaban (XARELTO) 20 MG TABS tablet Take 1 tablet (20 mg total) by mouth daily with supper. 05/19/18  Yes Mannam, Praveen, MD  sacubitril-valsartan (ENTRESTO) 49-51 MG  Take 1 tablet by mouth 2 (two) times daily.   Yes [provider]  chlorpheniramine-HYDROcodone (TUSSIONEX PENNKINETIC ER) 10-8 MG/5ML SUER Take 5 mLs by mouth every 12 (twelve) hours as needed for cough. Patient not taking: Reported on 06/03/2018 05/09/18   Lauraine Rinne, NP  predniSONE (DELTASONE) 20 MG tablet Take 3 tablets (60 mg total) by mouth daily with breakfast. 05/19/18   Marshell Garfinkel, MD    Family History Family History  Problem Relation Age of Onset  . Cancer Mother   . COPD Father     Social History Social History   Tobacco Use  . Smoking status: Former Smoker    Types: Cigarettes    Last attempt to quit: 02/12/2018    Years since quitting: 0.3  . Smokeless tobacco: Never Used  Substance Use Topics  . Alcohol use: Yes  . Drug use: No     Allergies   Chantix [varenicline tartrate]   Review of Systems Review of Systems  All other systems reviewed and are negative.    Physical Exam Updated Vital Signs BP 114/82   Pulse 74   Temp 97.7 F (36.5 C) (Oral)   Resp (!) 26   Ht 1.753 m (5\' 9" )   Wt 53.5 kg   SpO2 93%   BMI 17.43 kg/m   Physical Exam Vitals signs and nursing note reviewed.  Constitutional:      General: He is in acute distress.     Appearance: He is well-developed. He is ill-appearing.  HENT:     Head: Normocephalic and atraumatic.     Mouth/Throat:     Pharynx: No oropharyngeal exudate.  Eyes:     General: No scleral icterus.       Right eye: No discharge.        Left eye: No discharge.     Conjunctiva/sclera: Conjunctivae normal.     Pupils: Pupils are equal, round, and reactive to light.  Neck:     Musculoskeletal: Normal range of motion and neck supple.     Thyroid: No thyromegaly.     Vascular: No JVD.  Cardiovascular:     Rate and Rhythm: Normal rate and regular rhythm.     Heart sounds: Normal heart sounds. No murmur. No friction rub. No gallop.      Comments: Occasional ectopic beats Pulmonary:     Effort:  Respiratory distress present.     Breath sounds: Wheezing and rales present.     Comments: The patient has increased work of breathing, he is very tachypneic, he is using some accessory muscles and has diffuse expiratory wheezing.  He has rales at the bases bilaterally right greater than left Abdominal:     General: Bowel sounds are normal. There is no distension.     Palpations: Abdomen is soft. There is no mass.     Tenderness: There is no abdominal tenderness.  Musculoskeletal: Normal range of motion.        General: No  tenderness.  Lymphadenopathy:     Cervical: No cervical adenopathy.  Skin:    General: Skin is warm and dry.     Findings: No erythema or rash.  Neurological:     Mental Status: He is alert.     Coordination: Coordination normal.  Psychiatric:        Behavior: Behavior normal.       ED Treatments / Results  Labs (all labs ordered are listed, but only abnormal results are displayed) Labs Reviewed  LACTIC ACID, PLASMA - Abnormal; Notable for the following components:      Result Value   Lactic Acid, Venous 2.0 (*)    All other components within normal limits  COMPREHENSIVE METABOLIC PANEL - Abnormal; Notable for the following components:   Calcium 8.4 (*)    Total Protein 5.6 (*)    Albumin 2.5 (*)    AST 14 (*)    All other components within normal limits  CBC WITH DIFFERENTIAL/PLATELET - Abnormal; Notable for the following components:   WBC 13.7 (*)    RBC 3.92 (*)    Hemoglobin 11.7 (*)    HCT 36.4 (*)    Neutro Abs 10.8 (*)    All other components within normal limits  BRAIN NATRIURETIC PEPTIDE - Abnormal; Notable for the following components:   B Natriuretic Peptide 227.1 (*)    All other components within normal limits  CULTURE, BLOOD (ROUTINE X 2)  CULTURE, BLOOD (ROUTINE X 2)  RESPIRATORY PANEL BY PCR  TROPONIN I  PROTIME-INR  URINALYSIS, ROUTINE W REFLEX MICROSCOPIC    EKG EKG Interpretation  Date/Time:  Friday June 06 2018  10:09:45 EST Ventricular Rate:  81 PR Interval:    QRS Duration: 100 QT Interval:  363 QTC Calculation: 422 R Axis:   74 Text Interpretation:  Sinus rhythm Multiple ventricular premature complexes Borderline T abnormalities, inferior leads Since last tracing rate slowed, ectopy still seen Confirmed by Noemi Chapel [phone]) on 05/26/2018 10:22:34 AM   Radiology Dg Chest Port 1 View  Result Date: 06/11/2018 CLINICAL DATA:  Cough EXAM: PORTABLE CHEST 1 VIEW COMPARISON:  05/22/2018 FINDINGS: No significant interval change in dense heterogeneous airspace opacity of the left mid and lower lung seen on prior radiographs and CT. Underlying emphysema. No new airspace opacity. IMPRESSION: No significant interval change in dense heterogeneous airspace opacity of the left mid and lower lung seen on prior radiographs and CT. Underlying emphysema. No new airspace opacity. Electronically Signed   By: Eddie Candle M.D.   On: 05/25/2018 10:50    Procedures .Critical Care Performed by: Noemi Chapel, MD Authorized by: Noemi Chapel, MD   Critical care provider statement:    Critical care time (minutes):  35   Critical care time was exclusive of:  Separately billable procedures and treating other patients and teaching time   Critical care was necessary to treat or prevent imminent or life-threatening deterioration of the following conditions:  Respiratory failure   Critical care was time spent personally by me on the following activities:  Blood draw for specimens, development of treatment plan with patient or surrogate, discussions with consultants, evaluation of patient's response to treatment, examination of patient, obtaining history from patient or surrogate, ordering and performing treatments and interventions, ordering and review of laboratory studies, ordering and review of radiographic studies, pulse oximetry, re-evaluation of patient's condition and review of old charts   (including critical care  time)  Medications Ordered in ED Medications  ceFEPIme (MAXIPIME) 1 g in  sodium chloride 0.9 % 100 mL IVPB (has no administration in time range)  vancomycin (VANCOCIN) IVPB 1000 mg/200 mL premix (has no administration in time range)  vancomycin (VANCOCIN) IVPB 1000 mg/200 mL premix (1,000 mg Intravenous New Bag/Given 05/28/2018 1153)  ceFEPIme (MAXIPIME) 2 g in sodium chloride 0.9 % 100 mL IVPB (2 g Intravenous New Bag/Given 06/09/2018 1158)     Initial Impression / Assessment and Plan / ED Course  I have reviewed the triage vital signs and the nursing notes.  Pertinent labs & imaging results that were available during my care of the patient were reviewed by me and considered in my medical decision making (see chart for details).  Clinical Course as of Jun 06 1304  Fri Jun 06, 2018  1122 Due to the patient's increasing coughing persistent density and leukocytosis and antibiotic will be ordered.   [BM]  2951 Lacitc is 2.0 per lab call at 11:37 AM   [BM]  1200 He was seen in consultation regarding arrhythmia   [BM]  1250 D/w Dr. Nelda Marseille - will not admit - reccomends admisison to hospitalist.  D/w Wannetta Sender with cardiology - they will see patient in consultation.   [BM]    Clinical Course User Index [BM] Noemi Chapel, MD   The patient's EKG is reassuring now showing normal sinus rhythm with occasional PVCs, no signs of ischemia though he does have some slight abnormal findings in his T waves.  Chest x-ray will be ordered to rule out pneumothorax pneumonia or other findings.  Labs will be ordered to evaluate for the cause of his symptoms including a BNP to rule out congestive heart failure as well as a troponin.  Labs including a lactic acid and a CBC to make sure he is not septic from pneumonia given his recent cough and chills.  Echocardiogram performed on March 20, 2018 showed an ejection fraction of 35 to 40% with severe global hypokinesis.  The CT scan that was performed approximately 3  weeks ago showed persistent severe multilobar consolidative airspace disease with groundglass opacities and tree-in-bud opacities, there was also findings of fibrosis which was progressive in the lower lobes.  This favored an infectious process.  The patient denies being on an antibiotic at this time.  During the bronchoscopy that was performed on January 30, there were bilateral lower lobe secretions that grew out gram-positive cocci and final culture showed normal respiratory flora, he was given 2 weeks of Augmentin.  He reported several days ago that his dyspnea and coughing had been worsening, he was still very short of breath on exertion, he had continued on his 3 L of oxygen which had been titrated up to 5 L because of desaturations.  Ultimately the patient was referred to cardiothoracic surgery for a lung biopsy as they suspected that his progressive lung disease was related to pulmonary fibrosis and prednisone was continued at 40 mg daily.  Asked with multiple consultants including intensive care unit, cardiology and hospitalist, appreciate of of hospitalist for admission and ongoing care  Final Clinical Impressions(s) / ED Diagnoses   Final diagnoses:  Ventricular tachycardia ([place])  Interstitial lung disease ([place])  Respiratory distress      Noemi Chapel, MD 06/17/2018 1306

## 2018-06-06 NOTE — ED Notes (Signed)
Humidifier taken off o2 because pt was saying it was causing him SOB. Sats noted to be 87%. Sats now 90% on wall o2.

## 2018-06-06 NOTE — ED Notes (Signed)
Pt given urinal. Pt advised he will attempt to use same shortly.

## 2018-06-06 NOTE — Telephone Encounter (Addendum)
Called and spoke to pt.  Pt reports of increased sob with exertion and rest, chills,  prod cough with white mucus and wheezing. Denies fever or sweats. Pt states he has had to increased oxygen from 4L to 8L. Pt checked oxygen levels during our conversation pt's spo2 was at 86% 66HR on 8L. Spo2 recovered to 91% 40HR after 83min of rest with purse lip breathing on 8L.   I have advised pt to check hose for Kinks. Pt stated that he was unable to locate any kinks. Pt stated that his hands were not cold.  I have advised pt to go to ED via EMS based off of symptoms.  Pt stated that he did not have anyone that could to drive him to the ED. I offered to call EMS for pt. Pt requested that I do so, as he is currently by himself.  EMS has been called.  Nothing further is needed.

## 2018-06-06 NOTE — H&P (Signed)
History and Physical    DOA: 06/07/2018  PCP: [name], L.Marlou Sa, MD  Patient coming from: Home  Chief Complaint: Progressively worsening dyspnea and oxygen requirement  HPI: Caleb Harper is a 58 y.o. male with history h/o COPD/emphysema who quit smoking 3 months back and well-known to pulmonary clinic presents with worsening dyspnea and increasing oxygen requirement.  Patient reports being admitted to this [facility] in November for respiratory issues, diagnosed with subacute versus chronic PE/pulmonary infarct and was discharged with home O2 2 L nasal cannula and DOAC.  He states sometime after Christmas he started noticing recurrence of exertional dyspnea which progressively worsened over the last month.  He was evaluated by his primary pulmonologist Dr.Mannam last week and advised to increase O2 to 4 L.  Patient states over the last couple of days he increased O2 to 6 L as he noticed worsening dyspnea with minimal exertion.  He does have a home pulse oximetry and reports intermittent O2 sat drops to 86% on 6 L.  He also reports 15 to 20 pound weight loss since October.  Denies any hemoptysis, hematochezia or melena.  This morning he felt extremely short of breath and summoned EMS.  On their arrival, patient was alert, oriented but noted to be in SVT.  He received 1 dose of adenosine after which he was felt to have wide-complex tachycardia and received 150 mg bolus amiodarone followed by amnio drip and route.   Patient converted to sinus rhythm upon arrival here and amnio drip was discontinued.  Patient evaluated by cardiology who reviewed rhythm strips and felt arrhythmia related to hypoxia with AV node reentrant tachycardia.  They recommended admission with beta-blockers and cardiac monitoring.  Patient was requiring 8 L O2 in the ED to maintain sat greater than 90%.  He has had progressively worsening lung infiltrates on imaging studies done recently.  He also underwent bronchoscopy by pulmonary  with cultures indicating infection with gram-positive cocci.  He completed a two-week course with Augmentin few days back.  He denies any fevers at home.  He was also issued prednisone by primary pulmonologist in the last visit.  He is currently taking 40 mg prednisone daily.  Chest x-ray done today in the ED did not show new infiltrate or significant change from prior imaging although emphysematous changes and dense heterogeneous airspace opacity of the left mid and lower lung were again reported.  Patient evaluated by pulmonary in the ED and high-resolution CT was ordered but patient desaturated to low 80s on 6 L nasal cannula with heart rate going up to 180, hence high resolution images could not be obtained.    Review of Systems: As per HPI otherwise 10 point review of systems negative.    Past Medical History:  Diagnosis Date  . Chronic back pain   . Emphysema lung ([place])   . Tobacco abuse     Past Surgical History:  Procedure Laterality Date  . BACK SURGERY    . VIDEO BRONCHOSCOPY Bilateral 05/22/2018   Procedure: VIDEO BRONCHOSCOPY WITH FLUORO;  Surgeon: Marshell Garfinkel, MD;  Location: [facility];  Service: Cardiopulmonary;  Laterality: Bilateral;    Social history:  reports that he quit smoking about 3 months ago. His smoking use included cigarettes. He has never used smokeless tobacco. He reports current alcohol use. He reports that he does not use drugs.   Allergies  Allergen Reactions  . Chantix [Varenicline Tartrate] Nausea And Vomiting    Family History  Problem Relation Age of  Onset  . Cancer Mother   . COPD Father       Prior to Admission medications   Medication Sig Start Date End Date Taking? Authorizing Provider  albuterol (PROVENTIL HFA;VENTOLIN HFA) 108 (90 Base) MCG/ACT inhaler Inhale 2 puffs into the lungs every 4 (four) hours as needed for wheezing or shortness of breath. 05/19/18  Yes Mannam, Praveen, MD  Fluticasone-Umeclidin-Vilant (TRELEGY ELLIPTA)  100-62.5-25 MCG/INH AEPB Inhale 1 puff into the lungs daily. 05/07/18  Yes Mannam, Praveen, MD  ipratropium-albuterol (DUONEB) 0.5-2.5 (3) MG/3ML SOLN Take 3 mLs by nebulization every 6 (six) hours as needed. Patient taking differently: Take 3 mLs by nebulization every 6 (six) hours as needed (for shortness of breath or wheezing).  05/07/18  Yes Mannam, Praveen, MD  metoprolol succinate (TOPROL-XL) 25 MG 24 hr tablet Take 1 tablet (25 mg total) by mouth daily. 03/23/18  Yes Ghimire, Henreitta Leber, MD  oxyCODONE-acetaminophen (PERCOCET/ROXICET) 5-325 MG tablet Take 1-2 tablets by mouth every 4 (four) hours as needed for moderate pain (Max of 5 tablets per day).    Yes [provider]  OXYGEN Inhale 6 L into the lungs daily.   Yes [provider]  pantoprazole (PROTONIX) 40 MG tablet Take 1 tablet (40 mg total) by mouth daily. 05/19/18  Yes Mannam, Praveen, MD  rivaroxaban (XARELTO) 20 MG TABS tablet Take 1 tablet (20 mg total) by mouth daily with supper. 05/19/18  Yes Mannam, Praveen, MD  sacubitril-valsartan (ENTRESTO) 49-51 MG Take 1 tablet by mouth 2 (two) times daily.   Yes [provider]  chlorpheniramine-HYDROcodone (TUSSIONEX PENNKINETIC ER) 10-8 MG/5ML SUER Take 5 mLs by mouth every 12 (twelve) hours as needed for cough. Patient not taking: Reported on 06/03/2018 05/09/18   Lauraine Rinne, NP  predniSONE (DELTASONE) 20 MG tablet Take 3 tablets (60 mg total) by mouth daily with breakfast. 05/19/18   Marshell Garfinkel, MD    Physical Exam: Vitals:   06/08/2018 1700 05/29/2018 1715 06/01/2018 1730 05/30/2018 1817  BP: (!) 142/88 111/79 112/81 138/78  Pulse: (!) 45 (!) 104 80 (!) 58  Resp:  17    Temp:    98 F (36.7 C)  TempSrc:    Oral  SpO2: (!) 84% 100% 100% 92%  Weight:    52.9 kg  Height:    5' 9"  (1.753 m)    Constitutional: NAD, calm, comfortable Eyes: PERRL, lids and conjunctivae normal ENMT: Mucous membranes are moist. Posterior pharynx clear of any exudate or  lesions.Normal dentition.  Neck: normal, supple, no masses, no thyromegaly Respiratory: Poor air entry but clear to auscultation bilaterally, no wheezing, no crackles. Normal respiratory effort. No accessory muscle use.  Saturating 94% on 100% NRB M placed while in CT Cardiovascular: Regular rate and rhythm, tachycardic, no murmurs / rubs / gallops. No extremity edema. 2+ pedal pulses. No carotid bruits.  Abdomen: no tenderness, no masses palpated. No hepatosplenomegaly. Bowel sounds positive.  Musculoskeletal: no clubbing / cyanosis. No joint deformity upper and lower extremities. Good ROM, no contractures. Normal muscle tone.  Neurologic: CN 2-12 grossly intact. Sensation intact, DTR normal. Strength 5/5 in all 4.  Psychiatric: Normal judgment and insight. Alert and oriented x 3. Normal mood.  SKIN/catheters: Ichthyotic changes on upper back (chronic per patient)  Labs on Admission: I have personally reviewed following labs and imaging studies  CBC: Recent Labs  Lab 06/20/2018 1020 06/07/2018 1428  WBC 13.7*  --   NEUTROABS 10.8*  --   HGB 11.7* 11.6*  HCT 36.4* 34.0*  MCV 92.9  --   PLT 257  --    Basic Metabolic Panel: Recent Labs  Lab 06/14/2018 1020 06/12/2018 1428  NA 139 138  K 3.6 3.7  CL 107  --   CO2 23  --   GLUCOSE 93  --   BUN 11  --   CREATININE 1.00  --   CALCIUM 8.4*  --   MG 1.8  --    GFR: Estimated Creatinine Clearance: 61 mL/min (by C-G formula based on SCr of 1 mg/dL). Liver Function Tests: Recent Labs  Lab 05/26/2018 1020  AST 14*  ALT 10  ALKPHOS 67  BILITOT 0.8  PROT 5.6*  ALBUMIN 2.5*   No results for input(s): LIPASE, AMYLASE in the last 168 hours. No results for input(s): AMMONIA in the last 168 hours. Coagulation Profile: Recent Labs  Lab 06/04/2018 1020  INR 1.18   Cardiac Enzymes: Recent Labs  Lab 06/03/2018 1020  TROPONINI <0.03   BNP (last 3 results) No results for input(s): PROBNP in the last 8760 hours. HbA1C: No results for  input(s): HGBA1C in the last 72 hours. CBG: No results for input(s): GLUCAP in the last 168 hours. Lipid Profile: No results for input(s): CHOL, HDL, LDLCALC, TRIG, CHOLHDL, LDLDIRECT in the last 72 hours. Thyroid Function Tests: No results for input(s): TSH, T4TOTAL, FREET4, T3FREE, THYROIDAB in the last 72 hours. Anemia Panel: No results for input(s): VITAMINB12, FOLATE, FERRITIN, TIBC, IRON, RETICCTPCT in the last 72 hours. Urine analysis: No results found for: COLORURINE, APPEARANCEUR, LABSPEC, PHURINE, GLUCOSEU, HGBUR, BILIRUBINUR, KETONESUR, PROTEINUR, UROBILINOGEN, NITRITE, LEUKOCYTESUR  Radiological Exams on Admission: Dg Chest Port 1 View  Result Date: 06/09/2018 CLINICAL DATA:  Cough EXAM: PORTABLE CHEST 1 VIEW COMPARISON:  05/22/2018 FINDINGS: No significant interval change in dense heterogeneous airspace opacity of the left mid and lower lung seen on prior radiographs and CT. Underlying emphysema. No new airspace opacity. IMPRESSION: No significant interval change in dense heterogeneous airspace opacity of the left mid and lower lung seen on prior radiographs and CT. Underlying emphysema. No new airspace opacity. Electronically Signed   By: Eddie Candle M.D.   On: 06/09/2018 10:50    EKG done at 5 PM: Independently reviewed.  Sinus tachycardia with PVCs     Assessment and Plan:   1.  Acute on chronic hypoxic respiratory failure: ABG done on 8 lits O2 showed PO2 of 56 and O2 sat of 91%.  Discussed with pulmonary and initiated on high flow O2.  BNP at 227.  Status post recent bronchoscopy and antibiotic treatment.  Patient started on empiric broad-spectrum antibiotics in the ED.  No definite infiltrate noted on chest x-ray although does have diffuse heterogeneous airspace opacity as described above.  Afebrile.  He does have mild leukocytosis although could be related to prednisone.  Will admit with IV steroids, neb treatments and continue antibiotics for now.  On diuretics for  systolic CHF with EF 94%.  Resume home dose Lasix.  Appreciate pulmonary and cardiology evaluations.  Per pulmonary, patient planned for CT-guided lung biopsy for definitive diagnosis.  He underwent PFTs in December.  Patient also reports weight loss is October.  Will check inflammatory markers, ESR, C-reactive protein, ANA.  2.  PSVT/AV nodal reentrant tachycardia: Secondary to problem #1 likely.  Appreciate cardiology evaluation and follow-up.  Beta-blockers initiated.  Consider changing to calcium channel blockers given problem #1.  3.  Emphysema/COPD: Neb treatments, steroids and antibiotics as described above.  He  quit smoking 3 months back.  4.  Recent history of PE/systolic CHF with EF 35 to 40% on TEE November 2019: Currently compensated.BNP 227.  On Entresto, beta-blockers, diuretics and anticoagulation. Repeat echo today.  5.  Weight loss:?  Secondary to pulmonary process, end-stage COPD.  Last contrasted CT was in November and did not show any evidence of mass.  He reports undergoing colonoscopy 7 years back with findings of a single colon polyp.  Check TSH.  Check inflammatory markers as described in problem #1.  DVT prophylaxis: On anticoagulation  Code Status: Full code  Family Communication: Discussed with patient. Health care proxy would be his brother Precious Gilding called: Pulmonary and cardiology Admission status:  Patient admitted as inpatient as anticipated LOS greater than 2 midnights    Guilford Shi MD [facility] Pager [phone]  If 7PM-7AM, please contact night-coverage [email or web address] Password [facility]  05/26/2018, 7:45 PM

## 2018-06-06 NOTE — ED Notes (Signed)
Pt became very short of breath in CT with increased heart rate.  Dr. Venora Maples made aware, pt placed on NRB, st's feeling better at this time.  Admitting MD made aware

## 2018-06-06 NOTE — ED Triage Notes (Signed)
Pt arrives with ems from home for initial complaint of Shortness of breath. Pt was in SVT at a rate of 210 with runs of v tach. Pt given 150mg  amiodarone en route and 1L fluid. Pt arrives in NSR at rate of 90. Pt wears 2-4L  at home with sats in the 80s upon ems arrival. Pt increased to 6L with oxygen saturation of 91%. Lungs clear, pt a.o upon arrival.

## 2018-06-06 NOTE — Consult Note (Addendum)
NAME:  Caleb Harper, MRN:  [id number], DOB:  04-04-1961, LOS: 0 ADMISSION DATE:  05/24/2018, CONSULTATION DATE:  06/16/2018 REFERRING MD:  Dr. Sabra Heck, CHIEF COMPLAINT:  Shortness of breath   Brief History   Caleb Harper is a 58 y.o. male with PMHx of emphysema and recent PE presenting to ED with worsening dyspnea and increased O2 requirement. Recent history of abnormal CT with bilateral nodular infiltrates with bronchoscopy showing no evidence of malignancy, mild cellular atypia noted.   History of present illness   Caleb Harper is a 58 y.o. male presenting to ED for worsening dyspnea.  Patient states that symptoms all began around October when he was admitted for pneumonia and received 3 rounds of antibiotics.  States that it seems like it never went away.  Had a CT scan in November which revealed a blood clot.  States that since November he has had to use home oxygen.  Was initially discharged on 2 L and then had to be bumped up to 2.5 L by his pulmonologist.  2 weeks ago he noticed he needed 3 L oxygen.  On Tuesday he saw his pulmonologist, Dr. Vaughan Browner, who increased his oxygen to 4 L.  On Thursday he was requiring 6 L and this morning 9 L.  Given progressive dyspnea patient called 911 as he felt uncomfortable with his breathing.  Breathing is worse with exertion but improves when resting.  Denies any cough.  Does have chills but never fevers.  Does report weakness in 20 pound weight loss since October.  Does report some dizziness and lightheadedness.  Denies nausea, vomiting, diarrhea.  On route to emergency department patient seen to have narrow complex tachycardia.  Received a dose of adenosine and then had frequent runs of ventricular tachycardia which resolved spontaneously and also had frequent bigeminy.  After amiodarone no more ventricular ectopy seen. In ED EKG with some abnormal T waves.   Patient had recent echocardiogram in November 2019 showing an EF of 35 to 40% with global hypokinesis.   The CT scan in November showed severe multilobar consolidative airspace disease with groundglass opacities.  There was also some fibrosis in the lower lobes.  This is what was called a pneumonia and treated with antibiotics.  Bronchoscopy on January 30 showed bilateral lower lobe secretions that grew out gram-positive cocci, treated with Augmentin. Was referred to cardiothoracic surgery by Dr. Vaughan Browner for surgical lung biopsy.   Past Medical History  H/o PE (November, on anticoagulation with xarelto) Emphysema Tobacco use  Significant Hospital Events   2/14 - Admitted to [facility], cardiology and CCM consulted   Consults:  PCCM Cardiology   Procedures:  None   Significant Diagnostic Tests:  CXR 2/14: no significant change from previous, underlying emphysema   Micro Data:  RVP (2/14) > pending Blood cx (2/14) > pending   Antimicrobials:  Cefepime (2/14>) Vancomycin (2/14>)  Objective   Blood pressure (!) 108/59, pulse 71, temperature (!) 97.3 F (36.3 C), temperature source Temporal, resp. rate 14, height 5' 9"  (1.753 m), weight 53.5 kg, SpO2 92 %.        Intake/Output Summary (Last 24 hours) at 06/12/2018 1640 Last data filed at 06/05/2018 1253 Gross per 24 hour  Intake 300 ml  Output -  Net 300 ml   Filed Weights   06/19/2018 1012  Weight: 53.5 kg    Examination: General: awake and alert, sitting up in bed, NAD, [place] in place  HENT: moist mucous membranes,  PERRL, EOMI  Lungs: diffuse bibasilar crackles, course breath sounds, rhonchi in apices, no increased WOB, speaking full sentences, no accessory muscle use  Cardiovascular: RRR, no MRG  Abdomen: soft, non tender, non distended, bowel sounds present  Extremities: no edema, non tender, 5/5 muscle strength, normal grip strength  Neuro: no focal deficits, sensation intact GU: not examined   Assessment & Plan:  Dyspnea Seen by Dr. Vaughan Browner [facility]). Recent bronchoscopy without evidence of  malignancy, mild cellulary atypia noted. Can consider possible aspiration pneumonia as patient is noted on office visits to have difficulty swallowing. Although less likely given no fever, cough, or sputum production. Recent CT showing fibrotic changes with some bornchiectasis. Can consider pulmonary fibroris given these findings. Can consider bilateral lower lobe persistent consilidation. Can consider neoplastic etiology given recent weight loss. Cannot rule out infectious etiology, although patient is afebrile without cough or sputum production. Can consider pulmonary HTN given elevated PA pressures on last echo.  -repeat CT high resolution  -ESR, if elevated can consider bilateral lower lobe persistent consolidation -Trend PCT to r/o infectious etiology   -Trend LA  -Follow RVP -Urine strep/legionella -Continue High Flow, can consider bipap if worsening on high flow  -continue prednisone, recent reduction to 55m daily on 2/11  -continue home meds: entresto -albuterol PRN  -SCL 70 -SSA/SSB -ANA -DS DNA -MPO -PR3 -anti GBM   SVT Found by EMS to have HR 190 and went into bigeminy and trigeminy before converting to SR with frequent PVCs. S/p amiodarone.  -cardiology managing  -Xarelto  -metoprolol 272mqd -repeat echo  -TSH and Mag   HFrEF  Recent echo in Nov 2019. LVEF 35-40%. Elevated PA pressures. Euvolemic on exam  -Cardiology following  -metoprolol  -repeat echo   Best practice:  Family Communication: No family at bedside  Disposition: Admit to [facility], CCM consulted   Labs   CBC: Recent Labs  Lab 06/12/2018 1020 06/07/2018 1428  WBC 13.7*  --   NEUTROABS 10.8*  --   HGB 11.7* 11.6*  HCT 36.4* 34.0*  MCV 92.9  --   PLT 257  --     Basic Metabolic Panel: Recent Labs  Lab 05/30/2018 1020 06/03/2018 1428  NA 139 138  K 3.6 3.7  CL 107  --   CO2 23  --   GLUCOSE 93  --   BUN 11  --   CREATININE 1.00  --   CALCIUM 8.4*  --   MG 1.8  --    GFR: Estimated  Creatinine Clearance: 61.7 mL/min (by C-G formula based on SCr of 1 mg/dL). Recent Labs  Lab 06/20/2018 1020  WBC 13.7*  LATICACIDVEN 2.0*    Liver Function Tests: Recent Labs  Lab 06/17/2018 1020  AST 14*  ALT 10  ALKPHOS 67  BILITOT 0.8  PROT 5.6*  ALBUMIN 2.5*   No results for input(s): LIPASE, AMYLASE in the last 168 hours. No results for input(s): AMMONIA in the last 168 hours.  ABG    Component Value Date/Time   PHART 7.466 (H) 06/08/2018 1428   PCO2ART 31.5 (L) 06/04/2018 1428   PO2ART 56.0 (L) 05/26/2018 1428   HCO3 22.8 06/05/2018 1428   TCO2 24 06/16/2018 1428   O2SAT 91.0 05/26/2018 1428     Coagulation Profile: Recent Labs  Lab 06/09/2018 1020  INR 1.18    Cardiac Enzymes: Recent Labs  Lab 06/12/2018 1020  TROPONINI <0.03    HbA1C: No results found for: HGBA1C  CBG: No results  for input(s): GLUCAP in the last 168 hours.  Review of Systems:   +dyspnea, chills, dizziness, lighheadedness, weakness, 20lb weight loss -chest pain, cough, sputum production, fevers, nausea, vomiting, diarrhea  Past Medical History  He,  has a past medical history of Chronic back pain, Emphysema lung ([place]), and Tobacco abuse.   Surgical History    Past Surgical History:  Procedure Laterality Date  . BACK SURGERY    . VIDEO BRONCHOSCOPY Bilateral 05/22/2018   Procedure: VIDEO BRONCHOSCOPY WITH FLUORO;  Surgeon: Marshell Garfinkel, MD;  Location: [facility];  Service: Cardiopulmonary;  Laterality: Bilateral;     Social History   reports that he quit smoking about 3 months ago. His smoking use included cigarettes. He has never used smokeless tobacco. He reports current alcohol use. He reports that he does not use drugs.   Family History   His family history includes COPD in his father; Cancer in his mother.   Allergies Allergies  Allergen Reactions  . Chantix [Varenicline Tartrate] Nausea And Vomiting     Home Medications  Prior to Admission medications     Medication Sig Start Date End Date Taking? Authorizing Provider  albuterol (PROVENTIL HFA;VENTOLIN HFA) 108 (90 Base) MCG/ACT inhaler Inhale 2 puffs into the lungs every 4 (four) hours as needed for wheezing or shortness of breath. 05/19/18  Yes Mannam, Praveen, MD  Fluticasone-Umeclidin-Vilant (TRELEGY ELLIPTA) 100-62.5-25 MCG/INH AEPB Inhale 1 puff into the lungs daily. 05/07/18  Yes Mannam, Praveen, MD  ipratropium-albuterol (DUONEB) 0.5-2.5 (3) MG/3ML SOLN Take 3 mLs by nebulization every 6 (six) hours as needed. Patient taking differently: Take 3 mLs by nebulization every 6 (six) hours as needed (for shortness of breath or wheezing).  05/07/18  Yes Mannam, Praveen, MD  metoprolol succinate (TOPROL-XL) 25 MG 24 hr tablet Take 1 tablet (25 mg total) by mouth daily. 03/23/18  Yes Ghimire, Henreitta Leber, MD  oxyCODONE-acetaminophen (PERCOCET/ROXICET) 5-325 MG tablet Take 1-2 tablets by mouth every 4 (four) hours as needed for moderate pain (Max of 5 tablets per day).    Yes [provider]  OXYGEN Inhale 6 L into the lungs daily.   Yes [provider]  pantoprazole (PROTONIX) 40 MG tablet Take 1 tablet (40 mg total) by mouth daily. 05/19/18  Yes Mannam, Praveen, MD  rivaroxaban (XARELTO) 20 MG TABS tablet Take 1 tablet (20 mg total) by mouth daily with supper. 05/19/18  Yes Mannam, Praveen, MD  sacubitril-valsartan (ENTRESTO) 49-51 MG Take 1 tablet by mouth 2 (two) times daily.   Yes [provider]  chlorpheniramine-HYDROcodone (TUSSIONEX PENNKINETIC ER) 10-8 MG/5ML SUER Take 5 mLs by mouth every 12 (twelve) hours as needed for cough. Patient not taking: Reported on 06/03/2018 05/09/18   Lauraine Rinne, NP  predniSONE (DELTASONE) 20 MG tablet Take 3 tablets (60 mg total) by mouth daily with breakfast. 05/19/18   Marshell Garfinkel, MD     Critical care time: 45 min

## 2018-06-06 NOTE — Consult Note (Addendum)
CARDIOLOGY CONSULT NOTE    Patient ID: JERAMI TAMMEN; [id number]; 1960-07-12   Admit date: 06/17/2018 Date of Consult: 05/27/2018  Primary Care Provider: [name], L.Marlou Sa, MD Primary Cardiologist: Dr. Virgina Jock Primary Electrophysiologist:  None   Patient Profile:   NETHANIEL MATTIE is a 58 y.o. male with a hx of COPD, HFrEF, and PE who is being seen today for the evaluation of SVT and NSVT.  History of Present Illness:   Mr. Bernier began to have fevers and shortness of breath back in October 2019. He was subsequently treated three times for pneumonia without resolution of his symptoms. Then a CT of the chest was obtained that illustrated subacute versus chronic pulmonary infarct of both the right and left lower lobes. At that time he was admitted to the hospital and started on anticoagulation. Since then he has been on chronic oxygen, typically 4 L per minute nasal cannula. He has noticed increasing exertional dyspnea and tachycardia. He felt as though this is been a progressive issue since he was diagnosed with a chronic pulmonary embolism. He has had intermittent episodes of worsening shortness of breath requiring him to increase his home oxygen. This morning when he woke up he felt extremely short of breath and had to turn his oxygen up to a liters per minute. He subsequently called his doctor's office and EMS was called.   Denies recent fevers, rhinorrhea, headaches, myalgias, change in cough or sputum production, CP, abdominal pain, diarrhea, dysuria, sick contact, orthopnea, PND, LE edema.   I have reviewed the EMS run sheet. They initially arrived to the patient's house to find him alert and oriented but feeling short of breath. He was hooked to cardiac monitoring and found to be in SVT with a heart rate of approximately 190 bpm. He then went into bigeminy and trigeminy before converting back to sinus rhythm with frequent PVCs. He was given amiodarone 150 mg and subsequently brought to  the emergency department for further evaluation.  Past Medical History:  Diagnosis Date  . Chronic back pain   . Emphysema lung ([place])   . Tobacco abuse    Past Surgical History:  Procedure Laterality Date  . BACK SURGERY    . VIDEO BRONCHOSCOPY Bilateral 05/22/2018   Procedure: VIDEO BRONCHOSCOPY WITH FLUORO;  Surgeon: Marshell Garfinkel, MD;  Location: [facility];  Service: Cardiopulmonary;  Laterality: Bilateral;    Home Medications:  Prior to Admission medications   Medication Sig Start Date End Date Taking? Authorizing Provider  albuterol (PROVENTIL HFA;VENTOLIN HFA) 108 (90 Base) MCG/ACT inhaler Inhale 2 puffs into the lungs every 4 (four) hours as needed for wheezing or shortness of breath. 05/19/18  Yes Mannam, Praveen, MD  Fluticasone-Umeclidin-Vilant (TRELEGY ELLIPTA) 100-62.5-25 MCG/INH AEPB Inhale 1 puff into the lungs daily. 05/07/18  Yes Mannam, Praveen, MD  ipratropium-albuterol (DUONEB) 0.5-2.5 (3) MG/3ML SOLN Take 3 mLs by nebulization every 6 (six) hours as needed. Patient taking differently: Take 3 mLs by nebulization every 6 (six) hours as needed (for shortness of breath or wheezing).  05/07/18  Yes Mannam, Praveen, MD  metoprolol succinate (TOPROL-XL) 25 MG 24 hr tablet Take 1 tablet (25 mg total) by mouth daily. 03/23/18  Yes Ghimire, Henreitta Leber, MD  oxyCODONE-acetaminophen (PERCOCET/ROXICET) 5-325 MG tablet Take 1-2 tablets by mouth every 4 (four) hours as needed for moderate pain (Max of 5 tablets per day).    Yes [provider]  OXYGEN Inhale 6 L into the lungs daily.   Yes [provider]  pantoprazole (PROTONIX) 40 MG tablet Take 1 tablet (40 mg total) by mouth daily. 05/19/18  Yes Mannam, Praveen, MD  rivaroxaban (XARELTO) 20 MG TABS tablet Take 1 tablet (20 mg total) by mouth daily with supper. 05/19/18  Yes Mannam, Praveen, MD  sacubitril-valsartan (ENTRESTO) 49-51 MG Take 1 tablet by mouth 2 (two) times daily.   Yes [provider]    chlorpheniramine-HYDROcodone (TUSSIONEX PENNKINETIC ER) 10-8 MG/5ML SUER Take 5 mLs by mouth every 12 (twelve) hours as needed for cough. Patient not taking: Reported on 06/03/2018 05/09/18   Lauraine Rinne, NP  predniSONE (DELTASONE) 20 MG tablet Take 3 tablets (60 mg total) by mouth daily with breakfast. 05/19/18   Marshell Garfinkel, MD   Inpatient Medications: Scheduled Meds:  Continuous Infusions: . ceFEPime (MAXIPIME) IV    . [START ON 06/07/2018] vancomycin     PRN Meds:  Allergies:    Allergies  Allergen Reactions  . Chantix [Varenicline Tartrate] Nausea And Vomiting   Social History:   Social History   Socioeconomic History  . Marital status: Single    Spouse name: Not on file  . Number of children: Not on file  . Years of education: Not on file  . Highest education level: Not on file  Occupational History  . Not on file  Social Needs  . Financial resource strain: Not on file  . Food insecurity:    Worry: Not on file    Inability: Not on file  . Transportation needs:    Medical: Not on file    Non-medical: Not on file  Tobacco Use  . Smoking status: Former Smoker    Types: Cigarettes    Last attempt to quit: 02/12/2018    Years since quitting: 0.3  . Smokeless tobacco: Never Used  Substance and Sexual Activity  . Alcohol use: Yes  . Drug use: No  . Sexual activity: Not on file  Lifestyle  . Physical activity:    Days per week: Not on file    Minutes per session: Not on file  . Stress: Not on file  Relationships  . Social connections:    Talks on phone: Not on file    Gets together: Not on file    Attends religious service: Not on file    Active member of club or organization: Not on file    Attends meetings of clubs or organizations: Not on file    Relationship status: Not on file  . Intimate partner violence:    Fear of current or ex partner: Not on file    Emotionally abused: Not on file    Physically abused: Not on file    Forced sexual activity:  Not on file  Other Topics Concern  . Not on file  Social History Narrative  . Not on file    Family History:   Family History  Problem Relation Age of Onset  . Cancer Mother   . COPD Father     ROS:  Performed and all others negative.    Physical Exam/Data:   Vitals:   06/08/2018 1215 06/02/2018 1230 06/09/2018 1245 06/07/2018 1300  BP: 111/85 105/75 134/79 104/85  Pulse: 66 67 84 69  Resp: 15 13 15  (!) 27  Temp:      TempSrc:      SpO2: 96% 96% 93% 94%  Weight:      Height:        Intake/Output Summary (Last 24 hours) at 06/17/2018 1342 Last data  filed at 06/09/2018 1253 Gross per 24 hour  Intake 300 ml  Output -  Net 300 ml   Filed Weights   06/04/2018 1012  Weight: 53.5 kg   Body mass index is 17.43 kg/m.   General: Thin, malnourished appearing male in no acute distress HENT: Normocephalic, atraumatic, moist mucus membranes Pulm: Inspiratory and expiratory crackles noted in the bilateral lower lung field with clear breath sounds at the apex bilaterally  CV: Irregular rhythm with regular rate, no murmurs, no rubs  Abdomen: Active bowel sounds, soft, non-distended, no tenderness to palpation  Extremities: Pulses palpable in all extremities, no LE edema  Skin: Warm, dry peeling skin Neuro: Alert and oriented x 3  EKG:  The EKG was personally reviewed and demonstrates: NSR with PVCs  Telemetry:  Telemetry was personally reviewed and demonstrates:  NSR with PVCs  Relevant CV Studies:  TTE 03/20/2018  - Left ventricle: The cavity size was normal. Wall thickness was   normal. Systolic function was moderately reduced. The estimated   ejection fraction was in the range of 35% to 40%. Diffuse   hypokinesis. The study is not technically sufficient to allow   evaluation of LV diastolic function. - Mitral valve: Mildly thickened leaflets . There was trivial   regurgitation. - Left atrium: The atrium was normal in size. - Right ventricle: The cavity size was mildly  dilated. Mildy   reduced systolic function. - Tricuspid valve: There was mild regurgitation. - Pulmonary arteries: PA peak pressure: 39 mm Hg (S). - Inferior vena cava: The vessel was normal in size. The   respirophasic diameter changes were in the normal range (= 50%),   consistent with normal central venous pressure.  Laboratory Data:  Chemistry Recent Labs  Lab 06/07/2018 1020  NA 139  K 3.6  CL 107  CO2 23  GLUCOSE 93  BUN 11  CREATININE 1.00  CALCIUM 8.4*  GFRNONAA >60  GFRAA >60  ANIONGAP 9    Recent Labs  Lab 05/25/2018 1020  PROT 5.6*  ALBUMIN 2.5*  AST 14*  ALT 10  ALKPHOS 67  BILITOT 0.8   Hematology Recent Labs  Lab 06/04/2018 1020  WBC 13.7*  RBC 3.92*  HGB 11.7*  HCT 36.4*  MCV 92.9  MCH 29.8  MCHC 32.1  RDW 13.1  PLT 257   Cardiac Enzymes Recent Labs  Lab 06/18/2018 1020  TROPONINI <0.03   No results for input(s): TROPIPOC in the last 168 hours.   BNP Recent Labs  Lab 06/14/2018 1020  BNP 227.1*    DDimer No results for input(s): DDIMER in the last 168 hours.  Radiology/Studies:  Dg Chest Port 1 View  Result Date: 05/29/2018 CLINICAL DATA:  Cough EXAM: PORTABLE CHEST 1 VIEW COMPARISON:  05/22/2018 FINDINGS: No significant interval change in dense heterogeneous airspace opacity of the left mid and lower lung seen on prior radiographs and CT. Underlying emphysema. No new airspace opacity. IMPRESSION: No significant interval change in dense heterogeneous airspace opacity of the left mid and lower lung seen on prior radiographs and CT. Underlying emphysema. No new airspace opacity. Electronically Signed   By: Eddie Candle M.D.   On: 05/25/2018 10:50   Assessment and Plan:   JAMERION CABELLO is a 58 y.o. male with a hx of COPD, HFrEF, and PE who is being seen today for the evaluation of SVT and NSVT.    SVT  - Patient currently in sinus rhythm with frequent PVCs - Has been loaded with  amiodarone. Would not continue given his extensive pulmonary  parenchymal disease.  - Continue metoprolol to 25 mg QD - Continue Xarelto  - Must echo and 02/2018 with reduced left ventricular ejection fraction and elevated PA pressures. Will repeat to assess for further structural changes. - Check TSH and Mg  HFrEF  - Last left ventricular ejection fraction of 35 to 40% on echo in November 2019.  - Continue metoprolol to 25 mg QD - If BP remains elevated can restart Entresto 49-51 mg QD  - Does not appear volume overload or to be in low cardiac output state. No diuretics at this point  - Repeat echocardiogram   Progressive Exertional Dyspnea  COPD Chronic bilateral PE (R > L) - Almost certainly due to his parenchymal changes 2/2 PE and COPD - Elevated PA pressures on last echo. May have pulmonary HTN making exertional dyspnea worse  - Appear euvolemic so would hold diuretics   Will discuss the case further with Dr. Irish Lack.   For questions or updates, please contact [place] Please consult [email or web address] for contact info under Cardiology/STEMI.   Signed, Ina Homes, MD 06/09/2018 1:42 PM   I have examined the patient and reviewed assessment and plan and discussed with patient.  Agree with above as stated.  Continue beta-blocker.  Could consider increasing dose as needed.  Would like to get him back on his Entresto if his blood pressure increases.  Blood pressure was little bit borderline when I saw him.    His significant lung disease is likely a driver of the arrhythmia.  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  He states he has been having symptoms on and off for a month.  Today was the worst episode.  Consider EP consultation if symptoms worsen and ablation could be considered.  Decreased ejection fraction noted in November.  He was due for repeat echo in March.  At his primary cardiologist office, he had a Lexiscan Cardiolite in November showing no ischemia.  It was thought he had a nonischemic cardiomyopathy.  Will repeat echocardiogram while in the  hospital.  Discussed with Dr. Virgina Jock, who is the patient's regular cardiologist.  He will assume care starting tomorrow.  Larae Grooms

## 2018-06-06 NOTE — Progress Notes (Signed)
Pharmacy Antibiotic Note  Caleb Harper is a 58 y.o. male admitted on 06/01/2018 with pneumonia.  Pharmacy has been consulted for Vancomycin and Cefepime dosing.  Plan: Cefepime 1 gm IV q24hr Vancomycin 1000 mg IV q24hr Monitor renal function, C&S and vanc levels as needed  Height: 5\' 9"  (395.8 cm) Weight: 118 lb (53.5 kg) IBW/kg (Calculated) : 70.7  Temp (24hrs), Avg:97.7 F (36.5 C), Min:97.7 F (36.5 C), Max:97.7 F (36.5 C)  Recent Labs  Lab 06/07/2018 1020  WBC 13.7*  CREATININE 1.00  LATICACIDVEN 2.0*    Estimated Creatinine Clearance: 61.7 mL/min (by C-G formula based on SCr of 1 mg/dL).    Allergies  Allergen Reactions  . Chantix [Varenicline Tartrate] Nausea And Vomiting    Antimicrobials this admission: Vanc 2/14 >>  Cefepime 2/14 >>   Thank you for allowing pharmacy to be a part of this patient's care.  Alanda Slim, PharmD, [facility] Clinical Pharmacist Please see AMION for all Pharmacists' Contact Phone Numbers 05/27/2018, 12:12 PM

## 2018-06-06 NOTE — Progress Notes (Signed)
  Echocardiogram 2D Echocardiogram has been performed.  Caleb Harper 06/07/2018, 4:32 PM

## 2018-06-06 NOTE — ED Notes (Signed)
Resp placing pt on 8lpm [place] and EDP stated target range is 88%

## 2018-06-07 DIAGNOSIS — J9620 Acute and chronic respiratory failure, unspecified whether with hypoxia or hypercapnia: Secondary | ICD-10-CM

## 2018-06-07 DIAGNOSIS — I5020 Unspecified systolic (congestive) heart failure: Secondary | ICD-10-CM

## 2018-06-07 LAB — BASIC METABOLIC PANEL
Anion gap: 11 (ref 5–15)
Anion gap: 12 (ref 5–15)
BUN: 9 mg/dL (ref 6–20)
BUN: 7 mg/dL (ref 6–20)
CO2: 20 mmol/L — ABNORMAL LOW (ref 22–32)
CO2: 23 mmol/L (ref 22–32)
CREATININE: 0.91 mg/dL (ref 0.61–1.24)
Calcium: 8.4 mg/dL — ABNORMAL LOW (ref 8.9–10.3)
Calcium: 8.8 mg/dL — ABNORMAL LOW (ref 8.9–10.3)
Chloride: 107 mmol/L (ref 98–111)
Chloride: 105 mmol/L (ref 98–111)
Creatinine, Ser: 0.88 mg/dL (ref 0.61–1.24)
GFR calc Af Amer: >60 mL/min (ref >60)
GFR calc Af Amer: >60 mL/min (ref >60)
GFR calc non Af Amer: >60 mL/min (ref >60)
GFR calc non Af Amer: >60 mL/min (ref >60)
Glucose, Bld: 109 mg/dL — ABNORMAL HIGH (ref 70–99)
Glucose, Bld: 110 mg/dL — ABNORMAL HIGH (ref 70–99)
POTASSIUM: 3.9 mmol/L (ref 3.5–5.1)
Potassium: 3.5 mmol/L (ref 3.5–5.1)
Sodium: 138 mmol/L (ref 135–145)
Sodium: 140 mmol/L (ref 135–145)

## 2018-06-07 LAB — SJOGRENS SYNDROME-B EXTRACTABLE NUCLEAR ANTIBODY

## 2018-06-07 LAB — MPO/PR-3 (ANCA) ANTIBODIES
ANCA Proteinase 3: <3.5 U/mL (ref 0.0–3.5)
Myeloperoxidase Abs: <9 U/mL (ref 0.0–9.0)

## 2018-06-07 LAB — GLUCOSE, CAPILLARY
GLUCOSE-CAPILLARY: 138 mg/dL — AB (ref 70–99)
Glucose-Capillary: 118 mg/dL — ABNORMAL HIGH (ref 70–99)
Glucose-Capillary: 129 mg/dL — ABNORMAL HIGH (ref 70–99)
Glucose-Capillary: 219 mg/dL — ABNORMAL HIGH (ref 70–99)
Glucose-Capillary: 132 mg/dL — ABNORMAL HIGH (ref 70–99)

## 2018-06-07 LAB — MAGNESIUM: Magnesium: 1.7 mg/dL (ref 1.7–2.4)

## 2018-06-07 LAB — PROCALCITONIN: Procalcitonin: <0.1 ng/mL

## 2018-06-07 LAB — SJOGRENS SYNDROME-A EXTRACTABLE NUCLEAR ANTIBODY: SSA (Ro) (ENA) Antibody, IgG: <0.2 AI (ref 0.0–0.9)

## 2018-06-07 LAB — CBC
HEMATOCRIT: 38.2 % — AB (ref 39.0–52.0)
HEMOGLOBIN: 12.6 g/dL — AB (ref 13.0–17.0)
MCH: 29.3 pg (ref 26.0–34.0)
MCHC: 33 g/dL (ref 30.0–36.0)
MCV: 88.8 fL (ref 80.0–100.0)
Platelets: 280 10*3/uL (ref 150–400)
RBC: 4.3 MIL/uL (ref 4.22–5.81)
RDW: 13.1 % (ref 11.5–15.5)
WBC: 14.1 10*3/uL — ABNORMAL HIGH (ref 4.0–10.5)
nRBC: 0 % (ref 0.0–0.2)

## 2018-06-07 LAB — HEPARIN LEVEL (UNFRACTIONATED): Heparin Unfractionated: >2.2 IU/mL — ABNORMAL HIGH (ref 0.30–0.70)

## 2018-06-07 LAB — APTT: aPTT: 36 seconds (ref 24–36)

## 2018-06-07 LAB — ANTI-DNA ANTIBODY, DOUBLE-STRANDED

## 2018-06-07 LAB — ANTI-SCLERODERMA ANTIBODY: Scleroderma (Scl-70) (ENA) Antibody, IgG: 0.2 AI (ref 0.0–0.9)

## 2018-06-07 LAB — ANA W/REFLEX IF POSITIVE: Anti Nuclear Antibody(ANA): NEGATIVE

## 2018-06-07 MED ORDER — HEPARIN (PORCINE) 25000 UT/250ML-% IV SOLN
1100.0000 [IU]/h | INTRAVENOUS | Status: DC
  Administered 2018-06-07: 800 [IU]/h via INTRAVENOUS
  Administered 2018-06-08: 1100 [IU]/h via INTRAVENOUS
  Administered 2018-06-08: 800 [IU]/h via INTRAVENOUS
  Filled 2018-06-07 (×2): qty 250

## 2018-06-07 MED ORDER — TAMSULOSIN HCL 0.4 MG PO CAPS
0.4000 mg | ORAL_CAPSULE | Freq: Every day | ORAL | Status: DC
  Administered 2018-06-07 – 2018-06-10 (×4): 0.4 mg via ORAL
  Filled 2018-06-07 (×4): qty 1

## 2018-06-07 MED ORDER — MAGNESIUM SULFATE 2 GM/50ML IV SOLN
2.0000 g | Freq: Once | INTRAVENOUS | Status: AC
  Administered 2018-06-07: 2 g via INTRAVENOUS
  Filled 2018-06-07: qty 50

## 2018-06-07 MED ORDER — SODIUM CHLORIDE 0.9 % IV SOLN
2.0000 g | Freq: Three times a day (TID) | INTRAVENOUS | Status: DC
  Administered 2018-06-07: 2 g via INTRAVENOUS
  Filled 2018-06-07 (×2): qty 2

## 2018-06-07 MED ORDER — LIDOCAINE HCL URETHRAL/MUCOSAL 2 % EX GEL
1.0000 "application " | Freq: Once | CUTANEOUS | Status: DC
  Filled 2018-06-07 (×2): qty 5

## 2018-06-07 NOTE — Consult Note (Signed)
NAME:  Caleb Harper, MRN:  [id number], DOB:  1960/10/20, LOS: 1 ADMISSION DATE:  06/13/2018, CONSULTATION DATE:  06/13/2018 REFERRING MD:  Dr. Sabra Heck, CHIEF COMPLAINT:  Shortness of breath   Brief History    Caleb Harper is  -58 year old male whose father died from pulmonary fibrosis and 20 14/2015.  He is a former smoker.  He works as an Clinical biochemist.  He is well-known to our service and sees Dr. Vaughan Browner.  History dates from October 2019 when he started developing insidious onset of shortness of breath that has been progressive since then.  Around Thanksgiving 2019 he was diagnosed with multifocal pulmonary embolism associated with right lower lobe groundglass opacities.  Since then he has been on anticoagulation.  Despite anticoagulation he has had progressive shortness of breath and hypoxemia.  On May 22, 2018 underwent bronchoscopy with lavage that showed neutrophilia with volume of 61% [normal is macrophages greater than 90%] and without lymphocytosis or eosinophilia.  A transbronchial biopsy at this time was nondiagnostic.  It appears at this time he might have been started on prednisone empiric which he finished 1 week ago.  Despite this he said progressive shortness of breath.  But because of continued shortness of breath and persistent infiltrates especially on CT scan of the chest May 12, 2018 with bilateral lower lobe consolidation a surgical lung biopsy was recommended.  He has upcoming appointment Dr. Roxan Hockey June 18, 2018.  However in the interim he has decompensated particularly in the last few weeks and now requiring even 6 L of oxygen at rest.  A few weeks ago he was only on 3 L oxygen.  At no point in time he has had fever or rash or joint issues.  Patient is quite upset that the rate of progression.  He feels some slowness in elucidating a diagnosis.  ILD specific hx -  reports that he quit smoking about 3 months ago. His smoking use included cigarettes. He has never used  smokeless tobacco.   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  Past Medical History  H/o PE (November, on anticoagulation with xarelto) Emphysema Tobacco use  [place] [facility] Events   2/14 - Admitted to [facility] hospitalist service, cardiology and CCM consulted ./  Started high dose IV steroids   Consults:  PCCM Cardiology   Procedures:  None   Significant Diagnostic Tests:  Repeat tests 2/14- echo 65%, Possible VSD but cards doubts clinically 2/14 -SCL 70 -SSA/SSB -ANA -DS DNA -MPO -PR3 -anti GBM  - ESR 42   Micro Data:  RVP (2/14) > pending Blood cx (2/14) > pending   Antimicrobials:  Cefepime (2/14>) Vancomycin (2/14>)   SUBJECTIVE/OVERNIGHT/INTERVAL HX   2/15 -  Feels "tad better" better. Down to 6L HFNC. EF improved in echo at admit . Concerns for VSD in echo but cards does not think so clinically. On IV heparin in case in a few days he needs to have surgical lung biopsy. PCT noral RVP pending  Objective   Blood pressure 110/79, pulse 76, temperature 97.7 F (36.5 C), temperature source Oral, resp. rate 18, height _0  (1.753 m), weight 52.9 kg, SpO2 92 %.        Intake/Output Summary (Last 24 hours) at 06/07/2018 1608 Last data filed at 06/07/2018 0400 Gross per  24 hour  Intake 532 ml  Output 1100 ml  Net -568 ml   Filed Weights   06/01/2018 1012 06/12/2018 1817  Weight: 53.5 kg 52.9 kg   General Appearance:  Looks bette Head:  Normocephalic, without obvious abnormality, atraumatic Eyes:  PERRL - yes, conjunctiva/corneas - clear     Ears:  Normal external ear canals, both ears Nose:  G tube - no Throat:  ETT TUBE - no , OG tube - no Neck:  Supple,  No enlargement/tenderness/nodules Lungs: Crackles posteriroly - half way up Heart:  S1 and S2 normal, no murmur, CVP - no.  Pressors -  no Abdomen:  Soft, no masses, no organomegaly Genitalia / Rectal:  Not done Extremities:  Extremities- intact Skin:  ntact in exposed areas . Neurologic:  Sedation - none -> RASS - +1 . Moves all 4s - yes. CAM-ICU - neg . Orientation - x3+      LABS    PULMONARY Recent Labs  Lab 06/18/2018 1428  PHART 7.466*  PCO2ART 31.5*  PO2ART 56.0*  HCO3 22.8  TCO2 24  O2SAT 91.0    CBC Recent Labs  Lab 06/05/2018 1020 05/28/2018 1428 06/07/18 0302  HGB 11.7* 11.6* 12.6*  HCT 36.4* 34.0* 38.2*  WBC 13.7*  --  14.1*  PLT 257  --  280    COAGULATION Recent Labs  Lab 06/01/2018 1020  INR 1.18    CARDIAC   Recent Labs  Lab 05/27/2018 1020  TROPONINI <0.03   No results for input(s): PROBNP in the last 168 hours.   CHEMISTRY Recent Labs  Lab 06/01/2018 1020 06/03/2018 1428 06/18/2018 2314 06/07/18 0302  NA 139 138 138 140  K 3.6 3.7 3.5 3.9  CL 107  --  107 105  CO2 23  --  20* 23  GLUCOSE 93  --  109* 110*  BUN 11  --  9 7  CREATININE 1.00  --  0.91 0.88  CALCIUM 8.4*  --  8.4* 8.8*  MG 1.8  --  1.7  --    Estimated Creatinine Clearance: 69.3 mL/min (by C-G formula based on SCr of 0.88 mg/dL).   LIVER Recent Labs  Lab 06/14/2018 1020  AST 14*  ALT 10  ALKPHOS 67  BILITOT 0.8  PROT 5.6*  ALBUMIN 2.5*  INR 1.18     INFECTIOUS Recent Labs  Lab 06/08/2018 1020 06/13/2018 1943 06/12/2018 2311 06/07/18 0302  LATICACIDVEN 2.0* 2.5* 1.3  --   PROCALCITON  --  <0.10  --  <0.10     ENDOCRINE CBG (last 3)  Recent Labs    06/07/18 0407 06/07/18 0806 06/07/18 1200  GLUCAP 118* 129* 219*         IMAGING x48h  - image(s) personally visualized  -   highlighted in bold Ct Chest Wo Contrast  Result Date: 06/16/2018 CLINICAL DATA:  Worsening shortness of breath and oxygen requirement. History of emphysema and pulmonary embolism. Recent bronchoscopy showing no malignancy but mild cellular atypia. EXAM: CT CHEST WITHOUT CONTRAST TECHNIQUE: Multidetector CT  imaging of the chest was performed following the standard protocol without IV contrast. COMPARISON:  Radiographs 06/20/2018 and 05/22/2018. CT 05/12/2018 and 03/18/2018. FINDINGS: Cardiovascular: Mild atherosclerosis of the aorta and great vessels again noted. No acute vascular findings on noncontrast imaging. The heart size is normal. There is no pericardial effusion. Mediastinum/Nodes: There are no enlarged mediastinal, hilar or axillary lymph nodes.Hilar assessment is limited by the lack of intravenous contrast, although the hilar contours appear  unchanged. The thyroid gland, trachea and esophagus demonstrate no significant findings. Lungs/Pleura: There are trace dependent pleural effusions bilaterally which are stable. No pneumothorax. Severe centrilobular emphysema again noted. Superimposed bilateral airspace opacities show further worsening. In both lower lobes, there is consolidation with volume loss and bronchiectasis. There is worsening involvement in the right middle and left upper lobes with areas of patchy and bandlike consolidation. No dominant lung mass or endobronchial lesion identified. Upper abdomen: The visualized upper abdomen appears stable without acute findings. Musculoskeletal/Chest wall: There is no chest wall mass or suspicious osseous finding. IMPRESSION: 1. Further worsening of extensive bilateral airspace opacities superimposed on emphysema and lower lobe bronchiectasis. Again, findings are most likely inflammatory in etiology, possibly due to chronic aspiration or organizing pneumonia. 2. No dominant mass or adenopathy. 3. Stable small bilateral pleural effusions. 4. Aortic Atherosclerosis (ICD10-I70.0) and Emphysema (ICD10-J43.9). Electronically Signed   By: Richardean Sale M.D.   On: 06/04/2018 20:42   Dg Chest Port 1 View  Result Date: 05/26/2018 CLINICAL DATA:  Cough EXAM: PORTABLE CHEST 1 VIEW COMPARISON:  05/22/2018 FINDINGS: No significant interval change in dense  heterogeneous airspace opacity of the left mid and lower lung seen on prior radiographs and CT. Underlying emphysema. No new airspace opacity. IMPRESSION: No significant interval change in dense heterogeneous airspace opacity of the left mid and lower lung seen on prior radiographs and CT. Underlying emphysema. No new airspace opacity. Electronically Signed   By: Eddie Candle M.D.   On: 05/31/2018 10:50    er, cough, or sputum production. Recent CT showing fibrotic changes with some bornchiectasis. Can consider pulmonary fibroris given these findings. Can consider bilateral lower lobe persistent consilidation. 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 Patient tiody Disposition: Admit to [facility], CCM consulted      SIGNATURE    Dr. MBrand Males M.D., F.C.C.P,  Pulmonary and Critical Care Medicine Staff Physician, [place] - Interstitial Lung Disease  Program  [place] [id number] Pager: [phone] If no answer or between  15:00h - 7:00h: call [phone] Telephone: [phone]  4:59 PM 06/07/2018

## 2018-06-07 NOTE — Progress Notes (Signed)
  D/with Dr Nils Pyle - to evaluate for surgical lung bioppsy in abasence of improvement with high dose steroids. Explained risks - higher o2 need, hx of PE and hx of sCHF. Dr Nils Pyle will evaluate patient but requested xarelto be changed to IV heparin in case patient gets posted for SLB     SIGNATURE    Dr. Brand Males, M.D., F.C.C.P,  Pulmonary and Critical Care Medicine Staff Physician, [place] Director - Interstitial Lung Disease  Program  Pulmonary [place] at [place], [place], [address]  Pager: [phone], If no answer or between  15:00h - 7:00h: call [phone] Telephone: [phone]  8:57 AM 06/07/2018

## 2018-06-07 NOTE — Progress Notes (Signed)
Patient refused CPAP.

## 2018-06-07 NOTE — Progress Notes (Addendum)
RN paged NP secondary to pt having frequent PVCs and desatting with some of the irregularities. RN stated pt c/o chest pain for only a couple of minutes. NP to bedside. S: States his CP was a 2/10 and only lasted for a minute or so. No nausea, diaphoresis or radiation of pain. Localized to central sternum. Denies hx of MI or heart surgery. On 4L O2 at home and is on 8L HF here.  O: Well appearing male in NAD. Alert and oriented. VSS. Is not desatting while NP at bedside. + PVCs on tele and on 12 lead.  A/P:1.  PVCs-check K and Mg. Continue tele. EKG confirmed findings.  2. CP-fleeting. Do not think this was necessarily associated with PVCs. Doubt ACS. Trop normal on admission.  3. Acute on chronic respiratory failure-appears well at exam. No use of accessory muscles. O2 was increased by pulmonary today. He is satting fine.  4. CHF with EF 30-40%-compensated on exam.  KJKG, NP [facility] Update: Mg 1.7, K normal. Given arrhythmia, will give 2 gms Mg. LA back to normal (was 2.5 and bolus given). Patrica Duel, NP [facility]

## 2018-06-07 NOTE — Progress Notes (Signed)
Subjective:  Continues to have severe shortness of breath on exertion.  On 6 L oxygen.  Denies chest pain  Objective:  Vital Signs in the last 24 hours: Temp:  [97.3 F (36.3 C)-98 F (36.7 C)] 97.7 F (36.5 C) (02/15 1100) Pulse Rate:  [26-104] 59 (02/15 1100) Resp:  [14-30] 18 (02/15 1100) BP: (81-142)/(59-90) 110/79 (02/15 1100) SpO2:  [84 %-100 %] 97 % (02/15 0854) Weight:  [52.9 kg] 52.9 kg (02/14 1817)  Intake/Output from previous day: 02/14 0701 - 02/15 0700 In: 832 [P.O.:80; IV Piggyback:752] Out: 1100 [Urine:1100]  Physical Exam  Constitutional: He is oriented to person, place, and time. He appears well-developed. No distress.  Cachectic  HENT:  Head: Normocephalic and atraumatic.  Eyes: Pupils are equal, round, and reactive to light. Conjunctivae are normal.  Neck: No JVD present.  Cardiovascular: Normal rate, regular rhythm and intact distal pulses.  No murmur heard. Pulmonary/Chest: Effort normal. He has no wheezes. He has rales (Bibasilar coarse rales).  Abdominal: Soft. Bowel sounds are normal. There is no rebound.  Musculoskeletal:        General: No edema.  Lymphadenopathy:    He has no cervical adenopathy.  Neurological: He is alert and oriented to person, place, and time. No cranial nerve deficit.  Skin: Skin is warm and dry.  Psychiatric: He has a normal mood and affect.  Nursing note and vitals reviewed.    Lab Results: BMP Recent Labs    06/05/2018 1020 05/24/2018 1428 05/28/2018 2314 06/07/18 0302  NA 139 138 138 140  K 3.6 3.7 3.5 3.9  CL 107  --  107 105  CO2 23  --  20* 23  GLUCOSE 93  --  109* 110*  BUN 11  --  9 7  CREATININE 1.00  --  0.91 0.88  CALCIUM 8.4*  --  8.4* 8.8*  GFRNONAA >60  --  >60 >60  GFRAA >60  --  >60 >60    CBC Recent Labs  Lab 06/17/2018 1020  06/07/18 0302  WBC 13.7*  --  14.1*  RBC 3.92*  --  4.30  HGB 11.7*   < > 12.6*  HCT 36.4*   < > 38.2*  PLT 257  --  280  MCV 92.9  --  88.8  MCH 29.8  --  29.3   MCHC 32.1  --  33.0  RDW 13.1  --  13.1  LYMPHSABS 1.7  --   --   MONOABS 1.0  --   --   EOSABS 0.1  --   --   BASOSABS 0.0  --   --    < > = values in this interval not displayed.    Cardiac Panel (last 3 results) Recent Labs    03/19/18 0946 06/14/2018 1020  TROPONINI <0.03 <0.03    BNP (last 3 results) Recent Labs    03/19/18 0946 05/29/2018 1020  BNP 190.9* 227.1*    TSH Recent Labs    05/29/2018 1943  TSH 0.954    Lipid Panel  No results found for: CHOL, TRIG, HDL, CHOLHDL, VLDL, LDLCALC, LDLDIRECT   Hepatic Function Panel Recent Labs    03/19/18 0946 06/03/2018 1020  PROT 7.6 5.6*  ALBUMIN 3.5 2.5*  AST 19 14*  ALT 13 10  ALKPHOS 130* 67  BILITOT 0.6 0.8    CT Chest 05/28/2018: 1. Further worsening of extensive bilateral airspace opacities superimposed on emphysema and lower lobe bronchiectasis. Again, findings are most likely inflammatory in  etiology, possibly due to chronic aspiration or organizing pneumonia. 2. No dominant mass or adenopathy. 3. Stable small bilateral pleural effusions. 4. Aortic Atherosclerosis (ICD10-I70.0) and Emphysema (ICD10-J43.9).   Cardiac Studies:  EKG 06/20/2018: Sinus rhythm with frequent PVC's  Telemetry 06/07/2018: Frequent PVC's/ventricular bigeminy, sinus arrhythmia No SVT/VT seen  Lexiscan sestamibi stress test 04/21/2018:  1. The resting electrocardiogram demonstrated normal sinus rhythm and incomplete RBBB. LVH and frequent multifocal PVC, V- couplet. Stress EKG is equivocal for ischemia with continued frequent multifocal PVCs and V- couplets and triplets. Pharmacologic stress protocol. Stress symptoms  included dizziness and chest pressure. The stress test was terminated because of end of protocol. 2. The overall quality of the study is good.  Left ventricular cavity is noted to be normal on the rest and stress studies.  Additionally, the right ventricle is normal.  SPECT images demonstrate homogeneous  tracer distribution throughout the myocardium.  The left ventricular ejection fraction was calculated to be 23% with global hypokinesis.  Findings suggests non-ischemic cardiomyopathy.  3. This is a high risk study, in view of reduced LVEF. Clinical correlation recommended.   Assessment & Recommendations:  58 y/o Caucasian male with 30 PY tobacco abuse, emphysema, nonmassive PE 02/2018, HFrEF on guideline directed medical therapy, now admitted with worsening respiratory failure and tachycardia.  Tachycardia: Episodes of SVT and NSVT on presentation 06/01/2018, now resolved. Recurrent PVC's without any sustained tachycardia since then. Suspect initial arrhythmia was driven by his acute hypoxic respiratory failure.   HFrEF: EF 35-40% in 02/2018, improved to nearly normal on echocardiogram in 05/2018. LV is normal size. No evidence of pulmonary hypertension/ RV failure. Outpatient stress test in 03/2018 was suggestive of nonischemic cardiomyopathy without evidence of ischemia/infarction. There is question of small perimembranous VSD raised on echocardiogram on 06/18/2018. I personally reviewed both echocardiograms-02/2018 and 05/2018. There is abnormal color Doppler flow seen near LVOT. However, this could well be an artifact. His clinical presentation of rapidly progressive respiratory failure, while improving EF, normal size LV, and and absence of pulmonary hypertension do not fit clinical picture of a symptomatic VSD. His CT chest shows consolidation/bronchiectesis process, and not vascular congestion. I have ordered a contrast echocardiogram to look for negative contrast jet of left-to-right VSD shunting. Even if perimembranous VSD is present, I think this will be a red herring and an incidental finding.  With regards to his HFrEF, this has improved on Entresto 49-51 mg bid and metoprolol succinate 25 mg daily. He had been out of Entresto 49-51 mg bid for at least 4 days, but tolerating resumption  well while in the hospital.   Acute on chronic hypoxic respiratory failure: Pulmonology following. Surgical lung biopsy is being discussed for definitive diagnosis. I do not see a cardiac contraindication to proceed with this, Definitive diagnosis may be valuable.  H/o PE:  Xarelto switched to heparin anticipating surgical lung biopsy.     Nigel Mormon, M.D. 06/07/2018, 12:40 PM [facility], [place] Pager: [phone] Office: [phone] If no answer: [phone]

## 2018-06-07 NOTE — Progress Notes (Signed)
PROGRESS NOTE    Caleb Harper  GQQ:[id number] DOB: 1961-03-18 DOA: 06/05/2018 PCP: [name], L.Marlou Sa, MD     Brief Narrative:  Caleb Harper is a 58 y.o. male with history h/o COPD/emphysema who quit smoking 3 months back and well-known to pulmonary clinic presents with worsening dyspnea and increasing oxygen requirement.  Patient reports being admitted to this [facility] in November for respiratory issues, diagnosed with subacute versus chronic PE/pulmonary infarct and was discharged with home O2 2 L nasal cannula and Xarelto.  He states sometime after Christmas he started noticing recurrence of exertional dyspnea which progressively worsened over the last month.  He was evaluated by his primary pulmonologist Dr.Mannam last week and advised to increase O2 to 4 L.  Patient states over the last couple of days he increased O2 to 6 L as he noticed worsening dyspnea with minimal exertion.  He does have a home pulse oximetry and reports intermittent O2 sat drops to 86% on 6 L.  He also reports 15 to 20 pound weight loss since October.  Denies any hemoptysis, hematochezia or melena.  This morning he felt extremely short of breath and summoned EMS.  On their arrival, patient was alert, oriented but noted to be in SVT.  He received 1 dose of adenosine after which he was felt to have wide-complex tachycardia and received 150 mg bolus amiodarone followed by amnio drip and route. Patient converted to sinus rhythm upon arrival here and amnio drip was discontinued.  Patient evaluated by cardiology.   New events last 24 hours / Subjective: States that his breathing has improved since admission.  Assessment & Plan:   Principal Problem:   Acute respiratory failure (HCC) Active Problems:   Interstitial lung disease ([place])   Acute on chronic hypoxic respiratory failure secondary to progressive ILD -Appreciate PCCM, note plan for surgical lung biopsy by cardiothoracic surgery -Continue supplemental  oxygen -Empiric antibiotic therapy, cefepime, vanco  -High-dose IV Solu-Medrol -Respiratory viral panel pending, strep pneumo Ag and legionella Ag pending -Procalcitonin <0.10   PSVT/AV nodal reentrant tachycardia: Secondary to problem #1 likely -Appreciate cardiology consultation -Would not give amiodarone due to his extensive pulmonary history -Started on metoprolol -This morning, patient is in sinus bradycardia with rate in the 50s  Recent history of PE -Hold Xarelto in setting of pursuing surgical lung biopsy, IV heparin ordered   Chronic systolic heart failure -Continue Entresto   DVT prophylaxis: IV heparin Code Status: Full code Family Communication: No family at bedside Disposition Plan: Pending further evaluation and procedure per pulmonary/cardiothoracic team.   Consultants:   PCCM  Cardiology  Procedures:   None  Antimicrobials:  Anti-infectives (From admission, onward)   Start     Dose/Rate Route Frequency Ordered Stop   06/07/18 1400  ceFEPIme (MAXIPIME) 2 g in sodium chloride 0.9 % 100 mL IVPB     2 g 200 mL/hr over 30 Minutes Intravenous Every 8 hours 06/07/18 1033     06/07/18 1300  vancomycin (VANCOCIN) IVPB 1000 mg/200 mL premix     1,000 mg 200 mL/hr over 60 Minutes Intravenous Every 24 hours 06/05/2018 1209     06/05/2018 2200  ceFEPIme (MAXIPIME) 1 g in sodium chloride 0.9 % 100 mL IVPB  Status:  Discontinued     1 g 200 mL/hr over 30 Minutes Intravenous Every 8 hours 06/05/2018 1209 06/07/18 1033   06/01/2018 1130  vancomycin (VANCOCIN) IVPB 1000 mg/200 mL premix     1,000 mg 200 mL/hr over 60  Minutes Intravenous  Once 05/31/2018 1123 05/29/2018 1253   06/12/2018 1130  ceFEPIme (MAXIPIME) 2 g in sodium chloride 0.9 % 100 mL IVPB     2 g 200 mL/hr over 30 Minutes Intravenous  Once 06/16/2018 1123 05/29/2018 1228       Objective: Vitals:   05/31/2018 2300 06/07/18 0300 06/07/18 0807 06/07/18 0854  BP: 113/82 110/71 (!) 81/64   Pulse: 74 64 62 61  Resp:  17 (!) 22  18  Temp: 97.9 F (36.6 C) 97.9 F (36.6 C) 97.7 F (36.5 C)   TempSrc: Oral Oral Oral   SpO2: 97% 96% 97% 97%  Weight:      Height:        Intake/Output Summary (Last 24 hours) at 06/07/2018 1116 Last data filed at 06/07/2018 0400 Gross per 24 hour  Intake 832 ml  Output 1100 ml  Net -268 ml   Filed Weights   06/13/2018 1012 06/19/2018 1817  Weight: 53.5 kg 52.9 kg    Examination:  General exam: Appears calm and comfortable  Respiratory system: Decreased breath sounds on left, on Mulat O2 Cardiovascular system: S1 & S2 heard, regular rhythm rate 50s. No JVD, murmurs, rubs, gallops or clicks. No pedal edema. Gastrointestinal system: Abdomen is nondistended, soft and nontender. No organomegaly or masses felt. Normal bowel sounds heard. Central nervous system: Alert and oriented. No focal neurological deficits. Extremities: Symmetric 5 x 5 power. Skin: No rashes, lesions or ulcers Psychiatry: Judgement and insight appear normal. Mood & affect appropriate.   Data Reviewed: I have personally reviewed following labs and imaging studies  CBC: Recent Labs  Lab 05/30/2018 1020 06/05/2018 1428 06/07/18 0302  WBC 13.7*  --  14.1*  NEUTROABS 10.8*  --   --   HGB 11.7* 11.6* 12.6*  HCT 36.4* 34.0* 38.2*  MCV 92.9  --  88.8  PLT 257  --  025   Basic Metabolic Panel: Recent Labs  Lab 06/09/2018 1020 06/16/2018 1428 06/09/2018 2314 06/07/18 0302  NA 139 138 138 140  K 3.6 3.7 3.5 3.9  CL 107  --  107 105  CO2 23  --  20* 23  GLUCOSE 93  --  109* 110*  BUN 11  --  9 7  CREATININE 1.00  --  0.91 0.88  CALCIUM 8.4*  --  8.4* 8.8*  MG 1.8  --  1.7  --    GFR: Estimated Creatinine Clearance: 69.3 mL/min (by C-G formula based on SCr of 0.88 mg/dL). Liver Function Tests: Recent Labs  Lab 06/01/2018 1020  AST 14*  ALT 10  ALKPHOS 67  BILITOT 0.8  PROT 5.6*  ALBUMIN 2.5*   No results for input(s): LIPASE, AMYLASE in the last 168 hours. No results for input(s): AMMONIA in  the last 168 hours. Coagulation Profile: Recent Labs  Lab 05/28/2018 1020  INR 1.18   Cardiac Enzymes: Recent Labs  Lab 06/02/2018 1020  TROPONINI <0.03   BNP (last 3 results) No results for input(s): PROBNP in the last 8760 hours. HbA1C: No results for input(s): HGBA1C in the last 72 hours. CBG: Recent Labs  Lab 05/28/2018 2124 06/09/2018 2352 06/07/18 0407 06/07/18 0806  GLUCAP 103* 122* 118* 129*   Lipid Profile: No results for input(s): CHOL, HDL, LDLCALC, TRIG, CHOLHDL, LDLDIRECT in the last 72 hours. Thyroid Function Tests: Recent Labs    05/24/2018 1943  TSH 0.954   Anemia Panel: No results for input(s): VITAMINB12, FOLATE, FERRITIN, TIBC, IRON, RETICCTPCT in the last  72 hours. Sepsis Labs: Recent Labs  Lab 05/29/2018 1020 06/13/2018 1943 06/02/2018 2311 06/07/18 0302  PROCALCITON  --  <0.10  --  <0.10  LATICACIDVEN 2.0* 2.5* 1.3  --     Recent Results (from the past 240 hour(s))  Blood Culture (routine x 2)     Status: None (Preliminary result)   Collection Time: 06/03/2018 10:50 AM  Result Value Ref Range Status   Specimen Description BLOOD RIGHT HAND  Final   Special Requests   Final    BOTTLES DRAWN AEROBIC AND ANAEROBIC Blood Culture results may not be optimal due to an inadequate volume of blood received in culture bottles   Culture   Final    NO GROWTH < 24 HOURS Performed at [facility] [address] [place], [place] [address]    Report Status PENDING  Incomplete  Blood Culture (routine x 2)     Status: None (Preliminary result)   Collection Time: 06/03/2018 11:02 AM  Result Value Ref Range Status   Specimen Description BLOOD RIGHT ANTECUBITAL  Final   Special Requests   Final    BOTTLES DRAWN AEROBIC AND ANAEROBIC Blood Culture adequate volume   Culture   Final    NO GROWTH < 24 HOURS Performed at [facility], [place] [address], [place], [place] [address]    Report Status PENDING  Incomplete  MRSA PCR Screening     Status: None    Collection Time: 05/24/2018  6:23 PM  Result Value Ref Range Status   MRSA by PCR NEGATIVE NEGATIVE Final    Comment:        The GeneXpert MRSA Assay (FDA approved for NASAL specimens only), is one component of a comprehensive MRSA colonization surveillance program. It is not intended to diagnose MRSA infection nor to guide or monitor treatment for MRSA infections. Performed at [facility], [place] [address], [place], [place] [address]        Radiology Studies: Ct Chest Wo Contrast  Result Date: 05/31/2018 CLINICAL DATA:  Worsening shortness of breath and oxygen requirement. History of emphysema and pulmonary embolism. Recent bronchoscopy showing no malignancy but mild cellular atypia. EXAM: CT CHEST WITHOUT CONTRAST TECHNIQUE: Multidetector CT imaging of the chest was performed following the standard protocol without IV contrast. COMPARISON:  Radiographs 06/02/2018 and 05/22/2018. CT 05/12/2018 and 03/18/2018. FINDINGS: Cardiovascular: Mild atherosclerosis of the aorta and great vessels again noted. No acute vascular findings on noncontrast imaging. The heart size is normal. There is no pericardial effusion. Mediastinum/Nodes: There are no enlarged mediastinal, hilar or axillary lymph nodes.Hilar assessment is limited by the lack of intravenous contrast, although the hilar contours appear unchanged. The thyroid gland, trachea and esophagus demonstrate no significant findings. Lungs/Pleura: There are trace dependent pleural effusions bilaterally which are stable. No pneumothorax. Severe centrilobular emphysema again noted. Superimposed bilateral airspace opacities show further worsening. In both lower lobes, there is consolidation with volume loss and bronchiectasis. There is worsening involvement in the right middle and left upper lobes with areas of patchy and bandlike consolidation. No dominant lung mass or endobronchial lesion identified. Upper abdomen: The visualized upper abdomen  appears stable without acute findings. Musculoskeletal/Chest wall: There is no chest wall mass or suspicious osseous finding. IMPRESSION: 1. Further worsening of extensive bilateral airspace opacities superimposed on emphysema and lower lobe bronchiectasis. Again, findings are most likely inflammatory in etiology, possibly due to chronic aspiration or organizing pneumonia. 2. No dominant mass or adenopathy. 3. Stable small bilateral  pleural effusions. 4. Aortic Atherosclerosis (ICD10-I70.0) and Emphysema (ICD10-J43.9). Electronically Signed   By: Richardean Sale M.D.   On: 05/30/2018 20:42   Dg Chest Port 1 View  Result Date: 06/05/2018 CLINICAL DATA:  Cough EXAM: PORTABLE CHEST 1 VIEW COMPARISON:  05/22/2018 FINDINGS: No significant interval change in dense heterogeneous airspace opacity of the left mid and lower lung seen on prior radiographs and CT. Underlying emphysema. No new airspace opacity. IMPRESSION: No significant interval change in dense heterogeneous airspace opacity of the left mid and lower lung seen on prior radiographs and CT. Underlying emphysema. No new airspace opacity. Electronically Signed   By: Eddie Candle M.D.   On: 06/11/2018 10:50      Scheduled Meds: . fluticasone furoate-vilanterol  1 puff Inhalation Daily  . insulin aspart  2-6 Units Subcutaneous Q4H  . ipratropium-albuterol  3 mL Nebulization TID  . metoprolol succinate  25 mg Oral Daily  . pantoprazole  40 mg Oral Daily  . sacubitril-valsartan  1 tablet Oral BID  . umeclidinium bromide  1 puff Inhalation Daily   Continuous Infusions: . ceFEPime (MAXIPIME) IV    . heparin    . methylPREDNISolone (SOLU-MEDROL) injection 250 mg (06/07/18 0931)  . vancomycin       LOS: 1 day    Time spent: 40 minutes   Dessa Phi, DO [facility] [email or web address] 06/07/2018, 11:16 AM

## 2018-06-07 NOTE — Progress Notes (Signed)
ANTICOAGULATION CONSULT NOTE - Initial Consult  Pharmacy Consult for heparin Indication: pulmonary embolus  Allergies  Allergen Reactions  . Chantix [Varenicline Tartrate] Nausea And Vomiting    Patient Measurements: Height: 5\' 9"  (175.3 cm) Weight: 116 lb 10 oz (52.9 kg) IBW/kg (Calculated) : 70.7 Heparin Dosing Weight: 53 kg  Vital Signs: Temp: 97.7 F (36.5 C) (02/15 0807) Temp Source: Oral (02/15 0807) BP: 81/64 (02/15 0807) Pulse Rate: 61 (02/15 0854)  Labs: Recent Labs    06/12/2018 1020 06/02/2018 1428 06/09/2018 2314 06/07/18 0302  HGB 11.7* 11.6*  --  12.6*  HCT 36.4* 34.0*  --  38.2*  PLT 257  --   --  280  LABPROT 14.9  --   --   --   INR 1.18  --   --   --   CREATININE 1.00  --  0.91 0.88  TROPONINI <0.03  --   --   --     Estimated Creatinine Clearance: 69.3 mL/min (by C-G formula based on SCr of 0.88 mg/dL).   Medical History: Past Medical History:  Diagnosis Date  . Chronic back pain   . Emphysema lung ([place])   . Tobacco abuse     Medications:  Medications Prior to Admission  Medication Sig Dispense Refill Last Dose  . albuterol (PROVENTIL HFA;VENTOLIN HFA) 108 (90 Base) MCG/ACT inhaler Inhale 2 puffs into the lungs every 4 (four) hours as needed for wheezing or shortness of breath. 1 Inhaler 3 06/01/2018 at prn  . Fluticasone-Umeclidin-Vilant (TRELEGY ELLIPTA) 100-62.5-25 MCG/INH AEPB Inhale 1 puff into the lungs daily. 1 each 3 05/30/2018 at Unknown time  . ipratropium-albuterol (DUONEB) 0.5-2.5 (3) MG/3ML SOLN Take 3 mLs by nebulization every 6 (six) hours as needed. (Patient taking differently: Take 3 mLs by nebulization every 6 (six) hours as needed (for shortness of breath or wheezing). ) 120 mL 3 06/04/2018 at prn  . metoprolol succinate (TOPROL-XL) 25 MG 24 hr tablet Take 1 tablet (25 mg total) by mouth daily. 30 tablet 0 06/05/2018 at 0700  . oxyCODONE-acetaminophen (PERCOCET/ROXICET) 5-325 MG tablet Take 1-2 tablets by mouth every 4 (four) hours  as needed for moderate pain (Max of 5 tablets per day).    05/24/2018 at prn  . OXYGEN Inhale 6 L into the lungs daily.   06/03/2018 at Unknown time  . pantoprazole (PROTONIX) 40 MG tablet Take 1 tablet (40 mg total) by mouth daily. 30 tablet 1 06/05/2018 at Unknown time  . rivaroxaban (XARELTO) 20 MG TABS tablet Take 1 tablet (20 mg total) by mouth daily with supper. 30 tablet 0 06/05/2018 at 1900  . sacubitril-valsartan (ENTRESTO) 49-51 MG Take 1 tablet by mouth 2 (two) times daily.   06/03/2018  . chlorpheniramine-HYDROcodone (TUSSIONEX PENNKINETIC ER) 10-8 MG/5ML SUER Take 5 mLs by mouth every 12 (twelve) hours as needed for cough. (Patient not taking: Reported on 06/03/2018) 140 mL 0 Not Taking  . predniSONE (DELTASONE) 20 MG tablet Take 3 tablets (60 mg total) by mouth daily with breakfast. 30 tablet 0 Not Taking   Scheduled:  . fluticasone furoate-vilanterol  1 puff Inhalation Daily  . insulin aspart  2-6 Units Subcutaneous Q4H  . ipratropium-albuterol  3 mL Nebulization TID  . metoprolol succinate  25 mg Oral Daily  . pantoprazole  40 mg Oral Daily  . sacubitril-valsartan  1 tablet Oral BID  . umeclidinium bromide  1 puff Inhalation Daily   Infusions:  . ceFEPime (MAXIPIME) IV 1 g (06/07/18 0534)  . methylPREDNISolone (  SOLU-MEDROL) injection 250 mg (06/07/18 0931)  . vancomycin      Assessment: Pt is here for his ILD and PNA. He has been on xarelto for his PE. His last dose was last PM at 1900. Plan is to transition to IV heparin due to the need to possible bx. We will get baseline heparin level and PTT for correlation.   Goal of Therapy:  Heparin level 0.3-0.7 units/ml  APTT 66-102 Monitor platelets by anticoagulation protocol: Yes   Plan:  Baseline hep level and PTT Heparin at 800 unit/hr at 1900 this PM Heparin level at 0100 Daily heparin HL and PTT Change cefepime dose to 2g IV q8  Onnie Boer, PharmD, BCIDP, AAHIVP, CPP Infectious Disease Pharmacist 06/07/2018 10:32  AM

## 2018-06-08 ENCOUNTER — Other Ambulatory Visit (HOSPITAL_COMMUNITY): Payer: BLUE CROSS/BLUE SHIELD

## 2018-06-08 ENCOUNTER — Inpatient Hospital Stay (HOSPITAL_COMMUNITY): Payer: BLUE CROSS/BLUE SHIELD

## 2018-06-08 DIAGNOSIS — R0600 Dyspnea, unspecified: Secondary | ICD-10-CM

## 2018-06-08 LAB — URINALYSIS, ROUTINE W REFLEX MICROSCOPIC
Bilirubin Urine: NEGATIVE
Bilirubin Urine: NEGATIVE
Glucose, UA: NEGATIVE mg/dL
Glucose, UA: NEGATIVE mg/dL
Hgb urine dipstick: NEGATIVE
Ketones, ur: NEGATIVE mg/dL
Ketones, ur: NEGATIVE mg/dL
Leukocytes,Ua: NEGATIVE
Leukocytes,Ua: NEGATIVE
Nitrite: NEGATIVE
Nitrite: NEGATIVE
Protein, ur: NEGATIVE mg/dL
Protein, ur: NEGATIVE mg/dL
Specific Gravity, Urine: 1.014 (ref 1.005–1.030)
Specific Gravity, Urine: 1.012 (ref 1.005–1.030)
pH: 6 (ref 5.0–8.0)
pH: 6 (ref 5.0–8.0)

## 2018-06-08 LAB — GLUCOSE, CAPILLARY
GLUCOSE-CAPILLARY: 131 mg/dL — AB (ref 70–99)
Glucose-Capillary: 117 mg/dL — ABNORMAL HIGH (ref 70–99)
Glucose-Capillary: 123 mg/dL — ABNORMAL HIGH (ref 70–99)
Glucose-Capillary: 139 mg/dL — ABNORMAL HIGH (ref 70–99)
Glucose-Capillary: 145 mg/dL — ABNORMAL HIGH (ref 70–99)
Glucose-Capillary: 152 mg/dL — ABNORMAL HIGH (ref 70–99)

## 2018-06-08 LAB — CBC
HEMATOCRIT: 35.6 % — AB (ref 39.0–52.0)
HEMOGLOBIN: 11.6 g/dL — AB (ref 13.0–17.0)
MCH: 29.2 pg (ref 26.0–34.0)
MCHC: 32.6 g/dL (ref 30.0–36.0)
MCV: 89.7 fL (ref 80.0–100.0)
Platelets: 269 10*3/uL (ref 150–400)
RBC: 3.97 MIL/uL — AB (ref 4.22–5.81)
RDW: 13.1 % (ref 11.5–15.5)
WBC: 17.7 10*3/uL — ABNORMAL HIGH (ref 4.0–10.5)
nRBC: 0 % (ref 0.0–0.2)

## 2018-06-08 LAB — ECHOCARDIOGRAM LIMITED
Height: 69 in
Weight: 1865.97 oz

## 2018-06-08 LAB — STREP PNEUMONIAE URINARY ANTIGEN: Strep Pneumo Urinary Antigen: NEGATIVE

## 2018-06-08 LAB — BASIC METABOLIC PANEL
Anion gap: 6 (ref 5–15)
BUN: 15 mg/dL (ref 6–20)
CHLORIDE: 108 mmol/L (ref 98–111)
CO2: 23 mmol/L (ref 22–32)
Calcium: 8.4 mg/dL — ABNORMAL LOW (ref 8.9–10.3)
Creatinine, Ser: 0.9 mg/dL (ref 0.61–1.24)
GFR calc Af Amer: >60 mL/min (ref >60)
GFR calc non Af Amer: >60 mL/min (ref >60)
Glucose, Bld: 141 mg/dL — ABNORMAL HIGH (ref 70–99)
Potassium: 3.7 mmol/L (ref 3.5–5.1)
Sodium: 137 mmol/L (ref 135–145)

## 2018-06-08 LAB — ABO/RH: ABO/RH(D): A NEG

## 2018-06-08 LAB — APTT
APTT: 69 s — AB (ref 24–36)
aPTT: 49 seconds — ABNORMAL HIGH (ref 24–36)
aPTT: 60 seconds — ABNORMAL HIGH (ref 24–36)

## 2018-06-08 LAB — RHEUMATOID FACTOR: Rheumatoid fact SerPl-aCnc: 15.5 IU/mL — ABNORMAL HIGH (ref 0.0–13.9)

## 2018-06-08 LAB — PROCALCITONIN: Procalcitonin: <0.1 ng/mL

## 2018-06-08 MED ORDER — INSULIN ASPART 100 UNIT/ML ~~LOC~~ SOLN
2.0000 [IU] | Freq: Three times a day (TID) | SUBCUTANEOUS | Status: DC
  Administered 2018-06-08 – 2018-06-09 (×3): 4 [IU] via SUBCUTANEOUS
  Administered 2018-06-09 (×2): 2 [IU] via SUBCUTANEOUS

## 2018-06-08 NOTE — Progress Notes (Signed)
  Echocardiogram 2D Echocardiogram limited bubble has been performed.  Darlina Sicilian M 06/08/2018, 2:01 PM

## 2018-06-08 NOTE — Progress Notes (Signed)
[facility] for Heparin (Xarelto on hold) Indication: History of  pulmonary embolus  Allergies  Allergen Reactions  . Chantix [Varenicline Tartrate] Nausea And Vomiting    Patient Measurements: Height: 5\' 9"  (175.3 cm) Weight: 116 lb 10 oz (52.9 kg) IBW/kg (Calculated) : 70.7 Heparin Dosing Weight: 53 kg  Vital Signs: Temp: 97.6 F (36.4 C) (02/15 2300) Temp Source: Oral (02/15 2300) BP: 101/67 (02/15 2300) Pulse Rate: 52 (02/15 2300)  Labs: Recent Labs    06/17/2018 1020 05/26/2018 1428 05/26/2018 2314 06/07/18 0302 06/07/18 1048 06/08/18 0222  HGB 11.7* 11.6*  --  12.6*  --  11.6*  HCT 36.4* 34.0*  --  38.2*  --  35.6*  PLT 257  --   --  280  --  269  APTT  --   --   --   --  36 49*  LABPROT 14.9  --   --   --   --   --   INR 1.18  --   --   --   --   --   HEPARINUNFRC  --   --   --   --  >2.20*  --   CREATININE 1.00  --  0.91 0.88  --  0.90  TROPONINI <0.03  --   --   --   --   --     Estimated Creatinine Clearance: 67.8 mL/min (by C-G formula based on SCr of 0.9 mg/dL).   Medical History: Past Medical History:  Diagnosis Date  . Chronic back pain   . Emphysema lung ([place])   . Tobacco abuse     Medications:  Medications Prior to Admission  Medication Sig Dispense Refill Last Dose  . albuterol (PROVENTIL HFA;VENTOLIN HFA) 108 (90 Base) MCG/ACT inhaler Inhale 2 puffs into the lungs every 4 (four) hours as needed for wheezing or shortness of breath. 1 Inhaler 3 05/26/2018 at prn  . Fluticasone-Umeclidin-Vilant (TRELEGY ELLIPTA) 100-62.5-25 MCG/INH AEPB Inhale 1 puff into the lungs daily. 1 each 3 06/09/2018 at Unknown time  . ipratropium-albuterol (DUONEB) 0.5-2.5 (3) MG/3ML SOLN Take 3 mLs by nebulization every 6 (six) hours as needed. (Patient taking differently: Take 3 mLs by nebulization every 6 (six) hours as needed (for shortness of breath or wheezing). ) 120 mL 3 06/04/2018 at prn  . metoprolol succinate (TOPROL-XL) 25 MG 24  hr tablet Take 1 tablet (25 mg total) by mouth daily. 30 tablet 0 06/05/2018 at 0700  . oxyCODONE-acetaminophen (PERCOCET/ROXICET) 5-325 MG tablet Take 1-2 tablets by mouth every 4 (four) hours as needed for moderate pain (Max of 5 tablets per day).    06/18/2018 at prn  . OXYGEN Inhale 6 L into the lungs daily.   06/17/2018 at Unknown time  . pantoprazole (PROTONIX) 40 MG tablet Take 1 tablet (40 mg total) by mouth daily. 30 tablet 1 06/05/2018 at Unknown time  . rivaroxaban (XARELTO) 20 MG TABS tablet Take 1 tablet (20 mg total) by mouth daily with supper. 30 tablet 0 06/05/2018 at 1900  . sacubitril-valsartan (ENTRESTO) 49-51 MG Take 1 tablet by mouth 2 (two) times daily.   06/03/2018  . chlorpheniramine-HYDROcodone (TUSSIONEX PENNKINETIC ER) 10-8 MG/5ML SUER Take 5 mLs by mouth every 12 (twelve) hours as needed for cough. (Patient not taking: Reported on 06/03/2018) 140 mL 0 Not Taking  . predniSONE (DELTASONE) 20 MG tablet Take 3 tablets (60 mg total) by mouth daily with breakfast. 30 tablet 0 Not Taking  Scheduled:  . fluticasone furoate-vilanterol  1 puff Inhalation Daily  . insulin aspart  2-6 Units Subcutaneous Q4H  . ipratropium-albuterol  3 mL Nebulization TID  . lidocaine  1 application Urethral Once  . metoprolol succinate  25 mg Oral Daily  . pantoprazole  40 mg Oral Daily  . sacubitril-valsartan  1 tablet Oral BID  . tamsulosin  0.4 mg Oral Daily  . umeclidinium bromide  1 puff Inhalation Daily   Infusions:  . heparin 800 Units/hr (06/08/18 0328)  . methylPREDNISolone (SOLU-MEDROL) injection Stopped (06/08/18 0012)    Assessment: Pt is here for his ILD and PNA. He has been on xarelto for his PE. His last dose was last PM at 1900. Plan is to transition to IV heparin due to the need to possible bx. We will get baseline heparin level and PTT for correlation>>baseline heparin level is elevated at >2.2 due to Xarelto use.  2/16 AM update: aPTT is low at 49, no issues per RN.  Goal  of Therapy:  Heparin level 0.3-0.7 units/ml  APTT 66-102 Monitor platelets by anticoagulation protocol: Yes   Plan:  Inc heparin to 950 units/hr Re-check aPTT at [place], PharmD, [place] Pharmacist Phone: [phone]

## 2018-06-08 NOTE — Consult Note (Signed)
[address]       [place] [address]             [phone]        Kyrin M Brayboy [facility] Medical Record #[id number] Date of Birth: [birth year]  Referring: Dr. Chase Caller  primary Care: [name], L.Marlou Sa, MD Primary Cardiologist:No primary care provider on file.  Chief Complaint: Shortness of breath Chief Complaint  Patient presents with  . Shortness of Breath  . Tachycardia  Patient examined, images of recent CT scan of chest and echocardiogram personally reviewed  History of Present Illness:     Asked to see this very nice 58 year old reformed smoker for surgical lung biopsy.  Patient had a pulmonary embolus in the fall 2019.  He was treated with anticoagulation and required home oxygen 2 L.  Since that time his oxygen requirements have steadily escalated.  In light January he underwent bronchoscopy and transbronchial biopsy which was nondiagnostic.  Most recent CT scan of chest showed worsening with lower lobe consolidative densities.  He is now on 6 L of nasal cannula to main saturation 88 to 90%.  He has been started on IV steroids. His father died of pulmonary fibrosis. He stopped smoking after the pulmonary embolus in late 2019.  In December 2019 his DLCO was 34%.  Echocardiogram this hospitalization shows EF 65%, mild RV dysfunction, no significant TR, no pericardial effusion.  Patient has been taking Xarelto since the pulmonary embolus without bleeding complication. He has been transitioned off Xarelto to heparin until possible lung biopsy.  Current Activity/ Functional Status: Patient has been losing weight and has had decreasing exercise tolerance associated with his shortness of breath and increased oxygen requirement   Zubrod Score: At the time of surgery this patient's most appropriate activity status/level should be described as: []     0    Normal activity, no symptoms []     1    Restricted in physical strenuous activity but ambulatory, able  to do out light work []     2    Ambulatory and capable of self care, unable to do work activities, up and about                 more than 50%  Of the time                            [x]     3    Only limited self care, in bed greater than 50% of waking hours []     4    Completely disabled, no self care, confined to bed or chair []     5    Moribund  Past Medical History:  Diagnosis Date  . Chronic back pain   . Emphysema lung ([place])   . Tobacco abuse     Past Surgical History:  Procedure Laterality Date  . BACK SURGERY    . VIDEO BRONCHOSCOPY Bilateral 05/22/2018   Procedure: VIDEO BRONCHOSCOPY WITH FLUORO;  Surgeon: Marshell Garfinkel, MD;  Location: [facility];  Service: Cardiopulmonary;  Laterality: Bilateral;    Social History   Tobacco Use  Smoking Status Former Smoker  . Types: Cigarettes  . Last attempt to quit: 02/12/2018  . Years since quitting: 0.3  Smokeless Tobacco Never Used    Social History   Substance and Sexual Activity  Alcohol Use Yes     Allergies  Allergen Reactions  . Chantix [Varenicline Tartrate]  Nausea And Vomiting    Current Facility-Administered Medications  Medication Dose Route Frequency Provider Last Rate Last Dose  . acetaminophen (TYLENOL) tablet 650 mg  650 mg Oral Q6H PRN Guilford Shi, MD       Or  . acetaminophen (TYLENOL) suppository 650 mg  650 mg Rectal Q6H PRN Guilford Shi, MD      . chlorpheniramine-HYDROcodone (TUSSIONEX) 10-8 MG/5ML suspension 5 mL  5 mL Oral Q12H PRN Guilford Shi, MD   5 mL at 06/07/18 1927  . fluticasone furoate-vilanterol (BREO ELLIPTA) 100-25 MCG/INH 1 puff  1 puff Inhalation Daily Guilford Shi, MD   1 puff at 06/08/18 0845  . heparin ADULT infusion 100 units/mL (25000 units/241mL sodium chloride 0.45%)  950 Units/hr Intravenous Continuous Erenest Blank, RPH 9.5 mL/hr at 06/08/18 0345 950 Units/hr at 06/08/18 0345  . HYDROcodone-acetaminophen (NORCO/VICODIN) 5-325 MG per tablet 1-2 tablet   1-2 tablet Oral Q4H PRN Guilford Shi, MD   2 tablet at 06/07/18 0923  . insulin aspart (novoLOG) injection 2-6 Units  2-6 Units Subcutaneous Q4H Brand Males, MD   2 Units at 06/08/18 0855  . ipratropium-albuterol (DUONEB) 0.5-2.5 (3) MG/3ML nebulizer solution 3 mL  3 mL Nebulization Q6H PRN Kamineni, Neelima, MD      . ipratropium-albuterol (DUONEB) 0.5-2.5 (3) MG/3ML nebulizer solution 3 mL  3 mL Nebulization TID Guilford Shi, MD   3 mL at 06/08/18 0843  . lidocaine (XYLOCAINE) 2 % jelly 1 application  1 application Urethral Once Dessa Phi, DO      . methylPREDNISolone sodium succinate (SOLU-MEDROL) 250 mg in sodium chloride 0.9 % 50 mL IVPB  250 mg Intravenous Q6H Brand Males, MD 104 mL/hr at 06/08/18 0854 250 mg at 06/08/18 0854  . metoprolol succinate (TOPROL-XL) 24 hr tablet 25 mg  25 mg Oral Daily Guilford Shi, MD   25 mg at 06/08/18 0855  . ondansetron (ZOFRAN) tablet 4 mg  4 mg Oral Q6H PRN Guilford Shi, MD       Or  . ondansetron (ZOFRAN) injection 4 mg  4 mg Intravenous Q6H PRN Guilford Shi, MD      . oxyCODONE-acetaminophen (PERCOCET/ROXICET) 5-325 MG per tablet 1-2 tablet  1-2 tablet Oral Q4H PRN Guilford Shi, MD   2 tablet at 06/08/18 0854  . pantoprazole (PROTONIX) EC tablet 40 mg  40 mg Oral Daily Guilford Shi, MD   40 mg at 06/08/18 0854  . pneumococcal 23 valent vaccine (PNU-IMMUNE) injection 0.5 mL  0.5 mL Intramuscular Prior to discharge Kamineni, Lamount Cranker, MD      . polyethylene glycol (MIRALAX / GLYCOLAX) packet 17 g  17 g Oral Daily PRN Guilford Shi, MD      . sacubitril-valsartan (ENTRESTO) 49-51 mg per tablet  1 tablet Oral BID Guilford Shi, MD   1 tablet at 06/08/18 0854  . tamsulosin (FLOMAX) capsule 0.4 mg  0.4 mg Oral Daily Dessa Phi, DO   0.4 mg at 06/08/18 0854  . umeclidinium bromide (INCRUSE ELLIPTA) 62.5 MCG/INH 1 puff  1 puff Inhalation Daily Guilford Shi, MD   1 puff at 06/08/18 0843     Medications Prior to Admission  Medication Sig Dispense Refill Last Dose  . albuterol (PROVENTIL HFA;VENTOLIN HFA) 108 (90 Base) MCG/ACT inhaler Inhale 2 puffs into the lungs every 4 (four) hours as needed for wheezing or shortness of breath. 1 Inhaler 3 06/07/2018 at prn  . Fluticasone-Umeclidin-Vilant (TRELEGY ELLIPTA) 100-62.5-25 MCG/INH AEPB Inhale 1 puff into the lungs daily. 1 each 3  06/04/2018 at Unknown time  . ipratropium-albuterol (DUONEB) 0.5-2.5 (3) MG/3ML SOLN Take 3 mLs by nebulization every 6 (six) hours as needed. (Patient taking differently: Take 3 mLs by nebulization every 6 (six) hours as needed (for shortness of breath or wheezing). ) 120 mL 3 06/04/2018 at prn  . metoprolol succinate (TOPROL-XL) 25 MG 24 hr tablet Take 1 tablet (25 mg total) by mouth daily. 30 tablet 0 06/05/2018 at 0700  . oxyCODONE-acetaminophen (PERCOCET/ROXICET) 5-325 MG tablet Take 1-2 tablets by mouth every 4 (four) hours as needed for moderate pain (Max of 5 tablets per day).    06/08/2018 at prn  . OXYGEN Inhale 6 L into the lungs daily.   05/27/2018 at Unknown time  . pantoprazole (PROTONIX) 40 MG tablet Take 1 tablet (40 mg total) by mouth daily. 30 tablet 1 06/05/2018 at Unknown time  . rivaroxaban (XARELTO) 20 MG TABS tablet Take 1 tablet (20 mg total) by mouth daily with supper. 30 tablet 0 06/05/2018 at 1900  . sacubitril-valsartan (ENTRESTO) 49-51 MG Take 1 tablet by mouth 2 (two) times daily.   06/03/2018  . chlorpheniramine-HYDROcodone (TUSSIONEX PENNKINETIC ER) 10-8 MG/5ML SUER Take 5 mLs by mouth every 12 (twelve) hours as needed for cough. (Patient not taking: Reported on 06/03/2018) 140 mL 0 Not Taking  . predniSONE (DELTASONE) 20 MG tablet Take 3 tablets (60 mg total) by mouth daily with breakfast. 30 tablet 0 Not Taking    Family History  Problem Relation Age of Onset  . Cancer Mother   . COPD Father      Review of Systems:   ROS      Cardiac Review of Systems: Y or  [    ]=  no  Chest Pain [    ]  Resting SOB Blue.Reese   ] Exertional SOB  [  ]  Orthopnea [  ]   Pedal Edema [   ]    Palpitations [  ] Syncope  [  ]   Presyncope [   ]  General Review of Systems: [Y] = yes [  ]=no Constitional: recent weight change Blue.Reese  ]; anorexia [  ]; fatigue [ y ]; nausea [  ]; night sweats [  ]; fever [  ]; or chills [  ]                                                               Dental: Last Dentist visit: Less than 1 year  Eye : blurred vision [  ]; diplopia [   ]; vision changes [  ];  Amaurosis fugax[  ]; Resp: cough [  ];  wheezing[  ];  hemoptysis[y  ]; shortness of breath[y  ]; paroxysmal nocturnal dyspnea[  ]; dyspnea on exertion[y  ]; or orthopnea[  ];  GI:  gallstones[  ], vomiting[  ];  dysphagia[  ]; melena[  ];  hematochezia [  ]; heartburn[  ];   Hx of  Colonoscopy[  ]; GU: kidney stones [  ]; hematuria[  ];   dysuria [  ];  nocturia[  ];  history of     obstruction [  ]; urinary frequency [  ]             Skin: rash, swelling[  ];,  hair loss[  ];  peripheral edema[  ];  or itching[  ]; Musculosketetal: myalgias[  ];  joint swelling[  ];  joint erythema[  ];  joint pain[  ];  back pain[  ];  Heme/Lymph: bruising[  ];  bleeding[  ];  anemia[  ];  Neuro: TIA[  ];  headaches[  ];  stroke[  ];  vertigo[  ];  seizures[  ];   paresthesias[  ];  difficulty walking[  ];  Psych:depression[  ]; anxiety[  ];  Endocrine: diabetes[  ];  thyroid dysfunction[  ];                  Physical Exam: BP (!) 84/57   Pulse 71   Temp 97.6 F (36.4 C) (Oral)   Resp (!) 22   Ht 5\' 9"  (1.753 m)   Wt 52.9 kg   SpO2 94%   BMI 17.22 kg/m       Physical Exam  General: 58 year old chronically ill male on nasal cannula oxygen HEENT: Normocephalic pupils equal , dentition adequate Neck: Supple without JVD, adenopathy, or bruit Chest: Coarse breath sounds with rales in the lower lung fields bilaterally, no tenderness             or deformity Cardiovascular: Regular rate and rhythm, no  murmur, no gallop, peripheral pulses             palpable in all extremities Abdomen:  Soft, nontender, no palpable mass or organomegaly Extremities: Warm, well-perfused, no clubbing cyanosis edema or tenderness,              no venous stasis changes of the legs Rectal/GU: Deferred Neuro: Grossly non--focal and symmetrical throughout Skin: Clean and dry without rash or ulceration    Diagnostic Studies & Laboratory data:     Recent Radiology Findings:   Ct Chest Wo Contrast  Result Date: 06/03/2018 CLINICAL DATA:  Worsening shortness of breath and oxygen requirement. History of emphysema and pulmonary embolism. Recent bronchoscopy showing no malignancy but mild cellular atypia. EXAM: CT CHEST WITHOUT CONTRAST TECHNIQUE: Multidetector CT imaging of the chest was performed following the standard protocol without IV contrast. COMPARISON:  Radiographs 05/31/2018 and 05/22/2018. CT 05/12/2018 and 03/18/2018. FINDINGS: Cardiovascular: Mild atherosclerosis of the aorta and great vessels again noted. No acute vascular findings on noncontrast imaging. The heart size is normal. There is no pericardial effusion. Mediastinum/Nodes: There are no enlarged mediastinal, hilar or axillary lymph nodes.Hilar assessment is limited by the lack of intravenous contrast, although the hilar contours appear unchanged. The thyroid gland, trachea and esophagus demonstrate no significant findings. Lungs/Pleura: There are trace dependent pleural effusions bilaterally which are stable. No pneumothorax. Severe centrilobular emphysema again noted. Superimposed bilateral airspace opacities show further worsening. In both lower lobes, there is consolidation with volume loss and bronchiectasis. There is worsening involvement in the right middle and left upper lobes with areas of patchy and bandlike consolidation. No dominant lung mass or endobronchial lesion identified. Upper abdomen: The visualized upper abdomen appears stable without  acute findings. Musculoskeletal/Chest wall: There is no chest wall mass or suspicious osseous finding. IMPRESSION: 1. Further worsening of extensive bilateral airspace opacities superimposed on emphysema and lower lobe bronchiectasis. Again, findings are most likely inflammatory in etiology, possibly due to chronic aspiration or organizing pneumonia. 2. No dominant mass or adenopathy. 3. Stable small bilateral pleural effusions. 4. Aortic Atherosclerosis (ICD10-I70.0) and Emphysema (ICD10-J43.9). Electronically Signed   By: Richardean Sale M.D.   On: 05/27/2018 20:42  Dg Chest Port 1 View  Result Date: 05/25/2018 CLINICAL DATA:  Cough EXAM: PORTABLE CHEST 1 VIEW COMPARISON:  05/22/2018 FINDINGS: No significant interval change in dense heterogeneous airspace opacity of the left mid and lower lung seen on prior radiographs and CT. Underlying emphysema. No new airspace opacity. IMPRESSION: No significant interval change in dense heterogeneous airspace opacity of the left mid and lower lung seen on prior radiographs and CT. Underlying emphysema. No new airspace opacity. Electronically Signed   By: Eddie Candle M.D.   On: 05/25/2018 10:50     I have independently reviewed the above radiologic studies and discussed with the patient   Recent Lab Findings: Lab Results  Component Value Date   WBC 17.7 (H) 06/08/2018   HGB 11.6 (L) 06/08/2018   HCT 35.6 (L) 06/08/2018   PLT 269 06/08/2018   GLUCOSE 141 (H) 06/08/2018   ALT 10 06/16/2018   AST 14 (L) 06/16/2018   NA 137 06/08/2018   K 3.7 06/08/2018   CL 108 06/08/2018   CREATININE 0.90 06/08/2018   BUN 15 06/08/2018   CO2 23 06/08/2018   TSH 0.954 06/11/2018   INR 1.18 05/29/2018      Assessment / Plan:   Interstitial lung disease with rapidly increasing oxygen requirement History of pulmonary embolus November 2019 on chronic anticoagulation Strong smoking history Nondiagnostic bronchoscopic transbronchial lung biopsy  I have discussed  this patient with Dr. Chase Caller for coordination of care.  I agree that surgical lung biopsy would be appropriate to help optimize the patient's care.  Will allow 48 more hours for Xarelto washout and schedule left VATS and lower lobe lung biopsy Tuesday, February 18.  I have discussed the procedure in detail with the patient.  He understands with his degree of lung disease that he will need ICU care after procedure and will probably also need short-term or long-term mechanical ventilation.       @ME1 @ 06/08/2018 9:33 AM

## 2018-06-08 NOTE — Progress Notes (Signed)
[facility] for Heparin (Xarelto on hold) Indication: History of  pulmonary embolus  Allergies  Allergen Reactions  . Chantix [Varenicline Tartrate] Nausea And Vomiting    Patient Measurements: Height: 5\' 9"  (175.3 cm) Weight: 116 lb 10 oz (52.9 kg) IBW/kg (Calculated) : 70.7 Heparin Dosing Weight: 53 kg  Vital Signs: Temp: 98 F (36.7 C) (02/16 1202) Temp Source: Oral (02/16 1202) BP: 101/65 (02/16 1202) Pulse Rate: 55 (02/16 1202)  Labs: Recent Labs    05/26/2018 1020 05/26/2018 1428 06/07/2018 2314 06/07/18 0302 06/07/18 1048 06/08/18 0222 06/08/18 1207  HGB 11.7* 11.6*  --  12.6*  --  11.6*  --   HCT 36.4* 34.0*  --  38.2*  --  35.6*  --   PLT 257  --   --  280  --  269  --   APTT  --   --   --   --  36 49* 69*  LABPROT 14.9  --   --   --   --   --   --   INR 1.18  --   --   --   --   --   --   HEPARINUNFRC  --   --   --   --  >2.20*  --   --   CREATININE 1.00  --  0.91 0.88  --  0.90  --   TROPONINI <0.03  --   --   --   --   --   --     Estimated Creatinine Clearance: 67.8 mL/min (by C-G formula based on SCr of 0.9 mg/dL).   Medical History: Past Medical History:  Diagnosis Date  . Chronic back pain   . Emphysema lung ([place])   . Tobacco abuse     Medications:  Medications Prior to Admission  Medication Sig Dispense Refill Last Dose  . albuterol (PROVENTIL HFA;VENTOLIN HFA) 108 (90 Base) MCG/ACT inhaler Inhale 2 puffs into the lungs every 4 (four) hours as needed for wheezing or shortness of breath. 1 Inhaler 3 06/05/2018 at prn  . Fluticasone-Umeclidin-Vilant (TRELEGY ELLIPTA) 100-62.5-25 MCG/INH AEPB Inhale 1 puff into the lungs daily. 1 each 3 05/31/2018 at Unknown time  . ipratropium-albuterol (DUONEB) 0.5-2.5 (3) MG/3ML SOLN Take 3 mLs by nebulization every 6 (six) hours as needed. (Patient taking differently: Take 3 mLs by nebulization every 6 (six) hours as needed (for shortness of breath or wheezing). ) 120 mL 3  06/04/2018 at prn  . metoprolol succinate (TOPROL-XL) 25 MG 24 hr tablet Take 1 tablet (25 mg total) by mouth daily. 30 tablet 0 06/05/2018 at 0700  . oxyCODONE-acetaminophen (PERCOCET/ROXICET) 5-325 MG tablet Take 1-2 tablets by mouth every 4 (four) hours as needed for moderate pain (Max of 5 tablets per day).    06/19/2018 at prn  . OXYGEN Inhale 6 L into the lungs daily.   05/31/2018 at Unknown time  . pantoprazole (PROTONIX) 40 MG tablet Take 1 tablet (40 mg total) by mouth daily. 30 tablet 1 06/05/2018 at Unknown time  . rivaroxaban (XARELTO) 20 MG TABS tablet Take 1 tablet (20 mg total) by mouth daily with supper. 30 tablet 0 06/05/2018 at 1900  . sacubitril-valsartan (ENTRESTO) 49-51 MG Take 1 tablet by mouth 2 (two) times daily.   06/03/2018  . chlorpheniramine-HYDROcodone (TUSSIONEX PENNKINETIC ER) 10-8 MG/5ML SUER Take 5 mLs by mouth every 12 (twelve) hours as needed for cough. (Patient not taking: Reported on 06/03/2018) 140 mL 0  Not Taking  . predniSONE (DELTASONE) 20 MG tablet Take 3 tablets (60 mg total) by mouth daily with breakfast. 30 tablet 0 Not Taking   Scheduled:  . fluticasone furoate-vilanterol  1 puff Inhalation Daily  . insulin aspart  2-6 Units Subcutaneous TID WC  . ipratropium-albuterol  3 mL Nebulization TID  . lidocaine  1 application Urethral Once  . metoprolol succinate  25 mg Oral Daily  . pantoprazole  40 mg Oral Daily  . sacubitril-valsartan  1 tablet Oral BID  . tamsulosin  0.4 mg Oral Daily  . umeclidinium bromide  1 puff Inhalation Daily   Infusions:  . heparin 950 Units/hr (06/08/18 0345)  . methylPREDNISolone (SOLU-MEDROL) injection 250 mg (06/08/18 1256)    Assessment: Pt is here for his ILD and PNA. He has been on xarelto for his PE. His last dose was last PM at 1900. Plan is to transition to IV heparin due to the need to possible bx. We will get baseline heparin level and PTT for correlation>>baseline heparin level is elevated at >2.2 due to Xarelto  use.  PTT came back therapeutic at 69 this PM. Will increase slightly and do a confirm level.   Goal of Therapy:  Heparin level 0.3-0.7 units/ml  APTT 66-102 Monitor platelets by anticoagulation protocol: Yes   Plan:   Increase heparin to 1000 units/hr Confirm PTT in 6 hrs  Onnie Boer, PharmD, [place], AAHIVP, CPP Infectious Disease Pharmacist 06/08/2018 1:42 PM

## 2018-06-08 NOTE — Consult Note (Signed)
NAME:  Caleb Harper, MRN:  [id number], DOB:  09/01/1960, LOS: 2 ADMISSION DATE:  06/19/2018, CONSULTATION DATE:  06/08/2018 REFERRING MD:  Dr. Sabra Heck, CHIEF COMPLAINT:  Shortness of breath   Brief History    Caleb Harper is  -58 year old male whose father died from pulmonary fibrosis and 20 14/2015.  He is a former smoker.  He works as an Clinical biochemist.  He is well-known to our service and sees Dr. Vaughan Browner.  History dates from October 2019 when he started developing insidious onset of shortness of breath that has been progressive since then.  Around Thanksgiving 2019 he was diagnosed with multifocal pulmonary embolism associated with right lower lobe groundglass opacities.  Since then he has been on anticoagulation.  Despite anticoagulation he has had progressive shortness of breath and hypoxemia.  On May 22, 2018 underwent bronchoscopy with lavage that showed neutrophilia with volume of 61% [normal is macrophages greater than 90%] and without lymphocytosis or eosinophilia.  A transbronchial biopsy at this time was nondiagnostic.  It appears at this time he might have been started on prednisone empiric which he finished 1 week ago.  Despite this he said progressive shortness of breath.  But because of continued shortness of breath and persistent infiltrates especially on CT scan of the chest May 12, 2018 with bilateral lower lobe consolidation a surgical lung biopsy was recommended.  He has upcoming appointment Dr. Roxan Hockey June 18, 2018.  However in the interim he has decompensated particularly in the last few weeks and now requiring even 6 L of oxygen at rest.  A few weeks ago he was only on 3 L oxygen.  At no point in time he has had fever or rash or joint issues.  Patient is quite upset that the rate of progression.  He feels some slowness in elucidating a diagnosis.  ILD specific hx -  reports that he quit smoking about 3 months ago. His smoking use included cigarettes. He has never used  smokeless tobacco.   - emphysema on CT    - Dad died at [facility] in June 05, 2012 from pulmonary fibrosis   - ANA,RF, CC - negative June 05, 2018  - BAL -neutrophilia Jan 2020   -works as Clinical biochemist , denies mold expsure  Past Medical History  H/o PE (November, on anticoagulation with xarelto) Emphysema Tobacco use  [facility] Events   2/14 - [facility] to [facility], cardiology and CCM consulted ./  Started high dose IV steroids - 1g/day 2/15 -  Feels "tad better" better. Down to 6L HFNC. EF improved in echo at admit . Concerns for VSD in echo but cards does not think so clinically. On IV heparin in case in a few days he needs to have surgical lung biopsy. PCT noral RVP pending   Consults:  PCCM Cardiology   Procedures:  None   Significant Diagnostic Tests:  Repeat tests 2/14- echo 65%, Possible VSD but cards doubts clinically 2/14 - urine strep - neg 2/14 -SCL 70 - neg -SSA/SSB - neg -ANA - neg - RF - 15.5 -DS DNA - neg -MPO- neg -PR3neg -anti GBM  - ESR 42   Micro Data:  RVP (2/14) > pending Blood cx (2/14) > pending   Antimicrobials:  Cefepime (2/14>) Vancomycin (2/14>)   SUBJECTIVE/OVERNIGHT/INTERVAL HX   2/15 - d2 high dose steroids. On 40% face mask by RT after they felt [place] not best route - "making him dry and mouth breathing"> RN repors easy desaturations when he sits  up and transfers. He thinks he I sbetter  Objective   Blood pressure 101/65, pulse 71, temperature 98 F (36.7 C), temperature source Oral, resp. rate 20, height _0  (1.753 m), weight 52.9 kg, SpO2 93 %.    FiO2 (%):  [40 %] 40 %   Intake/Output Summary (Last 24 hours) at 06/08/2018 1522 Last data filed at 06/08/2018 0854 Gross per 24 hour  Intake 624.25 ml  Output 600 ml  Net 24.25 ml   Filed Weights   05/25/2018 1012 05/29/2018 1817  Weight: 53.5 kg 52.9 kg  General Appearance:  Looks same. . On face mask o2 Head:  Normocephalic, without obvious abnormality,  atraumatic Eyes:  PERRL - yes, conjunctiva/corneas -clear     Ears:  Normal external ear canals, both ears Nose:  G tube - no Throat:  ETT TUBE - no , OG tube - no.Has FACE MASK 40% - pulse ox 92% Neck:  Supple,  No enlargement/tenderness/nodules Lungs: Mld tachypnea. Basal crackles Heart:  S1 and S2 normal, no murmur, CVP - no.  Pressors - no Abdomen:  Soft, no masses, no organomegaly Genitalia / Rectal:  Not done Extremities:  Extremities- intact Skin:  ntact in exposed areas . Sacral area - not examined Neurologic:  Sedation - none -> RASS - +1 . Moves all 4s - yes. CAM-ICU - neg . Orientation - x3+         LABS    PULMONARY Recent Labs  Lab 06/14/2018 1428  PHART 7.466*  PCO2ART 31.5*  PO2ART 56.0*  HCO3 22.8  TCO2 24  O2SAT 91.0    CBC Recent Labs  Lab 05/27/2018 1020 06/08/2018 1428 06/07/18 0302 06/08/18 0222  HGB 11.7* 11.6* 12.6* 11.6*  HCT 36.4* 34.0* 38.2* 35.6*  WBC 13.7*  --  14.1* 17.7*  PLT 257  --  280 269    COAGULATION Recent Labs  Lab 06/08/2018 1020  INR 1.18    CARDIAC   Recent Labs  Lab 06/07/2018 1020  TROPONINI <0.03   No results for input(s): PROBNP in the last 168 hours.   CHEMISTRY Recent Labs  Lab 06/12/2018 1020 05/31/2018 1428 06/03/2018 2314 06/07/18 0302 06/08/18 0222  NA 139 138 138 140 137  K 3.6 3.7 3.5 3.9 3.7  CL 107  --  107 105 108  CO2 23  --  20* 23 23  GLUCOSE 93  --  109* 110* 141*  BUN 11  --  _1 CREATININE 1.00  --  0.91 0.88 0.90  CALCIUM 8.4*  --  8.4* 8.8* 8.4*  MG 1.8  --  1.7  --   --    Estimated Creatinine Clearance: 67.8 mL/min (by C-G formula based on SCr of 0.9 mg/dL).   LIVER Recent Labs  Lab 06/13/2018 1020  AST 14*  ALT 10  ALKPHOS 67  BILITOT 0.8  PROT 5.6*  ALBUMIN 2.5*  INR 1.18     INFECTIOUS Recent Labs  Lab 06/11/2018 1020 06/14/2018 1943 06/08/2018 2311 06/07/18 0302 06/08/18 0222  LATICACIDVEN 2.0* 2.5* 1.3  --   --   PROCALCITON  --  <0.10  --  <0.10 <0.10      ENDOCRINE CBG (last 3)  Recent Labs    06/08/18 0354 06/08/18 0842 06/08/18 1201  GLUCAP 145* 131* 123*         IMAGING x48h  - image(s) personally visualized  -   highlighted in bold Ct Chest Wo Contrast  Result Date: 06/17/2018 CLINICAL DATA:  Worsening shortness of breath and oxygen requirement. History of emphysema and pulmonary embolism. Recent bronchoscopy showing no malignancy but mild cellular atypia. EXAM: CT CHEST WITHOUT CONTRAST TECHNIQUE: Multidetector CT imaging of the chest was performed following the standard protocol without IV contrast. COMPARISON:  Radiographs 06/17/2018 and 05/22/2018. CT 05/12/2018 and 03/18/2018. FINDINGS: Cardiovascular: Mild atherosclerosis of the aorta and great vessels again noted. No acute vascular findings on noncontrast imaging. The heart size is normal. There is no pericardial effusion. Mediastinum/Nodes: There are no enlarged mediastinal, hilar or axillary lymph nodes.Hilar assessment is limited by the lack of intravenous contrast, although the hilar contours appear unchanged. The thyroid gland, trachea and esophagus demonstrate no significant findings. Lungs/Pleura: There are trace dependent pleural effusions bilaterally which are stable. No pneumothorax. Severe centrilobular emphysema again noted. Superimposed bilateral airspace opacities show further worsening. In both lower lobes, there is consolidation with volume loss and bronchiectasis. There is worsening involvement in the right middle and left upper lobes with areas of patchy and bandlike consolidation. No dominant lung mass or endobronchial lesion identified. Upper abdomen: The visualized upper abdomen appears stable without acute findings. Musculoskeletal/Chest wall: There is no chest wall mass or suspicious osseous finding. IMPRESSION: 1. Further worsening of extensive bilateral airspace opacities superimposed on emphysema and lower lobe bronchiectasis. Again, findings are most  likely inflammatory in etiology, possibly due to chronic aspiration or organizing pneumonia. 2. No dominant mass or adenopathy. 3. Stable small bilateral pleural effusions. 4. Aortic Atherosclerosis (ICD10-I70.0) and Emphysema (ICD10-J43.9). Electronically Signed   By: Richardean Sale M.D.   On: 06/12/2018 20:42     Assessment & Plan:  Acute on chronic progressive hypoxemic resp failure with infiltrates Idiopathich ILD - Radiology pattern is alternate to UIP and suggestive of BOOP  06/08/2018  - unclear if better or not. Needing 40% FACE MASK which is 5L [place]. He feels better. Not sure objectively if truly better. D2 high dose steroid.  Repeat expanded autoimmune panel negative -    Plan - IV heparin to continue - in view towards SLB on Tuesday 05/26/2018 by Dr Nils Pyle  - get CXR 06/09/18 - o2 ffor pulse ox > 88%  - continue IV solumedrol 263m IV q6h - await echo bubble study results 06/07/2018 - Based on response 06/09/2018 and above -> CCM to make risk decision about SLB for 06/20/2018   = less hypoxemic the better but I have told him we are dealing with diagnostic uncertainty and if he progresses he will not survive. We might have to accept bioppsy risk.    Best practice:  Family Communication: brother 05/31/2018. PAtient 2/15 and 06/08/2018 Disposition: Admit to [facility], CCM consulted      SIGNATURE    Dr. MBrand Males M.D., F.C.C.P,  Pulmonary and Critical Care Medicine Staff Physician, CBaileyvilleDirector - Interstitial Lung Disease  Program  Pulmonary [place] [id number] Pager: [phone] If no answer or between  15:00h - 7:00h: call [phone] Telephone: [phone]  3:22 PM 06/08/2018

## 2018-06-08 NOTE — Progress Notes (Addendum)
[facility] for Heparin (Xarelto on hold) Indication: History of  pulmonary embolus  Allergies  Allergen Reactions  . Chantix [Varenicline Tartrate] Nausea And Vomiting    Patient Measurements: Height: 5\' 9"  (175.3 cm) Weight: 116 lb 10 oz (52.9 kg) IBW/kg (Calculated) : 70.7 Heparin Dosing Weight: 53 kg  Vital Signs: Temp: 97.5 F (36.4 C) (02/16 1907) Temp Source: Oral (02/16 1907) BP: 97/64 (02/16 1907) Pulse Rate: 69 (02/16 1907)  Labs: Recent Labs    05/24/2018 1020 06/01/2018 1428 05/24/2018 2314 06/07/18 0302  06/07/18 1048 06/08/18 0222 06/08/18 1207 06/08/18 1940  HGB 11.7* 11.6*  --  12.6*  --   --  11.6*  --   --   HCT 36.4* 34.0*  --  38.2*  --   --  35.6*  --   --   PLT 257  --   --  280  --   --  269  --   --   APTT  --   --   --   --    < > 36 49* 69* 60*  LABPROT 14.9  --   --   --   --   --   --   --   --   INR 1.18  --   --   --   --   --   --   --   --   HEPARINUNFRC  --   --   --   --   --  >2.20*  --   --   --   CREATININE 1.00  --  0.91 0.88  --   --  0.90  --   --   TROPONINI <0.03  --   --   --   --   --   --   --   --    < > = values in this interval not displayed.    Estimated Creatinine Clearance: 67.8 mL/min (by C-G formula based on SCr of 0.9 mg/dL).   Medical History: Past Medical History:  Diagnosis Date  . Chronic back pain   . Emphysema lung ([place])   . Tobacco abuse     Medications:  Medications Prior to Admission  Medication Sig Dispense Refill Last Dose  . albuterol (PROVENTIL HFA;VENTOLIN HFA) 108 (90 Base) MCG/ACT inhaler Inhale 2 puffs into the lungs every 4 (four) hours as needed for wheezing or shortness of breath. 1 Inhaler 3 06/18/2018 at prn  . Fluticasone-Umeclidin-Vilant (TRELEGY ELLIPTA) 100-62.5-25 MCG/INH AEPB Inhale 1 puff into the lungs daily. 1 each 3 06/01/2018 at Unknown time  . ipratropium-albuterol (DUONEB) 0.5-2.5 (3) MG/3ML SOLN Take 3 mLs by nebulization every 6 (six)  hours as needed. (Patient taking differently: Take 3 mLs by nebulization every 6 (six) hours as needed (for shortness of breath or wheezing). ) 120 mL 3 06/04/2018 at prn  . metoprolol succinate (TOPROL-XL) 25 MG 24 hr tablet Take 1 tablet (25 mg total) by mouth daily. 30 tablet 0 06/05/2018 at 0700  . oxyCODONE-acetaminophen (PERCOCET/ROXICET) 5-325 MG tablet Take 1-2 tablets by mouth every 4 (four) hours as needed for moderate pain (Max of 5 tablets per day).    06/02/2018 at prn  . OXYGEN Inhale 6 L into the lungs daily.   05/25/2018 at Unknown time  . pantoprazole (PROTONIX) 40 MG tablet Take 1 tablet (40 mg total) by mouth daily. 30 tablet 1 06/05/2018 at Unknown time  . rivaroxaban (XARELTO) 20 MG TABS  tablet Take 1 tablet (20 mg total) by mouth daily with supper. 30 tablet 0 06/05/2018 at 1900  . sacubitril-valsartan (ENTRESTO) 49-51 MG Take 1 tablet by mouth 2 (two) times daily.   06/03/2018  . chlorpheniramine-HYDROcodone (TUSSIONEX PENNKINETIC ER) 10-8 MG/5ML SUER Take 5 mLs by mouth every 12 (twelve) hours as needed for cough. (Patient not taking: Reported on 06/03/2018) 140 mL 0 Not Taking  . predniSONE (DELTASONE) 20 MG tablet Take 3 tablets (60 mg total) by mouth daily with breakfast. 30 tablet 0 Not Taking   Scheduled:  . fluticasone furoate-vilanterol  1 puff Inhalation Daily  . insulin aspart  2-6 Units Subcutaneous TID WC  . ipratropium-albuterol  3 mL Nebulization TID  . lidocaine  1 application Urethral Once  . metoprolol succinate  25 mg Oral Daily  . pantoprazole  40 mg Oral Daily  . sacubitril-valsartan  1 tablet Oral BID  . tamsulosin  0.4 mg Oral Daily  . umeclidinium bromide  1 puff Inhalation Daily   Infusions:  . heparin 1,000 Units/hr (06/08/18 1404)  . methylPREDNISolone (SOLU-MEDROL) injection 250 mg (06/08/18 1821)    Assessment: Pt is here for his ILD and PNA. He has been on xarelto for his PE. His last dose was last PM at 1900. Plan is to transition to IV heparin  due to the need to possible bx. We will get baseline heparin level and PTT for correlation>>baseline heparin level is elevated at >2.2 due to Xarelto use.  PTT came back subtherapeutic again this PM. Will adjust dose and check AM level.  Goal of Therapy:  Heparin level 0.3-0.7 units/ml  APTT 66-102 Monitor platelets by anticoagulation protocol: Yes   Plan:   Increase heparin to 1100 units/hr Confirm PTT in 6 hrs  Onnie Boer, PharmD, [place], AAHIVP, CPP Infectious Disease Pharmacist 06/08/2018 8:14 PM

## 2018-06-08 NOTE — Progress Notes (Signed)
  PROGRESS NOTE  Called by RN, patient desatted to the 80s while getting to the side of the bed. RT in the room, patient now improving on venti mask, SpO2 low 90s on my exam. He states his breathing is better than it was during the "episode." He does not appear to be in acute distress, no accessory muscle use on my examination. Continue supplemental O2, titrate as needed.    Dessa Phi, DO [facility] [email or web address] 06/08/2018, 3:07 PM

## 2018-06-08 NOTE — Progress Notes (Signed)
PROGRESS NOTE    Caleb Harper  XVQ:[id number] DOB: [birth year] DOA: 05/24/2018 PCP: [name], L.Marlou Sa, MD     Brief Narrative:  Caleb Harper is a 58 y.o. male with history h/o COPD/emphysema who quit smoking 3 months back and well-known to pulmonary clinic presents with worsening dyspnea and increasing oxygen requirement.  Patient reports being admitted to this [facility] in November for respiratory issues, diagnosed with subacute versus chronic PE/pulmonary infarct and was discharged with home O2 2 L nasal cannula and Xarelto.  He states sometime after Christmas he started noticing recurrence of exertional dyspnea which progressively worsened over the last month.  He was evaluated by his primary pulmonologist Dr.Mannam last week and advised to increase O2 to 4 L.  Patient states over the last couple of days he increased O2 to 6 L as he noticed worsening dyspnea with minimal exertion.  He does have a home pulse oximetry and reports intermittent O2 sat drops to 86% on 6 L.  He also reports 15 to 20 pound weight loss since October.  Denies any hemoptysis, hematochezia or melena.  This morning he felt extremely short of breath and summoned EMS.  On their arrival, patient was alert, oriented but noted to be in SVT.  He received 1 dose of adenosine after which he was felt to have wide-complex tachycardia and received 150 mg bolus amiodarone followed by amnio drip and route. Patient converted to sinus rhythm upon arrival here and amnio drip was discontinued.  Patient evaluated by cardiology.   New events last 24 hours / Subjective: No acute issues overnight  Assessment & Plan:   Principal Problem:   Acute respiratory failure (HCC) Active Problems:   Interstitial lung disease ([place])   Acute on chronic hypoxic respiratory failure secondary to progressive ILD -Appreciate PCCM, note plan for surgical lung biopsy by cardiothoracic surgery -Respiratory viral panel pending, strep pneumo Ag negative and  legionella Ag pending -Procalcitonin <0.10  -Continue supplemental oxygen -High-dose IV Solu-Medrol  PSVT/AV nodal reentrant tachycardia: Secondary to problem #1 likely -Appreciate cardiology consultation -Would not give amiodarone due to his extensive pulmonary history -Started on metoprolol -This morning, patient is in sinus bradycardia with rate in the 50s  Recent history of PE -Hold Xarelto in setting of pursuing surgical lung biopsy, IV heparin ordered   Chronic systolic heart failure -Continue Entresto  Leukocytosis -On steroids, monitor   Acute urinary retention -Started on flomax. Foley placed.    DVT prophylaxis: IV heparin Code Status: Full code Family Communication: At bedside Disposition Plan: pending surgical lung biopsy Tuesday    Consultants:   PCCM  Cardiology  Cardiothoracic surgery   Procedures:   None  Antimicrobials:  Anti-infectives (From admission, onward)   Start     Dose/Rate Route Frequency Ordered Stop   06/07/18 1400  ceFEPIme (MAXIPIME) 2 g in sodium chloride 0.9 % 100 mL IVPB  Status:  Discontinued     2 g 200 mL/hr over 30 Minutes Intravenous Every 8 hours 06/07/18 1033 06/07/18 1639   06/07/18 1300  vancomycin (VANCOCIN) IVPB 1000 mg/200 mL premix  Status:  Discontinued     1,000 mg 200 mL/hr over 60 Minutes Intravenous Every 24 hours 06/18/2018 1209 06/07/18 1639   06/14/2018 2200  ceFEPIme (MAXIPIME) 1 g in sodium chloride 0.9 % 100 mL IVPB  Status:  Discontinued     1 g 200 mL/hr over 30 Minutes Intravenous Every 8 hours 06/02/2018 1209 06/07/18 1033   05/26/2018 1130  vancomycin (VANCOCIN)  IVPB 1000 mg/200 mL premix     1,000 mg 200 mL/hr over 60 Minutes Intravenous  Once 06/18/2018 1123 06/08/2018 1253   06/05/2018 1130  ceFEPIme (MAXIPIME) 2 g in sodium chloride 0.9 % 100 mL IVPB     2 g 200 mL/hr over 30 Minutes Intravenous  Once 06/05/2018 1123 05/27/2018 1228       Objective: Vitals:   06/07/18 1957 06/07/18 2300 06/08/18 0307  06/08/18 0845  BP:  101/67 95/70 (!) 84/57  Pulse:  (!) 52 (!) 56 71  Resp:  18 15 (!) 22  Temp:  97.6 F (36.4 C) 97.6 F (36.4 C)   TempSrc:  Oral Oral   SpO2: 92% 94% 94%   Weight:      Height:        Intake/Output Summary (Last 24 hours) at 06/08/2018 1200 Last data filed at 06/08/2018 0300 Gross per 24 hour  Intake 464.25 ml  Output 600 ml  Net -135.75 ml   Filed Weights   06/20/2018 1012 06/17/2018 1817  Weight: 53.5 kg 52.9 kg    Examination: General exam: Appears calm and comfortable  Respiratory system: Fine crackles, no respiratory distress Cardiovascular system: S1 & S2 heard, bradycardia rate 50s. No JVD, murmurs, rubs, gallops or clicks. No pedal edema. Gastrointestinal system: Abdomen is nondistended, soft and nontender. No organomegaly or masses felt. Normal bowel sounds heard. Central nervous system: Alert and oriented. No focal neurological deficits. Extremities: Symmetric 5 x 5 power. Skin: No rashes, lesions or ulcers Psychiatry: Judgement and insight appear normal. Mood & affect appropriate.   Data Reviewed: I have personally reviewed following labs and imaging studies  CBC: Recent Labs  Lab 06/09/2018 1020 06/09/2018 1428 06/07/18 0302 06/08/18 0222  WBC 13.7*  --  14.1* 17.7*  NEUTROABS 10.8*  --   --   --   HGB 11.7* 11.6* 12.6* 11.6*  HCT 36.4* 34.0* 38.2* 35.6*  MCV 92.9  --  88.8 89.7  PLT 257  --  280 297   Basic Metabolic Panel: Recent Labs  Lab 06/02/2018 1020 06/14/2018 1428 06/07/2018 2314 06/07/18 0302 06/08/18 0222  NA 139 138 138 140 137  K 3.6 3.7 3.5 3.9 3.7  CL 107  --  107 105 108  CO2 23  --  20* 23 23  GLUCOSE 93  --  109* 110* 141*  BUN 11  --  9 7 15   CREATININE 1.00  --  0.91 0.88 0.90  CALCIUM 8.4*  --  8.4* 8.8* 8.4*  MG 1.8  --  1.7  --   --    GFR: Estimated Creatinine Clearance: 67.8 mL/min (by C-G formula based on SCr of 0.9 mg/dL). Liver Function Tests: Recent Labs  Lab 06/07/2018 1020  AST 14*  ALT 10    ALKPHOS 67  BILITOT 0.8  PROT 5.6*  ALBUMIN 2.5*   No results for input(s): LIPASE, AMYLASE in the last 168 hours. No results for input(s): AMMONIA in the last 168 hours. Coagulation Profile: Recent Labs  Lab 06/13/2018 1020  INR 1.18   Cardiac Enzymes: Recent Labs  Lab 06/04/2018 1020  TROPONINI <0.03   BNP (last 3 results) No results for input(s): PROBNP in the last 8760 hours. HbA1C: No results for input(s): HGBA1C in the last 72 hours. CBG: Recent Labs  Lab 06/07/18 1719 06/07/18 1940 06/08/18 0033 06/08/18 0354 06/08/18 0842  GLUCAP 138* 132* 139* 145* 131*   Lipid Profile: No results for input(s): CHOL, HDL, LDLCALC, TRIG, CHOLHDL,  LDLDIRECT in the last 72 hours. Thyroid Function Tests: Recent Labs    05/28/2018 1943  TSH 0.954   Anemia Panel: No results for input(s): VITAMINB12, FOLATE, FERRITIN, TIBC, IRON, RETICCTPCT in the last 72 hours. Sepsis Labs: Recent Labs  Lab 06/13/2018 1020 06/14/2018 1943 05/26/2018 2311 06/07/18 0302 06/08/18 0222  PROCALCITON  --  <0.10  --  <0.10 <0.10  LATICACIDVEN 2.0* 2.5* 1.3  --   --     Recent Results (from the past 240 hour(s))  Blood Culture (routine x 2)     Status: None (Preliminary result)   Collection Time: 06/05/2018 10:50 AM  Result Value Ref Range Status   Specimen Description BLOOD RIGHT HAND  Final   Special Requests   Final    BOTTLES DRAWN AEROBIC AND ANAEROBIC Blood Culture results may not be optimal due to an inadequate volume of blood received in culture bottles   Culture   Final    NO GROWTH 2 DAYS Performed at [facility] [address] [place], [place] [address]    Report Status PENDING  Incomplete  Blood Culture (routine x 2)     Status: None (Preliminary result)   Collection Time: 05/24/2018 11:02 AM  Result Value Ref Range Status   Specimen Description BLOOD RIGHT ANTECUBITAL  Final   Special Requests   Final    BOTTLES DRAWN AEROBIC AND ANAEROBIC Blood Culture adequate volume    Culture   Final    NO GROWTH 2 DAYS Performed at [facility], [place] [address] [place], [place] [address]    Report Status PENDING  Incomplete  MRSA PCR Screening     Status: None   Collection Time: 05/28/2018  6:23 PM  Result Value Ref Range Status   MRSA by PCR NEGATIVE NEGATIVE Final    Comment:        The GeneXpert MRSA Assay (FDA approved for NASAL specimens only), is one component of a comprehensive MRSA colonization surveillance program. It is not intended to diagnose MRSA infection nor to guide or monitor treatment for MRSA infections. Performed at [facility], [place] [address], [place], [place] [address]        Radiology Studies: Ct Chest Wo Contrast  Result Date: 06/05/2018 CLINICAL DATA:  Worsening shortness of breath and oxygen requirement. History of emphysema and pulmonary embolism. Recent bronchoscopy showing no malignancy but mild cellular atypia. EXAM: CT CHEST WITHOUT CONTRAST TECHNIQUE: Multidetector CT imaging of the chest was performed following the standard protocol without IV contrast. COMPARISON:  Radiographs 05/24/2018 and 05/22/2018. CT 05/12/2018 and 03/18/2018. FINDINGS: Cardiovascular: Mild atherosclerosis of the aorta and great vessels again noted. No acute vascular findings on noncontrast imaging. The heart size is normal. There is no pericardial effusion. Mediastinum/Nodes: There are no enlarged mediastinal, hilar or axillary lymph nodes.Hilar assessment is limited by the lack of intravenous contrast, although the hilar contours appear unchanged. The thyroid gland, trachea and esophagus demonstrate no significant findings. Lungs/Pleura: There are trace dependent pleural effusions bilaterally which are stable. No pneumothorax. Severe centrilobular emphysema again noted. Superimposed bilateral airspace opacities show further worsening. In both lower lobes, there is consolidation with volume loss and bronchiectasis. There is worsening involvement in  the right middle and left upper lobes with areas of patchy and bandlike consolidation. No dominant lung mass or endobronchial lesion identified. Upper abdomen: The visualized upper abdomen appears stable without acute findings. Musculoskeletal/Chest wall: There is no chest wall mass or suspicious osseous finding. IMPRESSION: 1.  Further worsening of extensive bilateral airspace opacities superimposed on emphysema and lower lobe bronchiectasis. Again, findings are most likely inflammatory in etiology, possibly due to chronic aspiration or organizing pneumonia. 2. No dominant mass or adenopathy. 3. Stable small bilateral pleural effusions. 4. Aortic Atherosclerosis (ICD10-I70.0) and Emphysema (ICD10-J43.9). Electronically Signed   By: Richardean Sale M.D.   On: 06/04/2018 20:42      Scheduled Meds: . fluticasone furoate-vilanterol  1 puff Inhalation Daily  . insulin aspart  2-6 Units Subcutaneous Q4H  . ipratropium-albuterol  3 mL Nebulization TID  . lidocaine  1 application Urethral Once  . metoprolol succinate  25 mg Oral Daily  . pantoprazole  40 mg Oral Daily  . sacubitril-valsartan  1 tablet Oral BID  . tamsulosin  0.4 mg Oral Daily  . umeclidinium bromide  1 puff Inhalation Daily   Continuous Infusions: . heparin 950 Units/hr (06/08/18 0345)  . methylPREDNISolone (SOLU-MEDROL) injection 250 mg (06/08/18 0854)     LOS: 2 days    Time spent: 20 minutes   Dessa Phi, DO [facility] [email or web address] 06/08/2018, 12:00 PM

## 2018-06-09 ENCOUNTER — Inpatient Hospital Stay (HOSPITAL_COMMUNITY): Payer: BLUE CROSS/BLUE SHIELD

## 2018-06-09 LAB — CBC
HCT: 33.1 % — ABNORMAL LOW (ref 39.0–52.0)
Hemoglobin: 10.9 g/dL — ABNORMAL LOW (ref 13.0–17.0)
MCH: 30 pg (ref 26.0–34.0)
MCHC: 32.9 g/dL (ref 30.0–36.0)
MCV: 91.2 fL (ref 80.0–100.0)
Platelets: 293 10*3/uL (ref 150–400)
RBC: 3.63 MIL/uL — ABNORMAL LOW (ref 4.22–5.81)
RDW: 13.3 % (ref 11.5–15.5)
WBC: 22.5 10*3/uL — ABNORMAL HIGH (ref 4.0–10.5)
nRBC: 0 % (ref 0.0–0.2)

## 2018-06-09 LAB — T4, FREE: Free T4: 1.01 ng/dL (ref 0.82–1.77)

## 2018-06-09 LAB — URINALYSIS, ROUTINE W REFLEX MICROSCOPIC
Bilirubin Urine: NEGATIVE
Glucose, UA: NEGATIVE mg/dL
Hgb urine dipstick: NEGATIVE
Ketones, ur: NEGATIVE mg/dL
Leukocytes,Ua: NEGATIVE
Nitrite: NEGATIVE
Protein, ur: NEGATIVE mg/dL
Specific Gravity, Urine: 1.031 — ABNORMAL HIGH (ref 1.005–1.030)
pH: 6 (ref 5.0–8.0)

## 2018-06-09 LAB — GLUCOSE, CAPILLARY
Glucose-Capillary: 124 mg/dL — ABNORMAL HIGH (ref 70–99)
Glucose-Capillary: 126 mg/dL — ABNORMAL HIGH (ref 70–99)
Glucose-Capillary: 127 mg/dL — ABNORMAL HIGH (ref 70–99)
Glucose-Capillary: 155 mg/dL — ABNORMAL HIGH (ref 70–99)
Glucose-Capillary: 156 mg/dL — ABNORMAL HIGH (ref 70–99)
Glucose-Capillary: 158 mg/dL — ABNORMAL HIGH (ref 70–99)
Glucose-Capillary: 182 mg/dL — ABNORMAL HIGH (ref 70–99)

## 2018-06-09 LAB — RESPIRATORY PANEL BY PCR
Adenovirus: NOT DETECTED
Bordetella pertussis: NOT DETECTED
CHLAMYDOPHILA PNEUMONIAE-RVPPCR: NOT DETECTED
CORONAVIRUS HKU1-RVPPCR: NOT DETECTED
Coronavirus 229E: NOT DETECTED
Coronavirus NL63: NOT DETECTED
Coronavirus OC43: NOT DETECTED
Influenza A: NOT DETECTED
Influenza B: NOT DETECTED
Metapneumovirus: NOT DETECTED
Mycoplasma pneumoniae: NOT DETECTED
PARAINFLUENZA VIRUS 3-RVPPCR: NOT DETECTED
Parainfluenza Virus 1: NOT DETECTED
Parainfluenza Virus 2: NOT DETECTED
Parainfluenza Virus 4: NOT DETECTED
Respiratory Syncytial Virus: NOT DETECTED
Rhinovirus / Enterovirus: NOT DETECTED

## 2018-06-09 LAB — SURGICAL PCR SCREEN
MRSA, PCR: NEGATIVE
Staphylococcus aureus: NEGATIVE

## 2018-06-09 LAB — COMPREHENSIVE METABOLIC PANEL
ALT: 12 U/L (ref 0–44)
AST: 15 U/L (ref 15–41)
Albumin: 2.2 g/dL — ABNORMAL LOW (ref 3.5–5.0)
Alkaline Phosphatase: 54 U/L (ref 38–126)
Anion gap: 9 (ref 5–15)
BUN: 19 mg/dL (ref 6–20)
CO2: 22 mmol/L (ref 22–32)
Calcium: 8.4 mg/dL — ABNORMAL LOW (ref 8.9–10.3)
Chloride: 107 mmol/L (ref 98–111)
Creatinine, Ser: 0.84 mg/dL (ref 0.61–1.24)
GFR calc Af Amer: >60 mL/min (ref >60)
GFR calc non Af Amer: >60 mL/min (ref >60)
Glucose, Bld: 131 mg/dL — ABNORMAL HIGH (ref 70–99)
Potassium: 4 mmol/L (ref 3.5–5.1)
Sodium: 138 mmol/L (ref 135–145)
Total Bilirubin: 0.4 mg/dL (ref 0.3–1.2)
Total Protein: 5.2 g/dL — ABNORMAL LOW (ref 6.5–8.1)

## 2018-06-09 LAB — PROTIME-INR
INR: 1.26
Prothrombin Time: 15.6 seconds — ABNORMAL HIGH (ref 11.4–15.2)

## 2018-06-09 LAB — LEGIONELLA PNEUMOPHILA SEROGP 1 UR AG: L. pneumophila Serogp 1 Ur Ag: NEGATIVE

## 2018-06-09 LAB — APTT
aPTT: 89 seconds — ABNORMAL HIGH (ref 24–36)
aPTT: 82 seconds — ABNORMAL HIGH (ref 24–36)

## 2018-06-09 LAB — ANTINUCLEAR ANTIBODIES, IFA: ANTINUCLEAR ANTIBODIES, IFA: NEGATIVE

## 2018-06-09 LAB — TSH: TSH: 0.18 u[IU]/mL — ABNORMAL LOW (ref 0.350–4.500)

## 2018-06-09 LAB — GLOMERULAR BASEMENT MEMBRANE ANTIBODIES: GBM Ab: 5 units (ref 0–20)

## 2018-06-09 LAB — PREPARE RBC (CROSSMATCH)

## 2018-06-09 LAB — HEPARIN LEVEL (UNFRACTIONATED)
Heparin Unfractionated: 0.67 IU/mL (ref 0.30–0.70)
Heparin Unfractionated: 0.82 IU/mL — ABNORMAL HIGH (ref 0.30–0.70)

## 2018-06-09 MED ORDER — HEPARIN (PORCINE) 25000 UT/250ML-% IV SOLN
1100.0000 [IU]/h | INTRAVENOUS | Status: DC
  Administered 2018-06-09: 1100 [IU]/h via INTRAVENOUS
  Filled 2018-06-09: qty 250

## 2018-06-09 MED ORDER — CEFAZOLIN SODIUM-DEXTROSE 2-4 GM/100ML-% IV SOLN
2.0000 g | INTRAVENOUS | Status: AC
  Administered 2018-06-10: 2 g via INTRAVENOUS
  Filled 2018-06-09: qty 100

## 2018-06-09 NOTE — Evaluation (Signed)
Clinical/Bedside Swallow Evaluation Patient Details  Name: Caleb Harper MRN: [id number] Date of Birth: [birth year]  Today's Date: 06/09/2018 Time: SLP Start Time (ACUTE ONLY): 1330 SLP Stop Time (ACUTE ONLY): 1355 SLP Time Calculation (min) (ACUTE ONLY): 25 min  Past Medical History:  Past Medical History:  Diagnosis Date  . Chronic back pain   . Emphysema lung ([place])   . Tobacco abuse    Past Surgical History:  Past Surgical History:  Procedure Laterality Date  . BACK SURGERY    . VIDEO BRONCHOSCOPY Bilateral 05/22/2018   Procedure: VIDEO BRONCHOSCOPY WITH FLUORO;  Surgeon: Marshell Garfinkel, MD;  Location: [facility];  Service: Cardiopulmonary;  Laterality: Bilateral;   HPI:  58 year old male admitted 05/29/2018 with worsening dyspnea and increasing O2 requirement. PMH: COPD, emphysema. CXR = emphysema, stable parenchymal disease L>R, suggestive of PNA.    Assessment / Plan / Recommendation Clinical Impression  Pt known to ST services, having had MBS May 16, 2018. This study revealed SILENT ASPIRATION of thin liquids. Recommended diet was for regular solids and nectar thick liquids. Pt reports he was adhering to recommended diet at home. Pt exhibits intermittent cough even before po trials, and was quite short of breath throughout this evaluation. O2 sats remained 88-90 while SLP present.   Pt is to undergo a procedure tomorrow, with subsequent transfer to ICU.  Will change diet to regular solids and nectar thick liquids per MBS recommendations. Silent aspiration cannot be determined at bedside, and nectar thick liquids were tolerated well per study. Following procedure, once pt is able to take po again, SLP will re-evaluate pt and determine if repeat MBS is warranted. Pt agreeable to this plan. RN and MD informed as well. Safe swallow precautions posted at [facility].    SLP Visit Diagnosis: Dysphagia, pharyngeal phase (R13.13)(per MBS 05/16/2018)    Aspiration Risk  Moderate aspiration  risk    Diet Recommendation Regular;Nectar-thick liquid   Liquid Administration via: Cup;Straw Medication Administration: (whole meds with nectar thick liquids) Supervision: Patient able to self feed Compensations: Slow rate;Small sips/bites;Minimize environmental distractions Postural Changes: Seated upright at 90 degrees;Remain upright for at least 30 minutes after po intake    Other  Recommendations Oral Care Recommendations: Oral care QID   Follow up Recommendations (TBD)      Frequency and Duration min 2x/week  2 weeks;1 week       Prognosis Prognosis for Safe Diet Advancement: Good      Swallow Study   General Date of Onset: 06/12/2018 HPI: 59 year old male admitted 06/11/2018 with worsening dyspnea and increasing O2 requirement. PMH: COPD, emphysema. CXR = emphysema, stable parenchymal disease L>R, suggestive of PNA.  Type of Study: Bedside Swallow Evaluation Previous Swallow Assessment: MBS 05/16/2018 - trace silent aspiration of thin liquids. rec reg/NTL. BaSw = mild tertiary contractions/presbyesophagus, no significant dysmotility, no stricture or mass lesion. Diet Prior to this Study: Regular;Thin liquids Temperature Spikes Noted: No Respiratory Status: Nasal cannula History of Recent Intubation: No Behavior/Cognition: Alert;Pleasant mood;Cooperative Oral Cavity Assessment: Within Functional Limits Oral Care Completed by SLP: No Oral Cavity - Dentition: Adequate natural dentition Vision: Functional for self-feeding Self-Feeding Abilities: Able to feed self Patient Positioning: Upright in bed Baseline Vocal Quality: Normal Volitional Cough: Strong Volitional Swallow: Able to elicit    Oral/Motor/Sensory Function Overall Oral Motor/Sensory Function: Within functional limits   Ice Chips Ice chips: Not tested   Thin Liquid Thin Liquid: Not tested(history of silent aspiration on thin liquids)    Nectar  Thick Nectar Thick Liquid: Within functional  limits Presentation: Straw   Honey Thick Honey Thick Liquid: Not tested   Puree Puree: Within functional limits Presentation: Spoon   Solid     Solid: Within functional limits Presentation: [place] [facility], [place] Speech Language Pathologist [phone]  Caleb Harper 06/09/2018,2:02 PM

## 2018-06-09 NOTE — Progress Notes (Signed)
PROGRESS NOTE    Caleb Harper  BSW:[id number] DOB: [birth year] DOA: 05/30/2018 PCP: [name], L.Marlou Sa, MD     Brief Narrative:  TAHJI Juneau is a 58 y.o. male with history h/o COPD/emphysema who quit smoking 3 months back and well-known to pulmonary clinic presents with worsening dyspnea and increasing oxygen requirement.  Patient reports being admitted to this [facility] in November for respiratory issues, diagnosed with subacute versus chronic PE/pulmonary infarct and was discharged with home O2 2 L nasal cannula and Xarelto.  He states sometime after Christmas he started noticing recurrence of exertional dyspnea which progressively worsened over the last month.  He was evaluated by his primary pulmonologist Dr.Mannam last week and advised to increase O2 to 4 L.  Patient states over the last couple of days he increased O2 to 6 L as he noticed worsening dyspnea with minimal exertion.  He does have a home pulse oximetry and reports intermittent O2 sat drops to 86% on 6 L.  He also reports 15 to 20 pound weight loss since October.  Denies any hemoptysis, hematochezia or melena.  This morning he felt extremely short of breath and summoned EMS.  On their arrival, patient was alert, oriented but noted to be in SVT.  He received 1 dose of adenosine after which he was felt to have wide-complex tachycardia and received 150 mg bolus amiodarone followed by amnio drip and route. Patient converted to sinus rhythm upon arrival here and amio drip was discontinued.  Patient evaluated by cardiology.   New events last 24 hours / Subjective: Breathing much more stable this morning, on [place] O2, eating breakfast.   Assessment & Plan:   Principal Problem:   Acute respiratory failure (HCC) Active Problems:   Interstitial lung disease (HCC)   Acute on chronic hypoxic respiratory failure secondary to progressive ILD -Appreciate PCCM, note plan for surgical lung biopsy by cardiothoracic surgery -Respiratory viral  panel pending, strep pneumo Ag negative and legionella Ag pending -Procalcitonin <0.10  -Continue supplemental oxygen, on HFNC today  -High-dose IV Solu-Medrol  PSVT/AV nodal reentrant tachycardia: Secondary to problem #1 likely -Appreciate cardiology consultation -Would not give amiodarone due to his extensive pulmonary history -Started on metoprolol -Sinus bradycardia this morning   Recent history of PE -Hold Xarelto in setting of pursuing surgical lung biopsy, IV heparin ordered   Chronic systolic heart failure -Continue Entresto  Leukocytosis -On steroids, monitor   Acute urinary retention -Started on flomax. Foley placed.    DVT prophylaxis: IV heparin Code Status: Full code Family Communication: No family at bedside  Disposition Plan: pending surgical lung biopsy Tuesday    Consultants:   PCCM  Cardiology  Cardiothoracic surgery   Procedures:   None  Antimicrobials:  Anti-infectives (From admission, onward)   Start     Dose/Rate Route Frequency Ordered Stop   06/19/2018 1045  ceFAZolin (ANCEF) IVPB 2g/100 mL premix     2 g 200 mL/hr over 30 Minutes Intravenous 30 min pre-op 06/09/18 0944     06/07/18 1400  ceFEPIme (MAXIPIME) 2 g in sodium chloride 0.9 % 100 mL IVPB  Status:  Discontinued     2 g 200 mL/hr over 30 Minutes Intravenous Every 8 hours 06/07/18 1033 06/07/18 1639   06/07/18 1300  vancomycin (VANCOCIN) IVPB 1000 mg/200 mL premix  Status:  Discontinued     1,000 mg 200 mL/hr over 60 Minutes Intravenous Every 24 hours 05/26/2018 1209 06/07/18 1639   05/31/2018 2200  ceFEPIme (MAXIPIME) 1 g  in sodium chloride 0.9 % 100 mL IVPB  Status:  Discontinued     1 g 200 mL/hr over 30 Minutes Intravenous Every 8 hours 06/13/2018 1209 06/07/18 1033   06/03/2018 1130  vancomycin (VANCOCIN) IVPB 1000 mg/200 mL premix     1,000 mg 200 mL/hr over 60 Minutes Intravenous  Once 06/02/2018 1123 05/29/2018 1253   05/29/2018 1130  ceFEPIme (MAXIPIME) 2 g in sodium chloride 0.9 %  100 mL IVPB     2 g 200 mL/hr over 30 Minutes Intravenous  Once 06/14/2018 1123 06/01/2018 1228       Objective: Vitals:   06/09/18 0700 06/09/18 0824 06/09/18 0855 06/09/18 0900  BP: 107/69     Pulse: (!) 44 (!) 57 64   Resp: 14 19 18    Temp: (!) 97.5 F (36.4 C)     TempSrc: Oral     SpO2: 98% 96% 95% 96%  Weight:      Height:        Intake/Output Summary (Last 24 hours) at 06/09/2018 1053 Last data filed at 06/09/2018 0700 Gross per 24 hour  Intake -  Output 1200 ml  Net -1200 ml   Filed Weights   06/09/2018 1012 06/02/2018 1817  Weight: 53.5 kg 52.9 kg    Examination: General exam: Appears calm and comfortable  Respiratory system: Crackles diffusely  Cardiovascular system: S1 & S2 heard, Bradycardic, regular rhythm. No JVD, murmurs, rubs, gallops or clicks. No pedal edema. Gastrointestinal system: Abdomen is nondistended, soft and nontender. No organomegaly or masses felt. Normal bowel sounds heard. Central nervous system: Alert and oriented. No focal neurological deficits. Extremities: Symmetric 5 x 5 power. Skin: No rashes, lesions or ulcers Psychiatry: Judgement and insight appear normal. Mood & affect appropriate.    Data Reviewed: I have personally reviewed following labs and imaging studies  CBC: Recent Labs  Lab 06/17/2018 1020 06/09/2018 1428 06/07/18 0302 06/08/18 0222 06/09/18 0259  WBC 13.7*  --  14.1* 17.7* 22.5*  NEUTROABS 10.8*  --   --   --   --   HGB 11.7* 11.6* 12.6* 11.6* 10.9*  HCT 36.4* 34.0* 38.2* 35.6* 33.1*  MCV 92.9  --  88.8 89.7 91.2  PLT 257  --  280 269 329   Basic Metabolic Panel: Recent Labs  Lab 06/11/2018 1020 06/16/2018 1428 05/29/2018 2314 06/07/18 0302 06/08/18 0222 06/09/18 0259  NA 139 138 138 140 137 138  K 3.6 3.7 3.5 3.9 3.7 4.0  CL 107  --  107 105 108 107  CO2 23  --  20* 23 23 22   GLUCOSE 93  --  109* 110* 141* 131*  BUN 11  --  9 7 15 19   CREATININE 1.00  --  0.91 0.88 0.90 0.84  CALCIUM 8.4*  --  8.4* 8.8* 8.4*  8.4*  MG 1.8  --  1.7  --   --   --    GFR: Estimated Creatinine Clearance: 72.6 mL/min (by C-G formula based on SCr of 0.84 mg/dL). Liver Function Tests: Recent Labs  Lab 06/02/2018 1020 06/09/18 0259  AST 14* 15  ALT 10 12  ALKPHOS 67 54  BILITOT 0.8 0.4  PROT 5.6* 5.2*  ALBUMIN 2.5* 2.2*   No results for input(s): LIPASE, AMYLASE in the last 168 hours. No results for input(s): AMMONIA in the last 168 hours. Coagulation Profile: Recent Labs  Lab 05/24/2018 1020 06/09/18 0259  INR 1.18 1.26   Cardiac Enzymes: Recent Labs  Lab 06/08/2018 1020  TROPONINI <0.03   BNP (last 3 results) No results for input(s): PROBNP in the last 8760 hours. HbA1C: No results for input(s): HGBA1C in the last 72 hours. CBG: Recent Labs  Lab 06/08/18 1730 06/08/18 1944 06/08/18 2351 06/09/18 0411 06/09/18 0742  GLUCAP 152* 117* 158* 124* 126*   Lipid Profile: No results for input(s): CHOL, HDL, LDLCALC, TRIG, CHOLHDL, LDLDIRECT in the last 72 hours. Thyroid Function Tests: Recent Labs    06/09/18 0259 06/09/18 0806  TSH 0.180*  --   FREET4  --  1.01   Anemia Panel: No results for input(s): VITAMINB12, FOLATE, FERRITIN, TIBC, IRON, RETICCTPCT in the last 72 hours. Sepsis Labs: Recent Labs  Lab 06/13/2018 1020 06/07/2018 1943 05/25/2018 2311 06/07/18 0302 06/08/18 0222  PROCALCITON  --  <0.10  --  <0.10 <0.10  LATICACIDVEN 2.0* 2.5* 1.3  --   --     Recent Results (from the past 240 hour(s))  Blood Culture (routine x 2)     Status: None (Preliminary result)   Collection Time: 06/19/2018 10:50 AM  Result Value Ref Range Status   Specimen Description BLOOD RIGHT HAND  Final   Special Requests   Final    BOTTLES DRAWN AEROBIC AND ANAEROBIC Blood Culture results may not be optimal due to an inadequate volume of blood received in culture bottles   Culture   Final    NO GROWTH 2 DAYS Performed at [facility] [address] [place], [place] [address]    Report Status  PENDING  Incomplete  Blood Culture (routine x 2)     Status: None (Preliminary result)   Collection Time: 05/29/2018 11:02 AM  Result Value Ref Range Status   Specimen Description BLOOD RIGHT ANTECUBITAL  Final   Special Requests   Final    BOTTLES DRAWN AEROBIC AND ANAEROBIC Blood Culture adequate volume   Culture   Final    NO GROWTH 2 DAYS Performed at [facility], [place] [address], [place], [place] [address]    Report Status PENDING  Incomplete  MRSA PCR Screening     Status: None   Collection Time: 06/07/2018  6:23 PM  Result Value Ref Range Status   MRSA by PCR NEGATIVE NEGATIVE Final    Comment:        The GeneXpert MRSA Assay (FDA approved for NASAL specimens only), is one component of a comprehensive MRSA colonization surveillance program. It is not intended to diagnose MRSA infection nor to guide or monitor treatment for MRSA infections. Performed at [facility], [place] [address], [place], [place] [address]        Radiology Studies: Dg Chest Port 1 View  Result Date: 06/09/2018 CLINICAL DATA:  Acute hypoxic respiratory failure. EXAM: PORTABLE CHEST 1 VIEW COMPARISON:  05/25/2018 FINDINGS: No significant change in the parenchymal densities in the mid and lower left lung. Persistent interstitial densities along the right lower lung and periphery of the right lower chest. Cannot exclude a small right pleural effusion. Heart size is grossly stable. Underlying emphysema. Trachea is midline. IMPRESSION: Emphysema with stable parenchymal disease in both lungs, left side greater than right. Findings are suggestive for pneumonia. No significant change from the recent comparison examination. Possible small right pleural effusion. Electronically Signed   By: Markus Daft M.D.   On: 06/09/2018 08:25      Scheduled Meds: . fluticasone furoate-vilanterol  1 puff Inhalation Daily  . insulin aspart  2-6 Units Subcutaneous TID WC  .  ipratropium-albuterol  3 mL Nebulization TID   . lidocaine  1 application Urethral Once  . metoprolol succinate  25 mg Oral Daily  . pantoprazole  40 mg Oral Daily  . sacubitril-valsartan  1 tablet Oral BID  . tamsulosin  0.4 mg Oral Daily  . umeclidinium bromide  1 puff Inhalation Daily   Continuous Infusions: . [START ON 06/05/2018]  ceFAZolin (ANCEF) IV    . heparin 1,100 Units/hr (06/09/18 1020)  . methylPREDNISolone (SOLU-MEDROL) injection 250 mg (06/09/18 0827)     LOS: 3 days    Time spent: 20 minutes   Dessa Phi, DO [facility] [email or web address] 06/09/2018, 10:53 AM

## 2018-06-09 NOTE — Progress Notes (Signed)
06/09/2018 Patient refuse respiratory panel by pcr. [facility] RN.

## 2018-06-09 NOTE — Progress Notes (Signed)
[place] for Heparin (Xarelto on hold) Indication: History of  pulmonary embolus  Allergies  Allergen Reactions  . Chantix [Varenicline Tartrate] Nausea And Vomiting    Patient Measurements: Height: 5\' 9"  (175.3 cm) Weight: 116 lb 10 oz (52.9 kg) IBW/kg (Calculated) : 70.7 Heparin Dosing Weight: 53 kg  Vital Signs: Temp: 98 F (36.7 C) (02/17 0300) Temp Source: Oral (02/17 0300) BP: 102/63 (02/17 0300) Pulse Rate: 58 (02/17 0300)  Labs: Recent Labs    05/27/2018 1020  06/07/18 0302  06/07/18 1048 06/08/18 0222 06/08/18 1207 06/08/18 1940 06/09/18 0259  HGB 11.7*   < > 12.6*  --   --  11.6*  --   --  10.9*  HCT 36.4*   < > 38.2*  --   --  35.6*  --   --  33.1*  PLT 257  --  280  --   --  269  --   --  293  APTT  --   --   --    < > 36 49* 69* 60* 89*  LABPROT 14.9  --   --   --   --   --   --   --  15.6*  INR 1.18  --   --   --   --   --   --   --  1.26  HEPARINUNFRC  --   --   --   --  >2.20*  --   --   --  0.67  CREATININE 1.00   < > 0.88  --   --  0.90  --   --  0.84  TROPONINI <0.03  --   --   --   --   --   --   --   --    < > = values in this interval not displayed.    Estimated Creatinine Clearance: 72.6 mL/min (by C-G formula based on SCr of 0.84 mg/dL).  Assessment: Pt is here for his ILD and PNA. He has been on xarelto for his PE. His last dose was last PM at 1900. Plan is to transition to IV heparin due to the need to possible bx. We will get baseline heparin level and PTT for correlation>>baseline heparin level is elevated at >2.2 due to Xarelto use.  APTT and heparin level are therapeutic.   Goal of Therapy:  Heparin level 0.3-0.7 units/ml  APTT 66-102 Monitor platelets by anticoagulation protocol: Yes   Plan:  Continue heparin gtt 1100 units/hr Confirm dosing with 6 hr heparin level and aPTT  Salome Arnt, PharmD, BCPS Please see AMION for all pharmacy numbers 06/09/2018 4:05 AM

## 2018-06-09 NOTE — Progress Notes (Signed)
[place] for Heparin (Xarelto on hold) Indication: History of  pulmonary embolus  Allergies  Allergen Reactions  . Chantix [Varenicline Tartrate] Nausea And Vomiting    Patient Measurements: Height: 5\' 9"  (175.3 cm) Weight: 116 lb 10 oz (52.9 kg) IBW/kg (Calculated) : 70.7 Heparin Dosing Weight: 53 kg  Vital Signs: Temp: 97.5 F (36.4 C) (02/17 0700) Temp Source: Oral (02/17 0700) BP: 107/69 (02/17 0700) Pulse Rate: 64 (02/17 0855)  Labs: Recent Labs    06/01/2018 1020  06/07/18 0302  06/07/18 1048 06/08/18 0222  06/08/18 1940 06/09/18 0259 06/09/18 0806  HGB 11.7*   < > 12.6*  --   --  11.6*  --   --  10.9*  --   HCT 36.4*   < > 38.2*  --   --  35.6*  --   --  33.1*  --   PLT 257  --  280  --   --  269  --   --  293  --   APTT  --   --   --    < > 36 49*   < > 60* 89* 82*  LABPROT 14.9  --   --   --   --   --   --   --  15.6*  --   INR 1.18  --   --   --   --   --   --   --  1.26  --   HEPARINUNFRC  --   --   --   --  >2.20*  --   --   --  0.67 0.82*  CREATININE 1.00   < > 0.88  --   --  0.90  --   --  0.84  --   TROPONINI <0.03  --   --   --   --   --   --   --   --   --    < > = values in this interval not displayed.    Estimated Creatinine Clearance: 72.6 mL/min (by C-G formula based on SCr of 0.84 mg/dL).  Assessment: Pt is here for his ILD and PNA. He has been on xarelto for his PE. His last dose was last PM at 1900. Plan is to transition to IV heparin due to the need to possible bx. We will get baseline heparin level and PTT for correlation>>baseline heparin level is elevated at >2.2 due to Xarelto use.  APTT = 82 seconds, Heparin level is 0.82.  PTT remains therapeutic on heparin drip 1100 units/hr.  Last Xarelto dose taken on 2/14 PM.  No bleeding reported.  Goal of Therapy:  Heparin level 0.3-0.7 units/ml  APTT 66-102 Monitor platelets by anticoagulation protocol: Yes   Plan:  Continue heparin gtt 1100  units/hr Daily aPTT, heparin level and CBC.  Heparin to be turned off 07:44 tomorrow 2/18 for lung biopsy procedure on 2/18.   Nicole Cella, RPh Please see AMION for all pharmacy numbers 06/09/2018 9:59 AM

## 2018-06-09 NOTE — Progress Notes (Addendum)
NAME:  Caleb Harper, MRN:  [id number], DOB:  [birth year], LOS: 3 ADMISSION DATE:  06/12/2018, CONSULTATION DATE:  05/27/2018 REFERRING MD:  Dr. Sabra Heck, CHIEF COMPLAINT:  Shortness of breath   Brief History    Caleb Harper is  -58 year old male whose father died from pulmonary fibrosis 2014/2015.  He is a former smoker.  He works as an Clinical biochemist.  He is well-known to our service and sees Dr. Vaughan Browner.  History dates from October 2019 when he started developing insidious onset of shortness of breath that has been progressive since then.  Around Thanksgiving 2019 he was diagnosed with multifocal pulmonary embolism associated with right lower lobe groundglass opacities.  Since then he has been on anticoagulation.  Despite anticoagulation he has had progressive shortness of breath and hypoxemia.  On May 22, 2018 underwent bronchoscopy with lavage that showed neutrophilia with volume of 61% [normal is macrophages greater than 90%] and without lymphocytosis or eosinophilia.  A transbronchial biopsy at this time was nondiagnostic.  It appears at this time he might have been started on prednisone empiric which he finished 1 week ago.  Despite this he said progressive shortness of breath.  But because of continued shortness of breath and persistent infiltrates especially on CT scan of the chest May 12, 2018 with bilateral lower lobe consolidation a surgical lung biopsy was recommended.  He has upcoming appointment Dr. Roxan Hockey June 18, 2018.  However in the interim he has decompensated particularly in the last few weeks and now requiring even 6 L of oxygen at rest.  A few weeks ago he was only on 3 L oxygen.  At no point in time he has had fever or rash or joint issues.  Patient is quite upset that the rate of progression.  He feels some slowness in elucidating a diagnosis.  ILD specific hx -  reports that he quit smoking about 3 months ago. His smoking use included cigarettes. He has never used  smokeless tobacco.  - emphysema on CT  - Dad died at [facility] in 06/28/12 from pulmonary fibrosis  - ANA,RF, CC - negative 06/28/2018 - BAL -neutrophilia Jan 2020  -works as Clinical biochemist , denies mold expsure  Past Medical History  H/o PE (November, on anticoagulation with xarelto) Emphysema Tobacco use  [name]   2/14 - Admitted to [facility], cardiology and CCM consulted ./  Started high dose IV steroids - 1g/day 2/15 -  Feels "tad better" better. Down to 6L HFNC. EF improved in echo at admit . Concerns for VSD in echo but cards does not think so clinically. On IV heparin in case in a few days he needs to have surgical lung biopsy. PCT noral RVP pending  Consults:  PCCM Cardiology   Procedures:  None   Significant Diagnostic Tests:  Repeat tests 2/14- echo 65%, Possible VSD but cards doubts clinically 2/14 - urine strep - neg 2/14 -SCL 70 - neg -SSA/SSB - neg -ANA - neg - RF - 15.5 -DS DNA - neg -MPO- neg -PR3neg -anti GBM  - ESR 42 Echo bubble study 2/16 > no evidence of shunt.  Micro Data:  RVP (2/14) >  Blood cx (2/14) >   Antimicrobials:  Cefepime 2/14 > 2/15 Vancomycin 2/14 > 2/15   SUBJECTIVE/OVERNIGHT/INTERVAL HX  Up to 10L HFNC with SpO2 96%. Had episode of desaturation overnight while eating.  He feels as if he is aspirating.  Objective   Blood pressure 107/69, pulse 64, temperature (!)  97.5 F (36.4 C), temperature source Oral, resp. rate 18, height _0  (1.753 m), weight 52.9 kg, SpO2 96 %.    FiO2 (%):  [40 %] 40 %   Intake/Output Summary (Last 24 hours) at 06/09/2018 1000 Last data filed at 06/09/2018 0700 Gross per 24 hour  Intake -  Output 1200 ml  Net -1200 ml   Filed Weights   05/24/2018 1012 06/03/2018 1817  Weight: 53.5 kg 52.9 kg   Physical Exam:  General: Adult male, chronically ill appearing, resting in bed, in NAD. Neuro: A&O x 3, no deficits. HEENT: [place]/AT. Sclerae anicteric. HFNC in  place. Cardiovascular: RRR, no M/R/G.  Lungs: Respirations even and unlabored.  Coarse crackles in bases. Abdomen: BS x 4, soft, NT/ND.  Musculoskeletal: No gross deformities, no edema.  Skin: Intact, warm, no rashes.   Assessment & Plan:   Acute on chronic progressive hypoxemic resp failure - now with possible superimposed HCAP. - Continue supplemental O2 as needed to maintain SpO2 > 92%. - Abx d/c'd 2/15. - Follow CXR.  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 - Surgical lung biopsy planned for 05/24/2018 by Dr. VPrescott Gum  Hx PE (on xarelto). - Continue heparin gtt in lieu of preadmission xarelto.  ? Aspiration. - Would repeat SLP eval (last one 05/16/18).   Rest per primary team.   RMontey Hora PA - [facility] Pager: [phone]  If no answer, [phone] - [phone]/17/2020, 11:50 AM

## 2018-06-10 ENCOUNTER — Inpatient Hospital Stay (HOSPITAL_COMMUNITY): Payer: BLUE CROSS/BLUE SHIELD | Admitting: Certified Registered"

## 2018-06-10 ENCOUNTER — Encounter (HOSPITAL_COMMUNITY): Admission: EM | Disposition: E | Payer: Self-pay | Source: Home / Self Care | Attending: Pulmonary Disease

## 2018-06-10 ENCOUNTER — Inpatient Hospital Stay (HOSPITAL_COMMUNITY): Payer: BLUE CROSS/BLUE SHIELD

## 2018-06-10 DIAGNOSIS — I2699 Other pulmonary embolism without acute cor pulmonale: Secondary | ICD-10-CM

## 2018-06-10 DIAGNOSIS — R0603 Acute respiratory distress: Secondary | ICD-10-CM

## 2018-06-10 DIAGNOSIS — C3432 Malignant neoplasm of lower lobe, left bronchus or lung: Secondary | ICD-10-CM | POA: Diagnosis not present

## 2018-06-10 DIAGNOSIS — R918 Other nonspecific abnormal finding of lung field: Secondary | ICD-10-CM

## 2018-06-10 DIAGNOSIS — J849 Interstitial pulmonary disease, unspecified: Secondary | ICD-10-CM

## 2018-06-10 DIAGNOSIS — C3412 Malignant neoplasm of upper lobe, left bronchus or lung: Secondary | ICD-10-CM | POA: Diagnosis not present

## 2018-06-10 HISTORY — PX: LUNG BIOPSY: SHX5088

## 2018-06-10 HISTORY — PX: VIDEO ASSISTED THORACOSCOPY: SHX5073

## 2018-06-10 LAB — BASIC METABOLIC PANEL
Anion gap: 8 (ref 5–15)
BUN: 17 mg/dL (ref 6–20)
CO2: 25 mmol/L (ref 22–32)
Calcium: 8.3 mg/dL — ABNORMAL LOW (ref 8.9–10.3)
Chloride: 106 mmol/L (ref 98–111)
Creatinine, Ser: 0.81 mg/dL (ref 0.61–1.24)
GFR calc Af Amer: >60 mL/min (ref >60)
GLUCOSE: 122 mg/dL — AB (ref 70–99)
Potassium: 4 mmol/L (ref 3.5–5.1)
Sodium: 139 mmol/L (ref 135–145)

## 2018-06-10 LAB — CBC
HCT: 32.4 % — ABNORMAL LOW (ref 39.0–52.0)
Hemoglobin: 10.7 g/dL — ABNORMAL LOW (ref 13.0–17.0)
MCH: 30.1 pg (ref 26.0–34.0)
MCHC: 33 g/dL (ref 30.0–36.0)
MCV: 91.3 fL (ref 80.0–100.0)
PLATELETS: 286 10*3/uL (ref 150–400)
RBC: 3.55 MIL/uL — ABNORMAL LOW (ref 4.22–5.81)
RDW: 13.5 % (ref 11.5–15.5)
WBC: 19.3 10*3/uL — ABNORMAL HIGH (ref 4.0–10.5)
nRBC: 0 % (ref 0.0–0.2)

## 2018-06-10 LAB — POCT I-STAT 7, (LYTES, BLD GAS, ICA,H+H)
Acid-Base Excess: 2 mmol/L (ref 0.0–2.0)
Bicarbonate: 25.9 mmol/L (ref 20.0–28.0)
Calcium, Ion: 1.22 mmol/L (ref 1.15–1.40)
HCT: 31 % — ABNORMAL LOW (ref 39.0–52.0)
Hemoglobin: 10.5 g/dL — ABNORMAL LOW (ref 13.0–17.0)
O2 Saturation: 87 %
Patient temperature: 98.6
Potassium: 4 mmol/L (ref 3.5–5.1)
SODIUM: 138 mmol/L (ref 135–145)
TCO2: 27 mmol/L (ref 22–32)
pCO2 arterial: 38.6 mmHg (ref 32.0–48.0)
pH, Arterial: 7.435 (ref 7.350–7.450)
pO2, Arterial: 51 mmHg — ABNORMAL LOW (ref 83.0–108.0)

## 2018-06-10 LAB — APTT: aPTT: 79 seconds — ABNORMAL HIGH (ref 24–36)

## 2018-06-10 LAB — GLUCOSE, CAPILLARY
Glucose-Capillary: 112 mg/dL — ABNORMAL HIGH (ref 70–99)
Glucose-Capillary: 102 mg/dL — ABNORMAL HIGH (ref 70–99)
Glucose-Capillary: 103 mg/dL — ABNORMAL HIGH (ref 70–99)

## 2018-06-10 LAB — HEPARIN LEVEL (UNFRACTIONATED): Heparin Unfractionated: 0.65 IU/mL (ref 0.30–0.70)

## 2018-06-10 SURGERY — VIDEO ASSISTED THORACOSCOPY
Anesthesia: General | Site: Chest | Laterality: Left

## 2018-06-10 MED ORDER — PROPOFOL 10 MG/ML IV BOLUS
INTRAVENOUS | Status: DC | PRN
  Administered 2018-06-10: 120 mg via INTRAVENOUS

## 2018-06-10 MED ORDER — POTASSIUM CHLORIDE 10 MEQ/50ML IV SOLN: 10.0000 meq | Freq: Every day | INTRAVENOUS | Status: DC | PRN

## 2018-06-10 MED ORDER — KETOROLAC TROMETHAMINE 30 MG/ML IJ SOLN
INTRAMUSCULAR | Status: DC | PRN
  Administered 2018-06-10: 15 mg via INTRAVENOUS

## 2018-06-10 MED ORDER — ORAL CARE MOUTH RINSE
15.0000 mL | Freq: Two times a day (BID) | OROMUCOSAL | Status: DC
  Administered 2018-06-10 – 2018-06-15 (×8): 15 mL via OROMUCOSAL

## 2018-06-10 MED ORDER — RESOURCE THICKENUP CLEAR PO POWD
ORAL | Status: DC | PRN
  Filled 2018-06-10: qty 125

## 2018-06-10 MED ORDER — LEVALBUTEROL HCL 0.63 MG/3ML IN NEBU
0.6300 mg | INHALATION_SOLUTION | Freq: Four times a day (QID) | RESPIRATORY_TRACT | Status: DC
  Administered 2018-06-10 – 2018-06-15 (×16): 0.63 mg via RESPIRATORY_TRACT
  Filled 2018-06-10 (×18): qty 3

## 2018-06-10 MED ORDER — DEXAMETHASONE SODIUM PHOSPHATE 10 MG/ML IJ SOLN
INTRAMUSCULAR | Status: DC | PRN
  Administered 2018-06-10: 10 mg via INTRAVENOUS

## 2018-06-10 MED ORDER — TAMSULOSIN HCL 0.4 MG PO CAPS
0.4000 mg | ORAL_CAPSULE | Freq: Every day | ORAL | Status: DC
  Administered 2018-06-11 – 2018-06-14 (×4): 0.4 mg via ORAL
  Filled 2018-06-10 (×5): qty 1

## 2018-06-10 MED ORDER — ACETAMINOPHEN 500 MG PO TABS
1000.0000 mg | ORAL_TABLET | Freq: Four times a day (QID) | ORAL | Status: DC
  Administered 2018-06-10 – 2018-06-14 (×10): 1000 mg via ORAL
  Filled 2018-06-10 (×10): qty 2

## 2018-06-10 MED ORDER — DIPHENHYDRAMINE HCL 50 MG/ML IJ SOLN: 12.5000 mg | Freq: Four times a day (QID) | INTRAMUSCULAR | Status: DC | PRN

## 2018-06-10 MED ORDER — PHENYLEPHRINE 40 MCG/ML (10ML) SYRINGE FOR IV PUSH (FOR BLOOD PRESSURE SUPPORT)
PREFILLED_SYRINGE | INTRAVENOUS | Status: AC
  Filled 2018-06-10: qty 10

## 2018-06-10 MED ORDER — ROCURONIUM BROMIDE 10 MG/ML (PF) SYRINGE
PREFILLED_SYRINGE | INTRAVENOUS | Status: DC | PRN
  Administered 2018-06-10: 20 mg via INTRAVENOUS
  Administered 2018-06-10: 50 mg via INTRAVENOUS

## 2018-06-10 MED ORDER — TRAMADOL HCL 50 MG PO TABS: 50.0000 mg | ORAL_TABLET | Freq: Four times a day (QID) | ORAL | Status: DC | PRN

## 2018-06-10 MED ORDER — EPHEDRINE SULFATE-NACL 50-0.9 MG/10ML-% IV SOSY
PREFILLED_SYRINGE | INTRAVENOUS | Status: DC | PRN
  Administered 2018-06-10 (×2): 5 mg via INTRAVENOUS

## 2018-06-10 MED ORDER — NALOXONE HCL 0.4 MG/ML IJ SOLN: 0.4000 mg | INTRAMUSCULAR | Status: DC | PRN

## 2018-06-10 MED ORDER — MIDAZOLAM HCL 2 MG/2ML IJ SOLN
INTRAMUSCULAR | Status: DC | PRN
  Administered 2018-06-10: 1 mg via INTRAVENOUS

## 2018-06-10 MED ORDER — BISACODYL 5 MG PO TBEC
10.0000 mg | DELAYED_RELEASE_TABLET | Freq: Every day | ORAL | Status: DC
  Administered 2018-06-10 – 2018-06-14 (×5): 10 mg via ORAL
  Filled 2018-06-10 (×5): qty 2

## 2018-06-10 MED ORDER — 0.9 % SODIUM CHLORIDE (POUR BTL) OPTIME
TOPICAL | Status: DC | PRN
  Administered 2018-06-10: 1000 mL

## 2018-06-10 MED ORDER — KETOROLAC TROMETHAMINE 15 MG/ML IJ SOLN
15.0000 mg | Freq: Four times a day (QID) | INTRAMUSCULAR | Status: AC | PRN
  Filled 2018-06-10: qty 1

## 2018-06-10 MED ORDER — ONDANSETRON HCL 4 MG/2ML IJ SOLN
INTRAMUSCULAR | Status: AC
  Filled 2018-06-10: qty 2

## 2018-06-10 MED ORDER — DEXTROSE-NACL 5-0.45 % IV SOLN
INTRAVENOUS | Status: DC
  Administered 2018-06-10 – 2018-06-14 (×6): via INTRAVENOUS

## 2018-06-10 MED ORDER — DEXAMETHASONE SODIUM PHOSPHATE 10 MG/ML IJ SOLN
INTRAMUSCULAR | Status: AC
  Filled 2018-06-10: qty 1

## 2018-06-10 MED ORDER — ACETAMINOPHEN 500 MG PO TABS
1000.0000 mg | ORAL_TABLET | Freq: Once | ORAL | Status: AC
  Administered 2018-06-10: 1000 mg via ORAL

## 2018-06-10 MED ORDER — ACETAMINOPHEN 160 MG/5ML PO SOLN
1000.0000 mg | Freq: Four times a day (QID) | ORAL | Status: DC
  Administered 2018-06-13 – 2018-06-15 (×7): 1000 mg via ORAL
  Filled 2018-06-10 (×7): qty 40.6

## 2018-06-10 MED ORDER — CEFAZOLIN SODIUM-DEXTROSE 2-4 GM/100ML-% IV SOLN
2.0000 g | Freq: Three times a day (TID) | INTRAVENOUS | Status: AC
  Administered 2018-06-10 – 2018-06-11 (×2): 2 g via INTRAVENOUS
  Filled 2018-06-10 (×2): qty 100

## 2018-06-10 MED ORDER — EPHEDRINE 5 MG/ML INJ
INTRAVENOUS | Status: AC
  Filled 2018-06-10: qty 10

## 2018-06-10 MED ORDER — HYDROMORPHONE HCL 1 MG/ML IJ SOLN: 0.2500 mg | INTRAMUSCULAR | Status: DC | PRN

## 2018-06-10 MED ORDER — DIPHENHYDRAMINE HCL 12.5 MG/5ML PO ELIX
12.5000 mg | ORAL_SOLUTION | Freq: Four times a day (QID) | ORAL | Status: DC | PRN
  Filled 2018-06-10: qty 5

## 2018-06-10 MED ORDER — FENTANYL CITRATE (PF) 250 MCG/5ML IJ SOLN
INTRAMUSCULAR | Status: AC
  Filled 2018-06-10: qty 5

## 2018-06-10 MED ORDER — LACTATED RINGERS IV SOLN
INTRAVENOUS | Status: DC | PRN
  Administered 2018-06-10: 12:00:00 via INTRAVENOUS

## 2018-06-10 MED ORDER — FENTANYL CITRATE (PF) 250 MCG/5ML IJ SOLN
INTRAMUSCULAR | Status: DC | PRN
  Administered 2018-06-10: 50 ug via INTRAVENOUS
  Administered 2018-06-10 (×2): 25 ug via INTRAVENOUS
  Administered 2018-06-10: 50 ug via INTRAVENOUS

## 2018-06-10 MED ORDER — LACTATED RINGERS IV SOLN
INTRAVENOUS | Status: DC
  Administered 2018-06-10: 12:00:00 via INTRAVENOUS

## 2018-06-10 MED ORDER — PHENYLEPHRINE 40 MCG/ML (10ML) SYRINGE FOR IV PUSH (FOR BLOOD PRESSURE SUPPORT)
PREFILLED_SYRINGE | INTRAVENOUS | Status: DC | PRN
  Administered 2018-06-10: 120 ug via INTRAVENOUS

## 2018-06-10 MED ORDER — ACETAMINOPHEN 500 MG PO TABS
ORAL_TABLET | ORAL | Status: AC
  Administered 2018-06-10: 1000 mg via ORAL
  Filled 2018-06-10: qty 2

## 2018-06-10 MED ORDER — LIDOCAINE 2% (20 MG/ML) 5 ML SYRINGE
INTRAMUSCULAR | Status: AC
  Filled 2018-06-10: qty 5

## 2018-06-10 MED ORDER — PROPOFOL 10 MG/ML IV BOLUS
INTRAVENOUS | Status: AC
  Filled 2018-06-10: qty 20

## 2018-06-10 MED ORDER — ROCURONIUM BROMIDE 50 MG/5ML IV SOSY
PREFILLED_SYRINGE | INTRAVENOUS | Status: AC
  Filled 2018-06-10: qty 10

## 2018-06-10 MED ORDER — OXYCODONE HCL 5 MG PO TABS
5.0000 mg | ORAL_TABLET | ORAL | Status: DC | PRN
  Administered 2018-06-10 – 2018-06-12 (×5): 10 mg via ORAL
  Filled 2018-06-10 (×5): qty 2

## 2018-06-10 MED ORDER — LIDOCAINE 2% (20 MG/ML) 5 ML SYRINGE
INTRAMUSCULAR | Status: DC | PRN
  Administered 2018-06-10: 60 mg via INTRAVENOUS

## 2018-06-10 MED ORDER — CEFAZOLIN SODIUM-DEXTROSE 2-3 GM-%(50ML) IV SOLR
INTRAVENOUS | Status: DC | PRN
  Administered 2018-06-10: 2 g via INTRAVENOUS

## 2018-06-10 MED ORDER — SODIUM CHLORIDE 0.9 % IV SOLN
INTRAVENOUS | Status: DC | PRN
  Administered 2018-06-10: 40 ug/min via INTRAVENOUS

## 2018-06-10 MED ORDER — RIVAROXABAN 20 MG PO TABS
20.0000 mg | ORAL_TABLET | Freq: Every day | ORAL | Status: DC
  Administered 2018-06-11 – 2018-06-13 (×3): 20 mg via ORAL
  Filled 2018-06-10 (×3): qty 1

## 2018-06-10 MED ORDER — SUCCINYLCHOLINE CHLORIDE 200 MG/10ML IV SOSY
PREFILLED_SYRINGE | INTRAVENOUS | Status: AC
  Filled 2018-06-10: qty 10

## 2018-06-10 MED ORDER — METHYLPREDNISOLONE SODIUM SUCC 125 MG IJ SOLR
60.0000 mg | Freq: Four times a day (QID) | INTRAMUSCULAR | Status: DC
  Administered 2018-06-10 – 2018-06-14 (×15): 60 mg via INTRAVENOUS
  Filled 2018-06-10 (×16): qty 2

## 2018-06-10 MED ORDER — ONDANSETRON HCL 4 MG/2ML IJ SOLN: 4.0000 mg | Freq: Four times a day (QID) | INTRAMUSCULAR | Status: DC | PRN

## 2018-06-10 MED ORDER — KETOROLAC TROMETHAMINE 30 MG/ML IJ SOLN
INTRAMUSCULAR | Status: AC
  Filled 2018-06-10: qty 1

## 2018-06-10 MED ORDER — SODIUM CHLORIDE 0.9% FLUSH: 9.0000 mL | INTRAVENOUS | Status: DC | PRN

## 2018-06-10 MED ORDER — FENTANYL 40 MCG/ML IV SOLN
INTRAVENOUS | Status: DC
  Administered 2018-06-10: 240 ug via INTRAVENOUS
  Administered 2018-06-10: 15:00:00 via INTRAVENOUS
  Administered 2018-06-11: 60 ug via INTRAVENOUS
  Administered 2018-06-11: 120 ug via INTRAVENOUS
  Filled 2018-06-10: qty 1000

## 2018-06-10 MED ORDER — INSULIN ASPART 100 UNIT/ML ~~LOC~~ SOLN
0.0000 [IU] | SUBCUTANEOUS | Status: DC
  Administered 2018-06-11 – 2018-06-12 (×7): 2 [IU] via SUBCUTANEOUS
  Administered 2018-06-13 (×2): 4 [IU] via SUBCUTANEOUS
  Administered 2018-06-13 – 2018-06-14 (×3): 2 [IU] via SUBCUTANEOUS

## 2018-06-10 MED ORDER — SUGAMMADEX SODIUM 200 MG/2ML IV SOLN
INTRAVENOUS | Status: DC | PRN
  Administered 2018-06-10: 100 mg via INTRAVENOUS

## 2018-06-10 MED ORDER — MIDAZOLAM HCL 2 MG/2ML IJ SOLN
INTRAMUSCULAR | Status: AC
  Filled 2018-06-10: qty 2

## 2018-06-10 MED ORDER — SENNOSIDES-DOCUSATE SODIUM 8.6-50 MG PO TABS
1.0000 | ORAL_TABLET | Freq: Every day | ORAL | Status: DC
  Administered 2018-06-13 – 2018-06-14 (×2): 1 via ORAL
  Filled 2018-06-10 (×5): qty 1

## 2018-06-10 SURGICAL SUPPLY — 65 items
ADH SKN CLS APL DERMABOND .7 (GAUZE/BANDAGES/DRESSINGS) ×4
BAG DECANTER FOR FLEXI CONT (MISCELLANEOUS) IMPLANT
BLADE SURG 11 STRL SS (BLADE) ×4 IMPLANT
CANISTER SUCT 3000ML PPV (MISCELLANEOUS) ×4 IMPLANT
CATH KIT ON Q 5IN SLV (PAIN MANAGEMENT) IMPLANT
CATH ROBINSON RED A/P 22FR (CATHETERS) IMPLANT
CATH THORACIC 28FR (CATHETERS) ×2 IMPLANT
CATH THORACIC 36FR (CATHETERS) IMPLANT
CATH THORACIC 36FR RT ANG (CATHETERS) IMPLANT
CONT SPEC 4OZ CLIKSEAL STRL BL (MISCELLANEOUS) ×8 IMPLANT
COVER SURGICAL LIGHT HANDLE (MISCELLANEOUS) ×8 IMPLANT
COVER WAND RF STERILE (DRAPES) ×4 IMPLANT
DERMABOND ADVANCED (GAUZE/BANDAGES/DRESSINGS) ×4
DERMABOND ADVANCED .7 DNX12 (GAUZE/BANDAGES/DRESSINGS) ×2 IMPLANT
DRAPE LAPAROSCOPIC ABDOMINAL (DRAPES) ×4 IMPLANT
DRAPE SLUSH/WARMER DISC (DRAPES) ×4 IMPLANT
ELECT REM PT RETURN 9FT ADLT (ELECTROSURGICAL) ×4
ELECTRODE REM PT RTRN 9FT ADLT (ELECTROSURGICAL) ×2 IMPLANT
GAUZE SPONGE 4X4 12PLY STRL (GAUZE/BANDAGES/DRESSINGS) ×7 IMPLANT
GLOVE BIO SURGEON STRL SZ7.5 (GLOVE) ×8 IMPLANT
GOWN STRL REUS W/ TWL LRG LVL3 (GOWN DISPOSABLE) ×6 IMPLANT
GOWN STRL REUS W/TWL LRG LVL3 (GOWN DISPOSABLE) ×12
KIT BASIN OR (CUSTOM PROCEDURE TRAY) ×4 IMPLANT
KIT SUCTION CATH 14FR (SUCTIONS) ×4 IMPLANT
KIT TURNOVER KIT B (KITS) ×4 IMPLANT
NS IRRIG 1000ML POUR BTL (IV SOLUTION) ×8 IMPLANT
PACK CHEST (CUSTOM PROCEDURE TRAY) ×4 IMPLANT
PAD ARMBOARD 7.5X6 YLW CONV (MISCELLANEOUS) ×8 IMPLANT
RELOAD STAPLE 60 3.8 GOLD REG (STAPLE) IMPLANT
RELOAD STAPLER GOLD 60MM (STAPLE) ×4 IMPLANT
SEALANT SURG COSEAL 4ML (VASCULAR PRODUCTS) IMPLANT
SOLUTION ANTI FOG 6CC (MISCELLANEOUS) ×6 IMPLANT
SPONGE TONSIL TAPE 1 RFD (DISPOSABLE) ×4 IMPLANT
STAPLER ECHELON POWERED (MISCELLANEOUS) ×2 IMPLANT
STAPLER RELOAD GOLD 60MM (STAPLE) ×8
SUT CHROMIC 3 0 SH 27 (SUTURE) IMPLANT
SUT ETHILON 3 0 PS 1 (SUTURE) IMPLANT
SUT PROLENE 3 0 SH DA (SUTURE) IMPLANT
SUT PROLENE 4 0 RB 1 (SUTURE)
SUT PROLENE 4-0 RB1 .5 CRCL 36 (SUTURE) IMPLANT
SUT SILK  1 MH (SUTURE) ×2
SUT SILK 1 MH (SUTURE) ×3 IMPLANT
SUT SILK 2 0SH CR/8 30 (SUTURE) IMPLANT
SUT SILK 3 0SH CR/8 30 (SUTURE) IMPLANT
SUT VIC AB 1 CTX 18 (SUTURE) ×3 IMPLANT
SUT VIC AB 2 TP1 27 (SUTURE) IMPLANT
SUT VIC AB 2-0 CT2 18 VCP726D (SUTURE) IMPLANT
SUT VIC AB 2-0 CTX 36 (SUTURE) IMPLANT
SUT VIC AB 3-0 SH 18 (SUTURE) ×3 IMPLANT
SUT VIC AB 3-0 X1 27 (SUTURE) ×4 IMPLANT
SUT VICRYL 0 UR6 27IN ABS (SUTURE) ×6 IMPLANT
SUT VICRYL 2 TP 1 (SUTURE) IMPLANT
SWAB COLLECTION DEVICE MRSA (MISCELLANEOUS) IMPLANT
SWAB CULTURE ESWAB REG 1ML (MISCELLANEOUS) IMPLANT
SYSTEM SAHARA CHEST DRAIN ATS (WOUND CARE) ×4 IMPLANT
TAPE CLOTH SURG 4X10 WHT LF (GAUZE/BANDAGES/DRESSINGS) ×3 IMPLANT
TIP APPLICATOR SPRAY EXTEND 16 (VASCULAR PRODUCTS) IMPLANT
TOWEL GREEN STERILE (TOWEL DISPOSABLE) ×4 IMPLANT
TOWEL GREEN STERILE FF (TOWEL DISPOSABLE) ×4 IMPLANT
TRAP SPECIMEN MUCOUS 40CC (MISCELLANEOUS) IMPLANT
TRAY FOLEY MTR SLVR 16FR STAT (SET/KITS/TRAYS/PACK) ×1 IMPLANT
TROCAR BLADELESS 5MM (ENDOMECHANICALS) IMPLANT
TROCAR XCEL NON-BLD 5MMX100MML (ENDOMECHANICALS) ×3 IMPLANT
TUNNELER SHEATH ON-Q 11GX8 DSP (PAIN MANAGEMENT) IMPLANT
WATER STERILE IRR 1000ML POUR (IV SOLUTION) ×8 IMPLANT

## 2018-06-10 NOTE — Progress Notes (Addendum)
NAME:  Caleb Harper, MRN:  [id number], DOB:  23-May-1960, LOS: 4 ADMISSION DATE:  06/11/2018, CONSULTATION DATE:  06/12/2018 REFERRING MD:  Caleb Harper, CHIEF COMPLAINT:  Shortness of breath   Brief History    Caleb Harper is  -58 year old male whose father died from pulmonary fibrosis 2014/2015.  He is a former smoker.  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  History dates from October 2019 when he started developing insidious onset of shortness of breath that has been progressive since then.  Around Thanksgiving 2019 he was diagnosed with multifocal pulmonary embolism associated with right lower lobe groundglass opacities.  Since then he has been on anticoagulation.  Despite anticoagulation he has had progressive shortness of breath and hypoxemia.  On May 22, 2018 underwent bronchoscopy with lavage that showed neutrophilia with volume of 61% [normal is macrophages greater than 90%] and without lymphocytosis or eosinophilia.  A transbronchial biopsy at this time was nondiagnostic.  It appears at this time he might have been started on prednisone empiric which he finished 1 week ago.  Despite this he said progressive shortness of breath.  But because of continued shortness of breath and persistent infiltrates especially on CT scan of the chest May 12, 2018 with bilateral lower lobe consolidation a surgical lung biopsy was recommended.  He has upcoming appointment Caleb Harper June 18, 2018.  However in the interim he has decompensated particularly in the last few weeks and now requiring even 6 L of oxygen at rest.  A few weeks ago he was only on 3 L oxygen.  At no point in time he has had fever or rash or joint issues.  Patient is quite upset that the rate of progression.  He feels some slowness in elucidating a diagnosis.  ILD specific hx -  reports that he quit smoking about 3 months ago. His smoking use included cigarettes. He has never used  smokeless tobacco.  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 Down to 6L HFNC. EF improved in echo at admit . Concerns for VSD in echo but cards does not think so clinically. On IV heparin in case in a few days he needs to have surgical lung biopsy. PCT noral RVP pending 2/18 to OR for VATS  Consults:  PCCM Cardiology   Procedures:  None   Significant Diagnostic Tests:  Repeat tests 2/14- echo 65%, Possible VSD but cards doubts clinically 2/14 - urine strep - neg 2/14 -SCL 70 - neg -SSA/SSB - neg -ANA - neg - RF - 15.5 -DS DNA - neg -MPO- neg -PR3neg -anti GBM  - ESR 42 Echo bubble study 2/16 > no evidence of shunt.  Micro Data:  RVP (2/14) > neg Blood cx (2/14) >   Antimicrobials:  Cefepime 2/14 > 2/15 Vancomycin 2/14 > 2/15  SUBJECTIVE/OVERNIGHT/INTERVAL HX  No acute events.  For VATS today.  Objective   Blood pressure 110/65, pulse (!) 55, temperature 98 F (36.7 C), temperature source Oral, resp. rate 19,  height _0  (1.753 m), weight 52.9 kg, SpO2 96 %.        Intake/Output Summary (Last 24 hours) at 06/01/2018 1059 Last data filed at 06/09/2018 1700 Gross per 24 hour  Intake -  Output 300 ml  Net -300 ml   Filed Weights   06/16/2018 1012 06/12/2018 1817  Weight: 53.5 kg 52.9 kg   Physical Exam:  General: Adult male, chronically ill appearing, resting in bed visiting with family at bedside. Neuro: A&O x 3, no deficits. HEENT: [place]/AT. Sclerae anicteric. HFNC in place. Cardiovascular: RRR, no M/R/G.  Lungs: Respirations even and  unlabored.  Coarse crackles in bases. Abdomen: BS x 4, soft, NT/ND.  Musculoskeletal: No gross deformities, no edema.  Skin: Intact, warm, no rashes.   Assessment & Plan:   Acute on chronic progressive hypoxemic resp failure - now with possible superimposed HCAP (abx d/c'd 2/15). - Continue supplemental O2 as needed to maintain SpO2 > 92%. - Follow CXR.  Idiopathich ILD - Radiology pattern is alternate to UIP and suggestive of BOOP - Wean down high dose steroids (IV solumedrol 245m IV q6h x 3 days) to 621mq6h today, then continue to wean down daily (will need to discuss daily oral dosing with Dr. MaVaughan Brownerlikely 10-2010mred daily). - Surgical lung biopsy planned for today 06/19/2018 by Dr. VanPrescott GumHx PE (on xarelto). - Continue heparin gtt in lieu of preadmission xarelto.  ? Aspiration. - Would repeat SLP eval (last one 05/16/18).  Rest per primary team.   RahMontey HoraA - [facility] Pager: [phone] no answer, [phone]/2020, 10:59 AM

## 2018-06-10 NOTE — Anesthesia Preprocedure Evaluation (Addendum)
Anesthesia Evaluation  Patient identified by MRN, date of birth, ID band Patient awake    Reviewed: Allergy & Precautions, H&P , NPO status , Patient's Chart, lab work & pertinent test results  Airway Mallampati: II  TM Distance: >3 FB Neck ROM: Full    Dental no notable dental hx. (+) Partial Upper, Partial Lower, Dental Advisory Given   Pulmonary COPD,  COPD inhaler and oxygen dependent, former smoker,    Pulmonary exam normal breath sounds clear to auscultation       Cardiovascular negative cardio ROS   Rhythm:Regular Rate:Normal     Neuro/Psych Anxiety negative neurological ROS     GI/Hepatic negative GI ROS, Neg liver ROS,   Endo/Other  negative endocrine ROS  Renal/GU negative Renal ROS  negative genitourinary   Musculoskeletal   Abdominal   Peds  Hematology negative hematology ROS (+)   Anesthesia Other Findings   Reproductive/Obstetrics negative OB ROS                            Anesthesia Physical Anesthesia Plan  ASA: III  Anesthesia Plan: General   Post-op Pain Management:    Induction: Intravenous  PONV Risk Score and Plan: 3 and Ondansetron, Dexamethasone and Midazolam  Airway Management Planned: Double Lumen EBT  Additional Equipment: Arterial line  Intra-op Plan:   Post-operative Plan: Possible Post-op intubation/ventilation and Extubation in OR  Informed Consent: I have reviewed the patients History and Physical, chart, labs and discussed the procedure including the risks, benefits and alternatives for the proposed anesthesia with the patient or authorized representative who has indicated his/her understanding and acceptance.     Dental advisory given  Plan Discussed with: CRNA  Anesthesia Plan Comments:         Anesthesia Quick Evaluation

## 2018-06-10 NOTE — Anesthesia Procedure Notes (Signed)
Arterial Line Insertion Start/End2/18/2020 12:40 PM Performed by: Julieta Bellini, CRNA, CRNA  Patient location: Pre-op. Preanesthetic checklist: patient identified, IV checked, site marked, risks and benefits discussed, surgical consent, monitors and equipment checked, pre-op evaluation, timeout performed and anesthesia consent Lidocaine 1% used for infiltration and patient sedated Left, radial was placed Catheter size: 20 G Hand hygiene performed  and maximum sterile barriers used   Attempts: 1 Procedure performed without using ultrasound guided technique. Following insertion, dressing applied and Biopatch. Post procedure assessment: normal  Patient tolerated the procedure well with no immediate complications.

## 2018-06-10 NOTE — Anesthesia Postprocedure Evaluation (Signed)
Anesthesia Post Note  Patient: Caleb Harper  Procedure(s) Performed: VIDEO ASSISTED THORACOSCOPY (Left Chest) LUNG BIOPSY (Left )     Patient location during evaluation: PACU Anesthesia Type: General Level of consciousness: awake and alert Pain management: pain level controlled Vital Signs Assessment: post-procedure vital signs reviewed and stable Respiratory status: spontaneous breathing, nonlabored ventilation, respiratory function stable and non-rebreather facemask Cardiovascular status: blood pressure returned to baseline and stable Postop Assessment: no apparent nausea or vomiting Anesthetic complications: no    Last Vitals:  Vitals:   06/03/2018 1458 06/09/2018 1503  BP:  117/73  Pulse:  61  Resp: (!) 21 (!) 22  Temp:    SpO2: 96% 91%    Last Pain:  Vitals:   06/04/2018 1458  TempSrc:   PainSc: Asleep                 Ayo Guarino,W. EDMOND

## 2018-06-10 NOTE — Progress Notes (Signed)
PROGRESS NOTE    Caleb Harper  GNF:[id number] DOB: 1960/09/06 DOA: 06/01/2018 PCP: [name], L.Marlou Sa, MD     Brief Narrative:  Caleb Harper is a 58 y.o. male with history h/o COPD/emphysema who quit smoking 3 months back and well-known to pulmonary clinic presents with worsening dyspnea and increasing oxygen requirement.  Patient reports being admitted to this [facility] in November for respiratory issues, diagnosed with subacute versus chronic PE/pulmonary infarct and was discharged with home O2 2 L nasal cannula and Xarelto.  He states sometime after Christmas he started noticing recurrence of exertional dyspnea which progressively worsened over the last month.  He was evaluated by his primary pulmonologist Dr.Mannam last week and advised to increase O2 to 4 L.  Patient states over the last couple of days he increased O2 to 6 L as he noticed worsening dyspnea with minimal exertion.  He does have a home pulse oximetry and reports intermittent O2 sat drops to 86% on 6 L.  He also reports 15 to 20 pound weight loss since October.  Denies any hemoptysis, hematochezia or melena.  This morning he felt extremely short of breath and summoned EMS.  On their arrival, patient was alert, oriented but noted to be in SVT.  He received 1 dose of adenosine after which he was felt to have wide-complex tachycardia and received 150 mg bolus amiodarone followed by amnio drip and route. Patient converted to sinus rhythm upon arrival here and amio drip was discontinued.  Patient evaluated by cardiology.   New events last 24 hours / Subjective: No new issues overnight. Lung biopsy planned later this morning. Remains on HFNC O2.   Assessment & Plan:   Principal Problem:   Acute hypoxemic respiratory failure (HCC) Active Problems:   Interstitial lung disease (HCC)   Acute on chronic hypoxic respiratory failure secondary to progressive ILD -Appreciate PCCM -Respiratory viral panel negative, strep pneumo Ag  negative and legionella Ag negative  -Procalcitonin <0.10  -Continue supplemental oxygen, on HFNC  -High-dose IV Solu-Medrol -Surgical lung biopsy with cardiothoracic surgery today   PSVT/AV nodal reentrant tachycardia: Secondary to problem #1 likely -Appreciate cardiology consultation -Would not give amiodarone due to his extensive pulmonary history -Started on metoprolol  Recent history of PE -Hold Xarelto in setting of pursuing surgical lung biopsy, IV heparin ordered   Chronic systolic heart failure -Continue Entresto  Leukocytosis -On steroids, monitor   Acute urinary retention -Started on flomax. Foley placed.    DVT prophylaxis: IV heparin Code Status: Full code Family Communication: No family at bedside  Disposition Plan: pending surgical lung biopsy    Consultants:   PCCM  Cardiology  Cardiothoracic surgery   Procedures:   None  Antimicrobials:  Anti-infectives (From admission, onward)   Start     Dose/Rate Route Frequency Ordered Stop   06/19/2018 1045  ceFAZolin (ANCEF) IVPB 2g/100 mL premix     2 g 200 mL/hr over 30 Minutes Intravenous 30 min pre-op 06/09/18 0944 06/02/2018 1006   06/07/18 1400  ceFEPIme (MAXIPIME) 2 g in sodium chloride 0.9 % 100 mL IVPB  Status:  Discontinued     2 g 200 mL/hr over 30 Minutes Intravenous Every 8 hours 06/07/18 1033 06/07/18 1639   06/07/18 1300  vancomycin (VANCOCIN) IVPB 1000 mg/200 mL premix  Status:  Discontinued     1,000 mg 200 mL/hr over 60 Minutes Intravenous Every 24 hours 05/28/2018 1209 06/07/18 1639   06/08/2018 2200  ceFEPIme (MAXIPIME) 1 g in sodium chloride  0.9 % 100 mL IVPB  Status:  Discontinued     1 g 200 mL/hr over 30 Minutes Intravenous Every 8 hours 06/05/2018 1209 06/07/18 1033   06/07/2018 1130  vancomycin (VANCOCIN) IVPB 1000 mg/200 mL premix     1,000 mg 200 mL/hr over 60 Minutes Intravenous  Once 06/08/2018 1123 06/20/2018 1253   06/09/2018 1130  ceFEPIme (MAXIPIME) 2 g in sodium chloride 0.9 % 100 mL  IVPB     2 g 200 mL/hr over 30 Minutes Intravenous  Once 06/08/2018 1123 06/18/2018 1228       Objective: Vitals:   06/09/18 2340 06/09/2018 0044 06/12/2018 0338 06/14/2018 0735  BP:  100/66 115/68 110/65  Pulse:  67 (!) 58 (!) 55  Resp: 15 20 20 19   Temp:  97.6 F (36.4 C) 97.6 F (36.4 C) 98 F (36.7 C)  TempSrc:  Oral Oral Oral  SpO2: 98% 96% 98% 96%  Weight:      Height:        Intake/Output Summary (Last 24 hours) at 05/30/2018 1025 Last data filed at 06/09/2018 1700 Gross per 24 hour  Intake -  Output 300 ml  Net -300 ml   Filed Weights   05/28/2018 1012 05/24/2018 1817  Weight: 53.5 kg 52.9 kg    Examination: General exam: Appears calm and comfortable  Respiratory system: Crackles diffusely, no respiratory distress  Cardiovascular system: S1 & S2 heard, RRR. No JVD, murmurs, rubs, gallops or clicks. No pedal edema. Gastrointestinal system: Abdomen is nondistended, soft and nontender. No organomegaly or masses felt. Normal bowel sounds heard. Central nervous system: Alert and oriented. No focal neurological deficits. Extremities: Symmetric 5 x 5 power. Skin: No rashes, lesions or ulcers Psychiatry: Judgement and insight appear normal. Mood & affect appropriate.     Data Reviewed: I have personally reviewed following labs and imaging studies  CBC: Recent Labs  Lab 06/04/2018 1020 06/03/2018 1428 06/07/18 0302 06/08/18 0222 06/09/18 0259 06/11/2018 0243  WBC 13.7*  --  14.1* 17.7* 22.5* 19.3*  NEUTROABS 10.8*  --   --   --   --   --   HGB 11.7* 11.6* 12.6* 11.6* 10.9* 10.7*  HCT 36.4* 34.0* 38.2* 35.6* 33.1* 32.4*  MCV 92.9  --  88.8 89.7 91.2 91.3  PLT 257  --  280 269 293 681   Basic Metabolic Panel: Recent Labs  Lab 06/02/2018 1020  05/31/2018 2314 06/07/18 0302 06/08/18 0222 06/09/18 0259 06/09/2018 0243  NA 139   < > 138 140 137 138 139  K 3.6   < > 3.5 3.9 3.7 4.0 4.0  CL 107  --  107 105 108 107 106  CO2 23  --  20* 23 23 22 25   GLUCOSE 93  --  109* 110*  141* 131* 122*  BUN 11  --  9 7 15 19 17   CREATININE 1.00  --  0.91 0.88 0.90 0.84 0.81  CALCIUM 8.4*  --  8.4* 8.8* 8.4* 8.4* 8.3*  MG 1.8  --  1.7  --   --   --   --    < > = values in this interval not displayed.   GFR: Estimated Creatinine Clearance: 75.3 mL/min (by C-G formula based on SCr of 0.81 mg/dL). Liver Function Tests: Recent Labs  Lab 06/16/2018 1020 06/09/18 0259  AST 14* 15  ALT 10 12  ALKPHOS 67 54  BILITOT 0.8 0.4  PROT 5.6* 5.2*  ALBUMIN 2.5* 2.2*   No results  for input(s): LIPASE, AMYLASE in the last 168 hours. No results for input(s): AMMONIA in the last 168 hours. Coagulation Profile: Recent Labs  Lab 05/25/2018 1020 06/09/18 0259  INR 1.18 1.26   Cardiac Enzymes: Recent Labs  Lab 05/24/2018 1020  TROPONINI <0.03   BNP (last 3 results) No results for input(s): PROBNP in the last 8760 hours. HbA1C: No results for input(s): HGBA1C in the last 72 hours. CBG: Recent Labs  Lab 06/09/18 1142 06/09/18 1339 06/09/18 1708 06/09/18 2108 06/02/2018 0734  GLUCAP 156* 182* 127* 155* 112*   Lipid Profile: No results for input(s): CHOL, HDL, LDLCALC, TRIG, CHOLHDL, LDLDIRECT in the last 72 hours. Thyroid Function Tests: Recent Labs    06/09/18 0259 06/09/18 0806  TSH 0.180*  --   FREET4  --  1.01   Anemia Panel: No results for input(s): VITAMINB12, FOLATE, FERRITIN, TIBC, IRON, RETICCTPCT in the last 72 hours. Sepsis Labs: Recent Labs  Lab 05/30/2018 1020 06/05/2018 1943 06/09/2018 2311 06/07/18 0302 06/08/18 0222  PROCALCITON  --  <0.10  --  <0.10 <0.10  LATICACIDVEN 2.0* 2.5* 1.3  --   --     Recent Results (from the past 240 hour(s))  Blood Culture (routine x 2)     Status: None (Preliminary result)   Collection Time: 05/24/2018 10:50 AM  Result Value Ref Range Status   Specimen Description BLOOD RIGHT HAND  Final   Special Requests   Final    BOTTLES DRAWN AEROBIC AND ANAEROBIC Blood Culture results may not be optimal due to an inadequate  volume of blood received in culture bottles   Culture   Final    NO GROWTH 3 DAYS Performed at [facility], [place] [address] [place], [place] [address]    Report Status PENDING  Incomplete  Blood Culture (routine x 2)     Status: None (Preliminary result)   Collection Time: 05/30/2018 11:02 AM  Result Value Ref Range Status   Specimen Description BLOOD RIGHT ANTECUBITAL  Final   Special Requests   Final    BOTTLES DRAWN AEROBIC AND ANAEROBIC Blood Culture adequate volume   Culture   Final    NO GROWTH 3 DAYS Performed at [facility], [place] [address], [place], [place] [address]    Report Status PENDING  Incomplete  MRSA PCR Screening     Status: None   Collection Time: 06/01/2018  6:23 PM  Result Value Ref Range Status   MRSA by PCR NEGATIVE NEGATIVE Final    Comment:        The GeneXpert MRSA Assay (FDA approved for NASAL specimens only), is one component of a comprehensive MRSA colonization surveillance program. It is not intended to diagnose MRSA infection nor to guide or monitor treatment for MRSA infections. Performed at [facility], [place] [address] [place], [place] [address]   Respiratory Panel by PCR     Status: None   Collection Time: 06/09/18  4:39 PM  Result Value Ref Range Status   Adenovirus NOT DETECTED NOT DETECTED Final   Coronavirus 229E NOT DETECTED NOT DETECTED Final    Comment: (NOTE) The Coronavirus on the Respiratory Panel, DOES NOT test for the novel  Coronavirus (2019 nCoV)    Coronavirus HKU1 NOT DETECTED NOT DETECTED Final   Coronavirus NL63 NOT DETECTED NOT DETECTED Final   Coronavirus OC43 NOT DETECTED NOT DETECTED Final   Metapneumovirus NOT DETECTED NOT DETECTED Final   Rhinovirus / Enterovirus NOT DETECTED NOT DETECTED  Final   Influenza A NOT DETECTED NOT DETECTED Final   Influenza B NOT DETECTED NOT DETECTED Final   Parainfluenza Virus 1 NOT DETECTED NOT DETECTED Final   Parainfluenza Virus 2 NOT DETECTED NOT DETECTED  Final   Parainfluenza Virus 3 NOT DETECTED NOT DETECTED Final   Parainfluenza Virus 4 NOT DETECTED NOT DETECTED Final   Respiratory Syncytial Virus NOT DETECTED NOT DETECTED Final   Bordetella pertussis NOT DETECTED NOT DETECTED Final   Chlamydophila pneumoniae NOT DETECTED NOT DETECTED Final   Mycoplasma pneumoniae NOT DETECTED NOT DETECTED Final    Comment: Performed at [facility], [place] [address], [place], [place] [address]  Surgical pcr screen     Status: None   Collection Time: 06/09/18  6:20 PM  Result Value Ref Range Status   MRSA, PCR NEGATIVE NEGATIVE Final   Staphylococcus aureus NEGATIVE NEGATIVE Final    Comment: (NOTE) The Xpert SA Assay (FDA approved for NASAL specimens in patients 31 years of age and older), is one component of a comprehensive surveillance program. It is not intended to diagnose infection nor to guide or monitor treatment. Performed at [facility], [place] [address] [place], [place] [address]        Radiology Studies: Dg Chest Port 1 View  Result Date: 06/09/2018 CLINICAL DATA:  Acute hypoxic respiratory failure. EXAM: PORTABLE CHEST 1 VIEW COMPARISON:  05/31/2018 FINDINGS: No significant change in the parenchymal densities in the mid and lower left lung. Persistent interstitial densities along the right lower lung and periphery of the right lower chest. Cannot exclude a small right pleural effusion. Heart size is grossly stable. Underlying emphysema. Trachea is midline. IMPRESSION: Emphysema with stable parenchymal disease in both lungs, left side greater than right. Findings are suggestive for pneumonia. No significant change from the recent comparison examination. Possible small right pleural effusion. Electronically Signed   By: Markus Daft M.D.   On: 06/09/2018 08:25      Scheduled Meds: . fluticasone furoate-vilanterol  1 puff Inhalation Daily  . insulin aspart  2-6 Units Subcutaneous TID WC  . ipratropium-albuterol  3 mL  Nebulization TID  . lidocaine  1 application Urethral Once  . metoprolol succinate  25 mg Oral Daily  . pantoprazole  40 mg Oral Daily  . sacubitril-valsartan  1 tablet Oral BID  . tamsulosin  0.4 mg Oral Daily  . umeclidinium bromide  1 puff Inhalation Daily   Continuous Infusions: . methylPREDNISolone (SOLU-MEDROL) injection 250 mg (06/12/2018 0934)     LOS: 4 days    Time spent: 20 minutes   Dessa Phi, DO [facility] [email or web address] 05/26/2018, 10:25 AM

## 2018-06-10 NOTE — Progress Notes (Signed)
Pt is s/p VATS and lung bx. Xarelto was ordered 15mg  BID. He has been on Xarelto already for his PE outpt. Spoke with [place], [place] and we will change the order to Xarelto 20mg  PO qday starting tomorrow at dinner.   Onnie Boer, PharmD, BCIDP, AAHIVP, CPP Infectious Disease Pharmacist 06/01/2018 5:58 PM

## 2018-06-10 NOTE — Op Note (Signed)
NAME: Caleb Harper, ESTY MEDICAL RECORD [id number] ACCOUNT [id number] DATE OF BIRTH:1961-04-17 FACILITY: [facility] LOCATION: [facility] PHYSICIAN:Julicia Krieger VAN TRIGT III, MD  OPERATIVE REPORT  DATE OF PROCEDURE:  06/13/2018  OPERATION:  Left VATS (video-assisted thoracoscopic surgery) with biopsy of the left upper lobe and left lower lobe.  SURGEON:  Len Childs, MD  ASSISTANT:  Nicholes Rough PA-C  ANESTHESIA:  General.  PREOPERATIVE DIAGNOSIS:  Interstitial lung disease with rapidly increasing oxygen requirement.  POSTOPERATIVE DIAGNOSIS:  Interstitial lung disease with rapidly increasing oxygen requirement.  DESCRIPTION OF PROCEDURE:  The patient was evaluated in preoperative holding where informed consent was documented, the proper site was marked, and final questions were addressed.  The patient had been referred by the pulmonary medicine service for  surgical lung biopsy for interstitial lung disease and rapidly increasing oxygen requirements, which he required hospitalization.  I discussed the procedure of the VATS and lung biopsy with the patient including the use of general anesthesia, the  location of the incisions, the use of postoperative chest tube system, and the potential risks of bleeding, air leak, infection, ventilator dependence, infection, and death.  He demonstrated his understanding and agreed to proceed with surgery under what  I felt was an informed consent.  PROCEDURE IN DETAIL:  The patient was brought to the operating room and placed supine on the operating table.  General anesthesia was induced by the anesthesia team, and the patient remained stable.  A double-lumen endotracheal tube was passed.  The  patient was turned left side up.  The left chest was prepped and draped as a sterile field.  A proper time-out was performed.  Small incisions were made in the anterior axillary line beneath the tip of the scapula and the posterior aspect of the scapula.   A VATS camera  was inserted.  There were minimal adhesions.  Using the 3 ports and the endoscopic stapling devices, a wedge of the upper lobe and lower lobe were both obtained and sent for pathology.  There was minimal bleeding.  There was a pleural  effusion, which was drained.  A chest tube was placed, directed to the apex, and secured to the skin.  The lung was reexpanded under direct vision.  The patient had remained stable with adequate oxygen saturations.  These 3 incisions were then closed in layers using Vicryl.  The chest tube was connected to an underwater seal collection chamber.  The patient was then turned supine after dressings were placed.  The patient was extubated, remained stable, and returned  to recovery room.  LN/NUANCE  D:06/05/2018 T:05/26/2018 JOB:[id number]

## 2018-06-10 NOTE — Transfer of Care (Signed)
Immediate Anesthesia Transfer of Care Note  Patient: Caleb Harper  Procedure(s) Performed: VIDEO ASSISTED THORACOSCOPY (Left Chest) LUNG BIOPSY (Left )  Patient Location: PACU  Anesthesia Type:General  Level of Consciousness: drowsy and patient cooperative  Airway & Oxygen Therapy: Patient Spontanous Breathing and Patient connected to face mask, non-rebreather  Post-op Assessment: Report given to RN, Post -op Vital signs reviewed and stable and Patient moving all extremities X 4  Post vital signs: Reviewed and stable  Last Vitals:  Vitals Value Taken Time  BP 114/78 06/02/2018  2:18 PM  Temp 36.7 C 06/17/2018  2:18 PM  Pulse 50 05/27/2018  2:23 PM  Resp 18 05/28/2018  2:23 PM  SpO2 88 % 06/16/2018  2:23 PM  Vitals shown include unvalidated device data.  Last Pain:  Vitals:   06/19/2018 1418  TempSrc:   PainSc: 7       Patients Stated Pain Goal: 3 (68/11/57 2620)  Complications: No apparent anesthesia complications

## 2018-06-10 NOTE — Progress Notes (Signed)
Removed patient from 100% NRB and placed him on heated high flow cannula at 100% and 20lpm so patient could eat. Will continue to monitor patient.

## 2018-06-10 NOTE — Anesthesia Procedure Notes (Signed)
Procedure Name: Intubation Date/Time: 06/13/2018 12:41 PM Performed by: Julieta Bellini, CRNA Pre-anesthesia Checklist: Patient identified, Emergency Drugs available, Suction available and Patient being monitored Patient Re-evaluated:Patient Re-evaluated prior to induction Oxygen Delivery Method: Circle system utilized Preoxygenation: Pre-oxygenation with 100% oxygen Induction Type: IV induction Ventilation: Mask ventilation without difficulty Laryngoscope Size: Mac and 4 Grade View: Grade I Tube type: Oral Endobronchial tube: Left and Double lumen EBT and 39 Fr Number of attempts: 1 Airway Equipment and Method: Stylet Placement Confirmation: ETT inserted through vocal cords under direct vision,  positive ETCO2 and breath sounds checked- equal and bilateral Secured at: 29 cm Tube secured with: Tape Dental Injury: Teeth and Oropharynx as per pre-operative assessment  Comments: DVL and Intubation with Dual lumen tube performed by Simeon Craft  And supervised by Wyatt Haste CRNA.

## 2018-06-10 NOTE — Brief Op Note (Signed)
05/29/2018 - 05/29/2018  8:21 AM  PATIENT:  Caleb Harper  58 y.o. male  PRE-OPERATIVE DIAGNOSIS:  ILD  POST-OPERATIVE DIAGNOSIS:  ILD  PROCEDURE:  Procedure(s): VIDEO ASSISTED THORACOSCOPY (Left) LUNG BIOPSY (Left)  SURGEON:  Surgeon(s) and Role:    Ivin Poot, MD - Primary  PHYSICIAN ASSISTANT:  Nicholes Rough, PA-C  ANESTHESIA:   general  EBL:  20 mL   BLOOD ADMINISTERED:none  DRAINS: ONE STRIAGHT CHEST TUBE   LOCAL MEDICATIONS USED:  NONE  SPECIMEN:  Source of Specimen:  PORTION OF THE LEFT UPPER AND LOWER LOBE  DISPOSITION OF SPECIMEN:  PATHOLOGY  COUNTS:  YES  DICTATION: .Dragon Dictation  PLAN OF CARE: Admit to inpatient   PATIENT DISPOSITION:  PACU - hemodynamically stable.   Delay start of Pharmacological VTE agent (>24hrs) due to surgical blood loss or risk of bleeding: yes

## 2018-06-11 ENCOUNTER — Inpatient Hospital Stay (HOSPITAL_COMMUNITY): Payer: BLUE CROSS/BLUE SHIELD

## 2018-06-11 ENCOUNTER — Telehealth: Payer: Self-pay | Admitting: Internal Medicine

## 2018-06-11 ENCOUNTER — Encounter (HOSPITAL_COMMUNITY): Payer: Self-pay | Admitting: Cardiothoracic Surgery

## 2018-06-11 ENCOUNTER — Other Ambulatory Visit: Payer: Self-pay | Admitting: Internal Medicine

## 2018-06-11 LAB — GLUCOSE, CAPILLARY
GLUCOSE-CAPILLARY: 121 mg/dL — AB (ref 70–99)
Glucose-Capillary: 103 mg/dL — ABNORMAL HIGH (ref 70–99)
Glucose-Capillary: 106 mg/dL — ABNORMAL HIGH (ref 70–99)
Glucose-Capillary: 109 mg/dL — ABNORMAL HIGH (ref 70–99)
Glucose-Capillary: 130 mg/dL — ABNORMAL HIGH (ref 70–99)
Glucose-Capillary: 136 mg/dL — ABNORMAL HIGH (ref 70–99)
Glucose-Capillary: 99 mg/dL (ref 70–99)

## 2018-06-11 LAB — CBC
HCT: 35.5 % — ABNORMAL LOW (ref 39.0–52.0)
Hemoglobin: 11.3 g/dL — ABNORMAL LOW (ref 13.0–17.0)
MCH: 29.4 pg (ref 26.0–34.0)
MCHC: 31.8 g/dL (ref 30.0–36.0)
MCV: 92.4 fL (ref 80.0–100.0)
NRBC: 0 % (ref 0.0–0.2)
Platelets: 284 10*3/uL (ref 150–400)
RBC: 3.84 MIL/uL — ABNORMAL LOW (ref 4.22–5.81)
RDW: 13.5 % (ref 11.5–15.5)
WBC: 13.4 10*3/uL — ABNORMAL HIGH (ref 4.0–10.5)

## 2018-06-11 LAB — BASIC METABOLIC PANEL
ANION GAP: 6 (ref 5–15)
BUN: 18 mg/dL (ref 6–20)
CO2: 26 mmol/L (ref 22–32)
Calcium: 8.3 mg/dL — ABNORMAL LOW (ref 8.9–10.3)
Chloride: 105 mmol/L (ref 98–111)
Creatinine, Ser: 0.74 mg/dL (ref 0.61–1.24)
GFR calc Af Amer: >60 mL/min (ref >60)
Glucose, Bld: 127 mg/dL — ABNORMAL HIGH (ref 70–99)
Potassium: 3.9 mmol/L (ref 3.5–5.1)
Sodium: 137 mmol/L (ref 135–145)

## 2018-06-11 LAB — BLOOD GAS, ARTERIAL
Acid-Base Excess: 1.1 mmol/L (ref 0.0–2.0)
Bicarbonate: 24.9 mmol/L (ref 20.0–28.0)
DRAWN BY: 252031
FIO2: 100
O2 Saturation: 94.1 %
Patient temperature: 98.6
pCO2 arterial: 37.6 mmHg (ref 32.0–48.0)
pH, Arterial: 7.436 (ref 7.350–7.450)
pO2, Arterial: 71.6 mmHg — ABNORMAL LOW (ref 83.0–108.0)

## 2018-06-11 LAB — CULTURE, BLOOD (ROUTINE X 2)
Culture: NO GROWTH
Culture: NO GROWTH
Special Requests: ADEQUATE

## 2018-06-11 LAB — TYPE AND SCREEN
ABO/RH(D): A NEG
Antibody Screen: NEGATIVE
Unit division: 0

## 2018-06-11 LAB — BPAM RBC
Blood Product Expiration Date: 202002292359
Unit Type and Rh: 600

## 2018-06-11 MED ORDER — ONDANSETRON HCL 4 MG/2ML IJ SOLN: 4.0000 mg | Freq: Four times a day (QID) | INTRAMUSCULAR | Status: DC | PRN

## 2018-06-11 MED ORDER — FENTANYL CITRATE (PF) 100 MCG/2ML IJ SOLN
25.0000 ug | INTRAMUSCULAR | Status: DC | PRN
  Administered 2018-06-11 – 2018-06-14 (×19): 100 ug via INTRAVENOUS
  Filled 2018-06-11 (×22): qty 2

## 2018-06-11 NOTE — Progress Notes (Signed)
Nurse attempted to D/C foley, patient stated that "he is in too much pain and has previously experienced pain upon urination." Nurse will attempt tomorrow to remove foley.

## 2018-06-11 NOTE — Progress Notes (Signed)
39mL Fentanyl from PCA wasted in Steri Cycle. Witnessed by [employer].

## 2018-06-11 NOTE — Telephone Encounter (Signed)
Brent/Praveen  Nilesh pathologist just called \\me  4:40 PM 06/11/2018\ - SLB is cw Adenoncarcinoma. ATS algorithm worked well making a case of bx in situation of CT that is "alternate to UIP"  Thanks  MR

## 2018-06-11 NOTE — Progress Notes (Signed)
NAME:  Caleb Harper, MRN:  [id number], DOB:  30-Oct-1960, LOS: 5 ADMISSION DATE:  06/14/2018, CONSULTATION DATE:  05/25/2018 REFERRING MD:  Sabra Heck, CHIEF COMPLAINT:  Dyspnea   Brief History   58 y/o male with diffuse parenchymal lung disease of uncertain etiology s/p VATS on 2/18, has high O2 requirements.   Past Medical History  PE, Emphysema, tobacco abuse  Significant Hospital Events   2/14 - Admitted to [facility] hospitalist service, cardiology and CCM consulted ./  Started high dose IV steroids - 1g/day 2/15 -  Feels "tad better" better. Down to 6L HFNC. EF improved in echo at admit . Concerns for VSD in echo but cards does not think so clinically. On IV heparin in case in a few days he needs to have surgical lung biopsy. PCT noral RVP pending 2/18 to OR for VATS  Consults:  PCCM Cardiology   Procedures:  None   Significant Diagnostic Tests:  Repeat tests 2/14- echo 65%, Possible VSD but cards doubts clinically 2/14 - urine strep - neg 2/14 -SCL 70 - neg -SSA/SSB - neg -ANA - neg - RF - 15.5 -DS DNA - neg -MPO- neg -PR3neg -anti GBM  - ESR 42 Echo bubble study 2/16 > no evidence of shunt.  Micro Data:  RVP (2/14) > neg Blood cx (2/14) >   Antimicrobials:  Cefepime 2/14 > 2/15 Vancomycin 2/14 > 2/15   Interim history/subjective:  Still requiring high O2 Pain OK   Objective   Blood pressure 102/62, pulse 61, temperature 97.8 F (36.6 C), temperature source Oral, resp. rate 20, height 5' 9" (1.753 m), weight 52.9 kg, SpO2 90 %.    FiO2 (%):  [100 %] 100 %   Intake/Output Summary (Last 24 hours) at 06/11/2018 0800 Last data filed at 06/11/2018 0700 Gross per 24 hour  Intake 3311.22 ml  Output 1288 ml  Net 2023.22 ml   Filed Weights   06/16/2018 1012 06/11/2018 1817  Weight: 53.5 kg 52.9 kg    Examination:  General:  Resting comfortably in bed HENT: NCAT OP clear PULM: Rhonchi on R, crackles left, normal effort CV: RRR, no mgr GI: BS+, soft,  nontender MSK: normal bulk and tone Neuro: awake, alert, no distress, MAEW  Resolved Hospital Problem list     Assessment & Plan:  Diffuse parenchymal lung disease: emphysema and some other rapidly progressive process in lower lobes, unclear etiology, path pending from VATS biopsy on 2/18 > continue solumedrol > f/u path > chest tube per TCTS  Acute on chronic respiratory failure with hypoxemia > wean off O2 for O2 saturation > 88% > change from PCA to prn fentanyl to accommodate heated high flow O2 nasal cannula > out of bed if able  Pain control > prn fentanyl > prn toradol > prn oxycodone  COPD > Breo, Incruise > duoneb prn  Advance diet  Best practice:  Diet: advance regular diet Pain/Anxiety/Delirium protocol (if indicated): n/a VAP protocol (if indicated): n/a DVT prophylaxis: sub q hep GI prophylaxis: n/a Glucose control: monitor, SSI Mobility: out of bed as able today Code Status: full Family Communication: updated sister on 2/18 Disposition: remain in ICU  Labs   CBC: Recent Labs  Lab 06/01/2018 1020  06/07/18 0302 06/08/18 0222 06/09/18 0259 06/13/2018 0243 05/25/2018 1724 06/11/18 0358  WBC 13.7*  --  14.1* 17.7* 22.5* 19.3*  --  13.4*  NEUTROABS 10.8*  --   --   --   --   --   --   --  HGB 11.7*   < > 12.6* 11.6* 10.9* 10.7* 10.5* 11.3*  HCT 36.4*   < > 38.2* 35.6* 33.1* 32.4* 31.0* 35.5*  MCV 92.9  --  88.8 89.7 91.2 91.3  --  92.4  PLT 257  --  280 269 293 286  --  284   < > = values in this interval not displayed.    Basic Metabolic Panel: Recent Labs  Lab 06/03/2018 1020  06/12/2018 2314 06/07/18 0302 06/08/18 0222 06/09/18 0259 05/25/2018 0243 06/09/2018 1724 06/11/18 0358  NA 139   < > 138 140 137 138 139 138 137  K 3.6   < > 3.5 3.9 3.7 4.0 4.0 4.0 3.9  CL 107  --  107 105 108 107 106  --  105  CO2 23  --  20* _0 --  26  GLUCOSE 93  --  109* 110* 141* 131* 122*  --  127*  BUN 11  --  _1 --  18  CREATININE 1.00   --  0.91 0.88 0.90 0.84 0.81  --  0.74  CALCIUM 8.4*  --  8.4* 8.8* 8.4* 8.4* 8.3*  --  8.3*  MG 1.8  --  1.7  --   --   --   --   --   --    < > = values in this interval not displayed.   GFR: Estimated Creatinine Clearance: 76.2 mL/min (by C-G formula based on SCr of 0.74 mg/dL). Recent Labs  Lab 05/24/2018 1020 05/24/2018 1943 06/18/2018 2311 06/07/18 0302 06/08/18 0222 06/09/18 0259 06/04/2018 0243 06/11/18 0358  PROCALCITON  --  <0.10  --  <0.10 <0.10  --   --   --   WBC 13.7*  --   --  14.1* 17.7* 22.5* 19.3* 13.4*  LATICACIDVEN 2.0* 2.5* 1.3  --   --   --   --   --     Liver Function Tests: Recent Labs  Lab 06/02/2018 1020 06/09/18 0259  AST 14* 15  ALT 10 12  ALKPHOS 67 54  BILITOT 0.8 0.4  PROT 5.6* 5.2*  ALBUMIN 2.5* 2.2*   No results for input(s): LIPASE, AMYLASE in the last 168 hours. No results for input(s): AMMONIA in the last 168 hours.  ABG    Component Value Date/Time   PHART 7.436 06/11/2018 0435   PCO2ART 37.6 06/11/2018 0435   PO2ART 71.6 (L) 06/11/2018 0435   HCO3 24.9 06/11/2018 0435   TCO2 27 06/11/2018 1724   O2SAT 94.1 06/11/2018 0435     Coagulation Profile: Recent Labs  Lab 06/05/2018 1020 06/09/18 0259  INR 1.18 1.26    Cardiac Enzymes: Recent Labs  Lab 06/04/2018 1020  TROPONINI <0.03    HbA1C: No results found for: HGBA1C  CBG: Recent Labs  Lab 06/11/2018 1704 05/24/2018 1936 06/17/2018 2356 06/11/18 0357 06/11/18 0733  GLUCAP 102* 103* 130* 109* 106*     Critical care time: 40 minutes    Roselie Awkward, MD [place] [facility] Pager: [phone] Cell: [phone] If no response, call [phone]

## 2018-06-11 NOTE — Progress Notes (Addendum)
      [address]       [place] [address]             [phone]      1 Day Post-Op Procedure(s) (LRB): VIDEO ASSISTED THORACOSCOPY (Left) LUNG BIOPSY (Left) Subjective: Feels okay this morning. Still having a lot of pain.   Objective: Vital signs in last 24 hours: Temp:  [96.5 F (35.8 C)-98 F (36.7 C)] 97.8 F (36.6 C) (02/19 0736) Pulse Rate:  [49-86] 86 (02/19 0900) Cardiac Rhythm: Sinus bradycardia (02/19 0400) Resp:  [12-29] 23 (02/19 0900) BP: (89-117)/(51-89) 108/68 (02/19 0900) SpO2:  [83 %-96 %] 83 % (02/19 0900) Arterial Line BP: (76-142)/(41-80) 135/64 (02/19 0900) FiO2 (%):  [100 %] 100 % (02/19 0811)     Intake/Output from previous day: 02/18 0701 - 02/19 0700 In: 3311.2 [I.V.:3111.4; IV Piggyback:199.9] Out: 1288 [Urine:870; Blood:20; Chest Tube:398] Intake/Output this shift: Total I/O In: -  Out: 300 [Urine:150; Chest Tube:150]  General appearance: alert, cooperative and no distress Heart: regular rate and rhythm, S1, S2 normal, no murmur, click, rub or gallop Lungs: clear to auscultation bilaterally and diminished in the lower lobes Abdomen: soft, non-tender; bowel sounds normal; no masses,  no organomegaly Extremities: extremities normal, atraumatic, no cyanosis or edema Wound: clean and dry dressed with a sterile dressing  Lab Results: Recent Labs    06/11/2018 0243 06/19/2018 1724 06/11/18 0358  WBC 19.3*  --  13.4*  HGB 10.7* 10.5* 11.3*  HCT 32.4* 31.0* 35.5*  PLT 286  --  284   BMET:  Recent Labs    06/18/2018 0243 06/17/2018 1724 06/11/18 0358  NA 139 138 137  K 4.0 4.0 3.9  CL 106  --  105  CO2 25  --  26  GLUCOSE 122*  --  127*  BUN 17  --  18  CREATININE 0.81  --  0.74  CALCIUM 8.3*  --  8.3*    PT/INR:  Recent Labs    06/09/18 0259  LABPROT 15.6*  INR 1.26   ABG    Component Value Date/Time   PHART 7.436 06/11/2018 0435   HCO3 24.9 06/11/2018 0435   TCO2 27 06/05/2018 1724   O2SAT 94.1  06/11/2018 0435   CBG (last 3)  Recent Labs    05/24/2018 2356 06/11/18 0357 06/11/18 0733  GLUCAP 130* 109* 106*    Assessment/Plan: S/P Procedure(s) (LRB): VIDEO ASSISTED THORACOSCOPY (Left) LUNG BIOPSY (Left)  1. CV-BP well controlled, NSR to SB 2. Pulm-currently on HFNC at 15L. Chest tube put out 150cc/since surgery.  3. Renal-creatinine 0.74, electrolytes okay 4. H and H stable 11.3/35.5 5. Continue post-op antibiotics 6. Patient with PE-Xarelto 20mg  starting tonight. 7. Pain control-IV fentanyl ordered, Toradol 15mg  PRN x 5 doses, and Ultram PO ordered.   Plan: Medical management per primary. Leave chest tubes for now. Continue to work on pain control with the above medications. PCA was eliminated. Start Xarelto later today.      LOS: 5 days    Elgie Collard 06/11/2018  plan chest tube to water seal tomorrow patient examined and medical record reviewed,agree with above note. Tharon Aquas Trigt III 06/11/2018

## 2018-06-11 NOTE — Progress Notes (Signed)
[facility]  Notified earlier today that the pathology showed adenocarcinoma.  Discussed with the patient's brother who wishes for me to tell the patient on 2/20 at 0900 with family present.    Discussed situation with Dr. Earlie Server.  He recommends Foundation 1 and PDL-1 testing be performed in addition to [employer] 360 blood testing.  Roselie Awkward, MD [employer] [facility] Pager: [phone] Cell: [phone] If no response, call [phone]

## 2018-06-12 ENCOUNTER — Inpatient Hospital Stay (HOSPITAL_COMMUNITY): Payer: BLUE CROSS/BLUE SHIELD

## 2018-06-12 ENCOUNTER — Encounter (HOSPITAL_COMMUNITY): Payer: Self-pay | Admitting: Oncology

## 2018-06-12 DIAGNOSIS — C3490 Malignant neoplasm of unspecified part of unspecified bronchus or lung: Secondary | ICD-10-CM

## 2018-06-12 DIAGNOSIS — Z7189 Other specified counseling: Secondary | ICD-10-CM

## 2018-06-12 LAB — CBC
HCT: 36.2 % — ABNORMAL LOW (ref 39.0–52.0)
Hemoglobin: 11.9 g/dL — ABNORMAL LOW (ref 13.0–17.0)
MCH: 29.9 pg (ref 26.0–34.0)
MCHC: 32.9 g/dL (ref 30.0–36.0)
MCV: 91 fL (ref 80.0–100.0)
PLATELETS: 240 10*3/uL (ref 150–400)
RBC: 3.98 MIL/uL — ABNORMAL LOW (ref 4.22–5.81)
RDW: 13.7 % (ref 11.5–15.5)
WBC: 13 10*3/uL — ABNORMAL HIGH (ref 4.0–10.5)
nRBC: 0 % (ref 0.0–0.2)

## 2018-06-12 LAB — COMPREHENSIVE METABOLIC PANEL
ALBUMIN: 2 g/dL — AB (ref 3.5–5.0)
ALT: 10 U/L (ref 0–44)
ANION GAP: 11 (ref 5–15)
AST: 15 U/L (ref 15–41)
Alkaline Phosphatase: 46 U/L (ref 38–126)
BUN: 17 mg/dL (ref 6–20)
CO2: 24 mmol/L (ref 22–32)
Calcium: 8.5 mg/dL — ABNORMAL LOW (ref 8.9–10.3)
Chloride: 103 mmol/L (ref 98–111)
Creatinine, Ser: 0.71 mg/dL (ref 0.61–1.24)
GFR calc Af Amer: >60 mL/min (ref >60)
GFR calc non Af Amer: >60 mL/min (ref >60)
Glucose, Bld: 125 mg/dL — ABNORMAL HIGH (ref 70–99)
Potassium: 4.4 mmol/L (ref 3.5–5.1)
SODIUM: 138 mmol/L (ref 135–145)
Total Bilirubin: 0.6 mg/dL (ref 0.3–1.2)
Total Protein: 5.1 g/dL — ABNORMAL LOW (ref 6.5–8.1)

## 2018-06-12 LAB — GLUCOSE, CAPILLARY
GLUCOSE-CAPILLARY: 115 mg/dL — AB (ref 70–99)
GLUCOSE-CAPILLARY: 118 mg/dL — AB (ref 70–99)
GLUCOSE-CAPILLARY: 143 mg/dL — AB (ref 70–99)
Glucose-Capillary: 125 mg/dL — ABNORMAL HIGH (ref 70–99)
Glucose-Capillary: 121 mg/dL — ABNORMAL HIGH (ref 70–99)
Glucose-Capillary: 140 mg/dL — ABNORMAL HIGH (ref 70–99)

## 2018-06-12 NOTE — Progress Notes (Addendum)
[name]       [place] [address]             [phone]      2 Days Post-Op Procedure(s) (LRB): VIDEO ASSISTED THORACOSCOPY (Left) LUNG BIOPSY (Left) Subjective: Remains dyspneic  Objective: Vital signs in last 24 hours: Temp:  [97.7 F (36.5 C)-98.2 F (36.8 C)] 97.8 F (36.6 C) (02/20 0741) Pulse Rate:  [47-110] 81 (02/20 0900) Cardiac Rhythm: Normal sinus rhythm (02/20 0800) Resp:  [12-38] 21 (02/20 0900) BP: (88-146)/(53-93) 146/81 (02/20 0900) SpO2:  [81 %-97 %] 84 % (02/20 0900) Arterial Line BP: (111-158)/(48-69) 158/69 (02/19 1700) FiO2 (%):  [100 %] 100 % (02/20 0752)  Hemodynamic parameters for last 24 hours:    Intake/Output from previous day: 02/19 0701 - 02/20 0700 In: 1301.1 [P.O.:120; I.V.:1181.1] Out: 2050 [Urine:1450; Chest Tube:600] Intake/Output this shift: Total I/O In: 99.9 [I.V.:99.9] Out: -   General appearance: alert, cooperative and mild distress Heart: irregularly irregular rhythm and frequent extrasystole Lungs: coarse, dim in lower fields Abdomen: soft Extremities: no edema or calf tenderness Wound: ok  Lab Results: Recent Labs    06/11/18 0358 06/12/18 0657  WBC 13.4* 13.0*  HGB 11.3* 11.9*  HCT 35.5* 36.2*  PLT 284 240   BMET:  Recent Labs    06/11/18 0358 06/12/18 0657  NA 137 138  K 3.9 4.4  CL 105 103  CO2 26 24  GLUCOSE 127* 125*  BUN 18 17  CREATININE 0.74 0.71  CALCIUM 8.3* 8.5*    PT/INR: No results for input(s): LABPROT, INR in the last 72 hours. ABG    Component Value Date/Time   PHART 7.436 06/11/2018 0435   HCO3 24.9 06/11/2018 0435   TCO2 27 06/09/2018 1724   O2SAT 94.1 06/11/2018 0435   CBG (last 3)  Recent Labs    06/11/18 2321 06/12/18 0332 06/12/18 0738  GLUCAP 121* 125* 121*    Meds Scheduled Meds: . acetaminophen  1,000 mg Oral Q6H   Or  . acetaminophen (TYLENOL) oral liquid 160 mg/5 mL  1,000 mg Oral Q6H  . bisacodyl  10 mg Oral Daily  .  fluticasone furoate-vilanterol  1 puff Inhalation Daily  . insulin aspart  0-24 Units Subcutaneous Q4H  . levalbuterol  0.63 mg Nebulization Q6H  . mouth rinse  15 mL Mouth Rinse BID  . methylPREDNISolone (SOLU-MEDROL) injection  60 mg Intravenous Q6H  . rivaroxaban  20 mg Oral Q supper  . senna-docusate  1 tablet Oral QHS  . tamsulosin  0.4 mg Oral Daily  . umeclidinium bromide  1 puff Inhalation Daily   Continuous Infusions: . dextrose 5 % and 0.45% NaCl 50 mL/hr at 06/12/18 0900  . potassium chloride     PRN Meds:.fentaNYL (SUBLIMAZE) injection, ipratropium-albuterol, ketorolac, ondansetron (ZOFRAN) IV, oxyCODONE, polyethylene glycol, potassium chloride, RESOURCE THICKENUP CLEAR, traMADol  Xrays Dg Chest Port 1 View  Result Date: 06/12/2018 CLINICAL DATA:  Chest tube in place. EXAM: PORTABLE CHEST 1 VIEW COMPARISON:  06/11/2018. FINDINGS: LEFT chest tube good position. Unchanged small LEFT pneumothorax, with lateral loculation. BILATERAL pulmonary opacities LEFT greater than RIGHT appear increased. Stable cardiomediastinal silhouette. IMPRESSION: 1. Stable small LEFT pneumothorax. LEFT chest tube good position. 2. Worsening aeration. Electronically Signed   By: Staci Righter M.D.   On: 06/12/2018 07:04   Dg Chest Port 1 View  Result Date: 06/11/2018 CLINICAL DATA:  Chest tube EXAM: PORTABLE CHEST 1 VIEW COMPARISON:  Yesterday FINDINGS: Interstitial  coarsening and airspace opacity at the bases. Small apical and lateral left pneumothorax without convincing change. Stable apical chest tube positioning. Chest wall emphysema is stable to decreased. Normal heart size. Background COPD/emphysema. IMPRESSION: 1. Unchanged small left pneumothorax. 2. Unchanged airspace disease superimposed on chronic lung disease. Electronically Signed   By: Monte Fantasia M.D.   On: 06/11/2018 07:29   Dg Chest Port 1 View  Result Date: 06/02/2018 CLINICAL DATA:  Status post left VATS with open lung biopsy.  EXAM: PORTABLE CHEST 1 VIEW COMPARISON:  06/09/2018 FINDINGS: Left chest tube present with small lateral and basilar pneumothorax after surgery. Severe underlying fibrotic lung disease again noted. No overt pulmonary edema or significant pleural effusions. The heart size is normal. IMPRESSION: Small postoperative left-sided lateral and basilar pneumothorax. Single left chest tube is present. Electronically Signed   By: Aletta Edouard M.D.   On: 06/05/2018 17:02   Chest tube- + air leak, 690 cc drainage yesterday, 150 so far today FINAL DIAGNOSIS Diagnosis 1. Lung, wedge biopsy/resection, left lower lobe - ADENOCARCINOMA WITH DIFFUSE AIRWAY SPREAD. SEE NOTE. 2. Lung, wedge biopsy/resection, left upper lobe - ADENOCARCINOMA WITH DIFFUSE AIRWAY SPREAD. SEE NOTE. Diagnosis Note 1. -2. Immunohistochemical stains will be performed to confirm primary pulmonary origin and reported in an addendum. The background lung parenchyma shows features of organizing acute injury, most likely from diffuse airway spread of the tumor. Dr. Lyndon Code has reviewed the case and concurs with the above interpretation. Dr. Vaughan Browner was notified on 06/11/2018. Jaquita Folds MD Pathologist, Electronic Signature (Case signed 06/11/2018) Specimen Gross and Clinical Information Specimen(s) Obtained: 1. Lung, wedge biopsy/resection, left lower lobe 2. Lung, wedge biopsy/resection, left upper lobe Specimen Clinical Information 1. ILD (cm) Gross 1. Received fresh is a 2 gram, 4 x 1.6 x 0.6 cm wedge of lung, clinically left lower lobe, with a staple line on one aspect. The pleura is pink-red, smooth, slightly anthracotic. Cut surfaces are tan-pink to anthracotic, soft to rubbery. No discrete lesions or masses are identified. Except for tissue lined by staples, the specimen is entirely submitted in three blocks. 2. Received fresh is a 4 gram, 2.8 x 2 x 1.2 cm wedge of lung tissue, clinically left upper lobe, with a staple line on  one 1 of 2 FINAL for Hofman, Chung M ([id number]) Gross(continued) aspect. The pleura is pink, smooth, slightly anthracotic, and cut surfaces are tan-pink to red, soft. A discrete lesion or mass is not identified. Except for tissue lined by staples, the specimen is entirely submitted in three blocks. (SSW:ah 05/27/2018) Stain(s) used in Diagnosis: The following stain(s) were used in diagnosing the case: Napsin-A, Thyroid Transcription Factor -1. The control(s) stained appropriately. Disclaimer Some of these immunohistochemical stains may have been developed and the performance characteristics determined by [facility]. Some may not have been cleared or approved by the U.S. Food and Drug Administration. The FDA has determined that such clearance or approval is not necessary. This test is used for clinical purposes. It should not be regarded as investigational or for research. This laboratory is certified under the [place] (CLIA-88) as qualified to perform high complexity clinical laboratory testing. Report signed out from the following location(s) Technical Component was performed at [facility] [place], [place], [place] [address]. CLIA #: [id number], Technical component and interpretation was performed at [facility] [place], [place], [place] [address]. CLIA #: [id number], 2 of Assessment/Plan: S/P Procedure(s) (LRB): VIDEO ASSISTED THORACOSCOPY (  Left) LUNG BIOPSY (Left)  1 conts with significant pulm compromise on HFNC- pathology noted above- adenocarcinoma. Plans for pulm care as outlined per pulmonary 2 keep CT with air leak, mod-high  drainage, if drainage remains high may get some benefit from a pleurx 3 pain control 4 renal fxn stable 5 leukocytosis stable 6 H/H stable 7 will need oncology evaluation and possible palliative care   LOS: 6 days    Caleb Harper  [facility] 06/12/2018 Pager [phone]   Chest tube to water seal Mobilize OOB and advance diet Consider talc pleurodesis  If tube cannot be removed soon Daily CXR while tube in place Resume NOAC [hx pulmonary embolus]   patient examined and medical record reviewed,agree with above note. Tharon Aquas Trigt III 06/12/2018

## 2018-06-12 NOTE — Consult Note (Addendum)
[place]  [phone] [phone] [place] [facility] - INITIAL CONSULTATION  Referral MD: Dr. Simonne Maffucci  Reason for Referral/Chief Complaint: Newly diagnosed non-small cell lung cancer, adnocarcinoma.   HPI: Caleb Harper is a 58 y.o. male with history h/o COPD/emphysema who quit smoking 3 months ago. He has been followed by Pulmonology for worsening dyspnea and increasing oxygen requirement.  Patient reports being admitted to this [facility] in November for respiratory issues, diagnosed with subacute versus chronic PE/pulmonary infarct and was discharged with home O2 2 L nasal cannula and DOAC.  He states sometime after Christmas he started noticing recurrence of exertional dyspnea which progressively worsened over the last month.  He was evaluated by his primary pulmonologist Dr.Mannam prior to admission advised to increase O2 to 4 L.  He continued to increase his O2 to 6 L as he noticed worsening dyspnea with minimal exertion.  He does have a home pulse oximetry and reports intermittent O2 sat drops to 86% on 6 L.  He also reports 15 to 20 pound weight loss since October.  Denies any hemoptysis, hematochezia or melena.  On the day of admission he felt extremely short of breath and called EMS.  On their arrival, patient was alert, oriented but noted to be in SVT.  He received 1 dose of adenosine after which he was felt to have wide-complex tachycardia and received 150 mg bolus amiodarone followed by amnio drip and route.   Patient converted to sinus rhythm upon arrival here and amnio drip was discontinued.  Patient evaluated by cardiology who reviewed rhythm strips and felt arrhythmia related to hypoxia with AV node reentrant tachycardia.  They recommended admission with beta-blockers and cardiac monitoring.  Patient was requiring 8 L O2 in the ED to maintain sat greater than 90%.  He has had progressively worsening  lung infiltrates on imaging studies done recently.  Prior to admission, he also underwent bronchoscopy by pulmonary with cultures indicating infection with gram-positive cocci.  He completed a two-week course with Augmentin for this.  He denies any fevers at home.  Cardiothoracic surgery was asked to evaluate the patient for a lung biopsy in the absence of any improvement despite high dose steroids. The patient underwent a left VATS on 06/05/2018. Biopsy results are consistent with non-small cell lung cancer, adenocarcinoma.  The patient reports ongoing shortness of breath and cough.  He denies hemoptysis.  Reports about 20 pound weight loss over a few months period of time.  Reports fatigue.  Denies headaches and visual changes.  Denies chest pain.  Reports chronic back pain. Medical Oncology was asked to see the patient for further recommendations.    Past Medical History:  Diagnosis Date  . Chronic back pain   . Emphysema lung ([place])   . Tobacco abuse   :  Past Surgical History:  Procedure Laterality Date  . BACK SURGERY    . BACK SURGERY     Reports back surgery in the late 1980s due to a ruptured disc.  Marland Kitchen LUNG BIOPSY Left 06/11/2018   Procedure: LUNG BIOPSY;  Surgeon: Ivin Poot, MD;  Location: [facility];  Service: Thoracic;  Laterality: Left;  Marland Kitchen VIDEO ASSISTED THORACOSCOPY Left 06/05/2018   Procedure: VIDEO ASSISTED THORACOSCOPY;  Surgeon: Ivin Poot, MD;  Location: [place];  Service: Thoracic;  Laterality: Left;  Marland Kitchen VIDEO BRONCHOSCOPY Bilateral 05/22/2018   Procedure: VIDEO BRONCHOSCOPY WITH FLUORO;  Surgeon: Vaughan Browner,  Hart Robinsons, MD;  Location: [facility];  Service: Cardiopulmonary;  Laterality: Bilateral;  :  Current Facility-Administered Medications  Medication Dose Route Frequency Provider Last Rate Last Dose  . acetaminophen (TYLENOL) tablet 1,000 mg  1,000 mg Oral Q6H Harriet Pho, Tessa N, PA-C   1,000 mg at 06/12/18 1123   Or  . acetaminophen (TYLENOL) solution 1,000 mg  1,000 mg  Oral Q6H Conte, Tessa N, PA-C      . bisacodyl (DULCOLAX) EC tablet 10 mg  10 mg Oral Daily Nicholes Rough N, PA-C   10 mg at 06/12/18 1129  . dextrose 5 %-0.45 % sodium chloride infusion   Intravenous Continuous Simonne Maffucci B, MD 50 mL/hr at 06/12/18 1000    . fentaNYL (SUBLIMAZE) injection 25-100 mcg  25-100 mcg Intravenous Q2H PRN Juanito Doom, MD   100 mcg at 06/12/18 1124  . fluticasone furoate-vilanterol (BREO ELLIPTA) 100-25 MCG/INH 1 puff  1 puff Inhalation Daily Nicholes Rough N, PA-C   1 puff at 06/12/18 1141  . insulin aspart (novoLOG) injection 0-24 Units  0-24 Units Subcutaneous Q4H Elgie Collard, [place]   2 Units at 06/12/18 3536  . ipratropium-albuterol (DUONEB) 0.5-2.5 (3) MG/3ML nebulizer solution 3 mL  3 mL Nebulization Q6H PRN Harriet Pho, Tessa N, PA-C      . ketorolac (TORADOL) 15 MG/ML injection 15 mg  15 mg Intravenous Q6H PRN Conte, Tessa N, PA-C      . levalbuterol (XOPENEX) nebulizer solution 0.63 mg  0.63 mg Nebulization Q6H Conte, Tessa N, PA-C   0.63 mg at 06/12/18 1443  . MEDLINE mouth rinse  15 mL Mouth Rinse BID Dessa Phi, DO   15 mL at 06/12/18 1007  . methylPREDNISolone sodium succinate (SOLU-MEDROL) 125 mg/2 mL injection 60 mg  60 mg Intravenous Q6H Conte, Tessa N, PA-C   60 mg at 06/12/18 1540  . ondansetron (ZOFRAN) injection 4 mg  4 mg Intravenous Q6H PRN Simonne Maffucci B, MD      . oxyCODONE (Oxy IR/ROXICODONE) immediate release tablet 5-10 mg  5-10 mg Oral Q4H PRN Harriet Pho, Tessa N, PA-C   10 mg at 06/12/18 1129  . polyethylene glycol (MIRALAX / GLYCOLAX) packet 17 g  17 g Oral Daily PRN Conte, Tessa N, PA-C      . potassium chloride 10 mEq in 50 mL *CENTRAL LINE* IVPB  10 mEq Intravenous Daily PRN Harriet Pho, Tessa N, PA-C      . RESOURCE THICKENUP CLEAR   Oral PRN Dessa Phi, DO      . rivaroxaban (XARELTO) tablet 20 mg  20 mg Oral Q supper Conte, Tessa N, PA-C   20 mg at 06/11/18 1701  . senna-docusate (Senokot-S) tablet 1 tablet  1 tablet Oral QHS  Conte, Tessa N, PA-C      . tamsulosin (FLOMAX) capsule 0.4 mg  0.4 mg Oral Daily Nicholes Rough N, PA-C   0.4 mg at 06/12/18 1129  . traMADol (ULTRAM) tablet 50-100 mg  50-100 mg Oral Q6H PRN Conte, Tessa N, PA-C      . umeclidinium bromide (INCRUSE ELLIPTA) 62.5 MCG/INH 1 puff  1 puff Inhalation Daily Nicholes Rough N, PA-C   1 puff at 06/12/18 1141     Allergies  Allergen Reactions  . Chantix [Varenicline Tartrate] Nausea And Vomiting  :  Family History  Problem Relation Age of Onset  . Breast cancer Mother   . COPD Father   . Lymphoma Father   :  Social History   Socioeconomic History  .  Marital status: Divorced    Spouse name: Not on file  . Number of children: Not on file  . Years of education: Not on file  . Highest education level: Not on file  Occupational History  . Not on file  Social Needs  . Financial resource strain: Not on file  . Food insecurity:    Worry: Not on file    Inability: Not on file  . Transportation needs:    Medical: Not on file    Non-medical: Not on file  Tobacco Use  . Smoking status: Former Smoker    Packs/day: 1.00    Years: 30.00    Pack years: 30.00    Types: Cigarettes    Last attempt to quit: 02/12/2018    Years since quitting: 0.3  . Smokeless tobacco: Never Used  Substance and Sexual Activity  . Alcohol use: Not Currently    Comment: Reports that he used to "binge drink."  Stopped drinking alcohol in fall 2019.  . Drug use: No  . Sexual activity: Not on file  Lifestyle  . Physical activity:    Days per week: Not on file    Minutes per session: Not on file  . Stress: Not on file  Relationships  . Social connections:    Talks on phone: Not on file    Gets together: Not on file    Attends religious service: Not on file    Active member of club or organization: Not on file    Attends meetings of clubs or organizations: Not on file    Relationship status: Not on file  . Intimate partner violence:    Fear of current or ex  partner: Not on file    Emotionally abused: Not on file    Physically abused: Not on file    Forced sexual activity: Not on file  Other Topics Concern  . Not on file  Social History Narrative  . Not on file  :  Constitutional: negative except for weight loss Eyes: negative for visual disturbance Ears, nose, mouth, throat, and face: negative Respiratory: negative except for cough, dyspnea on exertion and Shortness of breath.  O2 dependent. Cardiovascular: negative for chest pain and lower extremity edema Gastrointestinal: negative Genitourinary:negative Hematologic/lymphatic: negative Musculoskeletal:negative except for back pain Neurological: negative for dizziness, gait problems, headaches and seizures Behavioral/Psych: negative  Exam: Patient Vitals for the past 24 hrs:  BP Temp Temp src Pulse Resp SpO2  06/12/18 1141 122/78 - - 66 16 90 %  06/12/18 1130 122/78 - - (!) 55 (!) 9 90 %  06/12/18 1116 - 98.3 F (36.8 C) Oral - - -  06/12/18 1100 (!) 142/106 - - 79 (!) 23 (!) 86 %  06/12/18 1000 128/86 - - (!) 109 (!) 26 (!) 82 %  06/12/18 0900 (!) 146/81 - - 81 (!) 21 (!) 84 %  06/12/18 0800 128/79 - - (!) 51 - 91 %  06/12/18 0752 132/80 - - 62 16 -  06/12/18 0741 - 97.8 F (36.6 C) Oral - - -  06/12/18 0700 132/80 - - (!) 55 17 91 %  06/12/18 0610 - - - 60 17 (!) 87 %  06/12/18 0607 - - - 61 15 90 %  06/12/18 0605 126/79 - - 67 19 (!) 85 %  06/12/18 0600 - - - 81 (!) 30 (!) 84 %  06/12/18 0552 - - - (!) 110 (!) 38 (!) 81 %  06/12/18 0500 110/64 - - Marland Kitchen)  50 14 92 %  06/12/18 0400 103/60 - - (!) 56 14 (!) 89 %  06/12/18 0334 - 97.9 F (36.6 C) Axillary - - -  06/12/18 0300 108/69 - - (!) 51 13 93 %  06/12/18 0200 100/67 - - (!) 51 14 94 %  06/12/18 0131 - - - - - 93 %  06/12/18 0100 109/64 - - (!) 47 12 95 %  06/12/18 0000 106/71 - - (!) 54 13 93 %  06/11/18 2326 - - - (!) 104 (!) 27 (!) 83 %  06/11/18 2323 - 97.7 F (36.5 C) Oral - - -  06/11/18 2300 104/68 - - (!)  51 14 95 %  06/11/18 2200 95/60 - - (!) 54 13 96 %  06/11/18 2100 (!) 88/55 - - (!) 50 12 93 %  06/11/18 2000 109/65 - - 90 20 (!) 85 %  06/11/18 1947 - - - 85 16 90 %  06/11/18 1939 - 98.2 F (36.8 C) Oral - - -  06/11/18 1900 (!) 126/93 - - 99 (!) 32 (!) 82 %  06/11/18 1800 97/62 - - (!) 56 15 94 %  06/11/18 1700 133/82 - - 92 (!) 26 (!) 89 %  06/11/18 1600 117/70 - - 68 13 97 %  06/11/18 1500 (!) 93/56 97.7 F (36.5 C) Oral (!) 59 15 95 %  06/11/18 1412 (!) 100/53 - - 62 18 96 %  06/11/18 1400 (!) 100/53 - - 88 (!) 26 90 %    General: Chronically ill-appearing gentleman.  Developed shortness of breath even when talking.  Eyes:  no scleral icterus.  ENT:  There were no oropharyngeal lesions.  Neck was without thyromegaly.  Lymphatics:  Negative cervical, supraclavicular or axillary adenopathy.  Respiratory: Diminished breath sounds left base.  Scattered expiratory wheezes noted..  Cardiovascular:  Regular rate and rhythm, S1/S2, without murmur, rub or gallop.  There was no pedal edema.  GI:  abdomen was soft, flat, nontender, nondistended, without organomegaly.  Muscoloskeletal:  no spinal tenderness of palpation of vertebral spine.  Skin exam was without echymosis, petichae.  Neuro exam was nonfocal. Patient was alerted and oriented.  Attention was good.   Language was appropriate.  Mood was normal without depression.  Speech was not pressured.  Thought content was not tangential.     Lab Results  Component Value Date   WBC 13.0 (H) 06/12/2018   HGB 11.9 (L) 06/12/2018   HCT 36.2 (L) 06/12/2018   PLT 240 06/12/2018   GLUCOSE 125 (H) 06/12/2018   ALT 10 06/12/2018   AST 15 06/12/2018   NA 138 06/12/2018   K 4.4 06/12/2018   CL 103 06/12/2018   CREATININE 0.71 06/12/2018   BUN 17 06/12/2018   CO2 24 06/12/2018    Dg Chest 1 View  Result Date: 05/22/2018 CLINICAL DATA:  Follow-up bronchoscopy. EXAM: CHEST  1 VIEW COMPARISON:  05/12/2018 chest CT FINDINGS: No pneumothorax,  pleural fluid, or acute opacification. There is left more than right airspace disease and architectural distortion. Normal heart size accounting for technique. IMPRESSION: No acute finding after bronchoscopy. Electronically Signed   By: Monte Fantasia M.D.   On: 05/22/2018 10:22   Ct Chest Wo Contrast  Result Date: 06/18/2018 CLINICAL DATA:  Worsening shortness of breath and oxygen requirement. History of emphysema and pulmonary embolism. Recent bronchoscopy showing no malignancy but mild cellular atypia. EXAM: CT CHEST WITHOUT CONTRAST TECHNIQUE: Multidetector CT imaging of the chest was performed following the  standard protocol without IV contrast. COMPARISON:  Radiographs 06/17/2018 and 05/22/2018. CT 05/12/2018 and 03/18/2018. FINDINGS: Cardiovascular: Mild atherosclerosis of the aorta and great vessels again noted. No acute vascular findings on noncontrast imaging. The heart size is normal. There is no pericardial effusion. Mediastinum/Nodes: There are no enlarged mediastinal, hilar or axillary lymph nodes.Hilar assessment is limited by the lack of intravenous contrast, although the hilar contours appear unchanged. The thyroid gland, trachea and esophagus demonstrate no significant findings. Lungs/Pleura: There are trace dependent pleural effusions bilaterally which are stable. No pneumothorax. Severe centrilobular emphysema again noted. Superimposed bilateral airspace opacities show further worsening. In both lower lobes, there is consolidation with volume loss and bronchiectasis. There is worsening involvement in the right middle and left upper lobes with areas of patchy and bandlike consolidation. No dominant lung mass or endobronchial lesion identified. Upper abdomen: The visualized upper abdomen appears stable without acute findings. Musculoskeletal/Chest wall: There is no chest wall mass or suspicious osseous finding. IMPRESSION: 1. Further worsening of extensive bilateral airspace opacities  superimposed on emphysema and lower lobe bronchiectasis. Again, findings are most likely inflammatory in etiology, possibly due to chronic aspiration or organizing pneumonia. 2. No dominant mass or adenopathy. 3. Stable small bilateral pleural effusions. 4. Aortic Atherosclerosis (ICD10-I70.0) and Emphysema (ICD10-J43.9). Electronically Signed   By: Richardean Sale M.D.   On: 06/14/2018 20:42   Dg Chest Port 1 View  Result Date: 06/12/2018 CLINICAL DATA:  Chest tube in place. EXAM: PORTABLE CHEST 1 VIEW COMPARISON:  06/11/2018. FINDINGS: LEFT chest tube good position. Unchanged small LEFT pneumothorax, with lateral loculation. BILATERAL pulmonary opacities LEFT greater than RIGHT appear increased. Stable cardiomediastinal silhouette. IMPRESSION: 1. Stable small LEFT pneumothorax. LEFT chest tube good position. 2. Worsening aeration. Electronically Signed   By: Staci Righter M.D.   On: 06/12/2018 07:04   Dg Chest Port 1 View  Result Date: 06/11/2018 CLINICAL DATA:  Chest tube EXAM: PORTABLE CHEST 1 VIEW COMPARISON:  Yesterday FINDINGS: Interstitial coarsening and airspace opacity at the bases. Small apical and lateral left pneumothorax without convincing change. Stable apical chest tube positioning. Chest wall emphysema is stable to decreased. Normal heart size. Background COPD/emphysema. IMPRESSION: 1. Unchanged small left pneumothorax. 2. Unchanged airspace disease superimposed on chronic lung disease. Electronically Signed   By: Monte Fantasia M.D.   On: 06/11/2018 07:29   Dg Chest Port 1 View  Result Date: 06/19/2018 CLINICAL DATA:  Status post left VATS with open lung biopsy. EXAM: PORTABLE CHEST 1 VIEW COMPARISON:  06/09/2018 FINDINGS: Left chest tube present with small lateral and basilar pneumothorax after surgery. Severe underlying fibrotic lung disease again noted. No overt pulmonary edema or significant pleural effusions. The heart size is normal. IMPRESSION: Small postoperative left-sided  lateral and basilar pneumothorax. Single left chest tube is present. Electronically Signed   By: Aletta Edouard M.D.   On: 06/01/2018 17:02   Dg Chest Port 1 View  Result Date: 06/09/2018 CLINICAL DATA:  Acute hypoxic respiratory failure. EXAM: PORTABLE CHEST 1 VIEW COMPARISON:  05/31/2018 FINDINGS: No significant change in the parenchymal densities in the mid and lower left lung. Persistent interstitial densities along the right lower lung and periphery of the right lower chest. Cannot exclude a small right pleural effusion. Heart size is grossly stable. Underlying emphysema. Trachea is midline. IMPRESSION: Emphysema with stable parenchymal disease in both lungs, left side greater than right. Findings are suggestive for pneumonia. No significant change from the recent comparison examination. Possible small right pleural effusion. Electronically Signed  By: Markus Daft M.D.   On: 06/09/2018 08:25   Dg Chest Port 1 View  Result Date: 06/18/2018 CLINICAL DATA:  Cough EXAM: PORTABLE CHEST 1 VIEW COMPARISON:  05/22/2018 FINDINGS: No significant interval change in dense heterogeneous airspace opacity of the left mid and lower lung seen on prior radiographs and CT. Underlying emphysema. No new airspace opacity. IMPRESSION: No significant interval change in dense heterogeneous airspace opacity of the left mid and lower lung seen on prior radiographs and CT. Underlying emphysema. No new airspace opacity. Electronically Signed   By: Eddie Candle M.D.   On: 05/30/2018 10:50   Dg Swallow Func Speech Path  Result Date: 05/16/2018 Objective Swallowing Evaluation: Type of Study: Bedside Swallow Evaluation  Patient Details Name: MAXMILIAN TROSTEL MRN: [id number] Date of Birth: 12-16-1960 Today's Date: 05/16/2018 Time: SLP Start Time (ACUTE ONLY): 4315 -SLP Stop Time (ACUTE ONLY): 0914 SLP Time Calculation (min) (ACUTE ONLY): 37 min Past Medical History: Past Medical History: Diagnosis Date . Chronic back pain  . Emphysema  lung ([place])  . Tobacco abuse  Past Surgical History: Past Surgical History: Procedure Laterality Date . BACK SURGERY   HPI: Pt is a 58 yo male who is referred for OP MBS by his pulmonologist due to concern for aspiration. CT Chest on 05/12/18 showed persistent severe multilobar consolidative airspace disease with surrounding patchy ground-glass and tree-in-bud opacities. PMH includes: smoker, CHF, emphysema  Subjective: pt describes feeling like he needs to belch or regurgitate during meals Assessment / Plan / Recommendation CHL IP CLINICAL IMPRESSIONS 05/16/2018 Clinical Impression Pt has a mild oropharyngeal dysphagia with trace, silent aspiration of thin liquids. This appears to be primarily an issue of impaired timing, as pt triggers a swallow quickly but with the bolus reaching the pyriform sinuses before airway closure begins, allowing for very small amounts of thin liquids to enter the airway before the swallow. Although use of different strategies (chin tuck, bolus hold, breath hold) help to improve this timing, unfortunately he also has mild residue with thin liquids, which still is silently aspirated after the swallow. Since residue is not noted with any other consistency, and he otherwise appears to have adequate swallow efficiency, suspect that this may be related to possible esophageal component (esophagram completed after this study). When cued to cough he is able to expel penetrates and aspirates that remain just below the true vocal folds, but at times aspiration is deep enough that he cannot cough to clear it. No airway compromise occurs with nectar thick liquids or solids. Suspect that aspiration, although very trace in nature, is occurring throughout meals and throughout the day. In light of his ongoing respiratory issues, would favor being more conservative at this point in time as work up is still ongoing and pt is still symptomatic. This would include regular diet textures but use of nectar thick  liquids. Pt could have small, single sips of plain water per the water protocol, using a cough immediately post-swallow. Recommendations were reviewed with pt in detail and written information was provided. Would consider OP SLP f/u to maximize safety.  SLP Visit Diagnosis Dysphagia, pharyngeal phase (R13.13) Attention and concentration deficit following -- Frontal lobe and executive function deficit following -- Impact on safety and function Moderate aspiration risk   CHL IP TREATMENT RECOMMENDATION 05/16/2018 Treatment Recommendations Defer treatment plan to f/u with SLP   Prognosis 05/16/2018 Prognosis for Safe Diet Advancement Good Barriers to Reach Goals -- Barriers/Prognosis Comment -- CHL IP DIET RECOMMENDATION 05/16/2018 SLP  Diet Recommendations Regular solids;Nectar thick liquid;Other (Comment) Liquid Administration via Cup;Straw Medication Administration Whole meds with puree Compensations Slow rate;Small sips/bites;Other (Comment) Postural Changes Seated upright at 90 degrees;Remain semi-upright after after feeds/meals (Comment)   CHL IP OTHER RECOMMENDATIONS 05/16/2018 Recommended Consults -- Oral Care Recommendations Oral care BID;Other (Comment) Other Recommendations --   CHL IP FOLLOW UP RECOMMENDATIONS 05/16/2018 Follow up Recommendations Outpatient SLP   No flowsheet data found.     CHL IP ORAL PHASE 05/16/2018 Oral Phase WFL Oral - Pudding Teaspoon -- Oral - Pudding Cup -- Oral - Honey Teaspoon -- Oral - Honey Cup -- Oral - Nectar Teaspoon -- Oral - Nectar Cup -- Oral - Nectar Straw -- Oral - Thin Teaspoon -- Oral - Thin Cup -- Oral - Thin Straw -- Oral - Puree -- Oral - Mech Soft -- Oral - Regular -- Oral - Multi-Consistency -- Oral - Pill -- Oral Phase - Comment --  CHL IP PHARYNGEAL PHASE 05/16/2018 Pharyngeal Phase Impaired Pharyngeal- Pudding Teaspoon -- Pharyngeal -- Pharyngeal- Pudding Cup -- Pharyngeal -- Pharyngeal- Honey Teaspoon -- Pharyngeal -- Pharyngeal- Honey Cup -- Pharyngeal --  Pharyngeal- Nectar Teaspoon -- Pharyngeal -- Pharyngeal- Nectar Cup WFL Pharyngeal -- Pharyngeal- Nectar Straw WFL Pharyngeal -- Pharyngeal- Thin Teaspoon -- Pharyngeal -- Pharyngeal- Thin Cup Delayed swallow initiation-pyriform sinuses;Penetration/Aspiration before swallow;Penetration/Apiration after swallow;Pharyngeal residue - valleculae;Pharyngeal residue - pyriform;Compensatory strategies attempted (with notebox) Pharyngeal Material enters airway, passes BELOW cords without attempt by patient to eject out (silent aspiration) Pharyngeal- Thin Straw Delayed swallow initiation-pyriform sinuses;Penetration/Aspiration before swallow;Penetration/Apiration after swallow;Pharyngeal residue - valleculae;Pharyngeal residue - pyriform;Compensatory strategies attempted (with notebox) Pharyngeal Material enters airway, passes BELOW cords without attempt by patient to eject out (silent aspiration) Pharyngeal- Puree WFL Pharyngeal -- Pharyngeal- Mechanical Soft WFL Pharyngeal -- Pharyngeal- Regular -- Pharyngeal -- Pharyngeal- Multi-consistency -- Pharyngeal -- Pharyngeal- Pill -- Pharyngeal -- Pharyngeal Comment --  CHL IP CERVICAL ESOPHAGEAL PHASE 05/16/2018 Cervical Esophageal Phase WFL Pudding Teaspoon -- Pudding Cup -- Honey Teaspoon -- Honey Cup -- Nectar Teaspoon -- Nectar Cup -- Nectar Straw -- Thin Teaspoon -- Thin Cup -- Thin Straw -- Puree -- Mechanical Soft -- Regular -- Multi-consistency -- Pill -- Cervical Esophageal Comment -- Talbert Nan 05/16/2018, 9:57 AM  Germain Osgood Nix, M.A. CCC-SLP Acute Rehabilitation Services Pager [phone] Office [phone]             Dg Esophagus W Double Cm (hd)  Result Date: 05/16/2018 CLINICAL DATA:  Chronic cough and shortness of breath. Dependent airspace opacities in both lungs on CT. EXAM: ESOPHOGRAM / BARIUM SWALLOW / BARIUM TABLET STUDY TECHNIQUE: Combined double contrast and single contrast examination performed using effervescent crystals, thick barium  liquid, and thin barium liquid. The patient was observed with fluoroscopy swallowing a 13 mm barium sulphate tablet. FLUOROSCOPY TIME:  Fluoroscopy Time: 1 minutes and 6 seconds of low-dose pulsed fluoro Radiation Exposure Index (if provided by the fluoroscopic device): 7.6 mGy Number of Acquired Spot Images: None. COMPARISON:  Chest CT 05/12/2018. Images from modified speech study 05/16/2018. FINDINGS: The esophageal motility appears normal aside from mild tertiary contractions. No evidence of esophageal mass, ulceration or stricture. There is no significant hiatal hernia. No aspiration was demonstrated during this portion of the study. See separate report for details of the pharynx. There is no significant gastroesophageal reflux with the water siphon test. A 13 mm barium tablet was administered and passed without delay into the stomach. IMPRESSION: 1. Mildly prominent tertiary contraction/presbyesophagus without significant dysmotility. 2. No evidence  of stricture or focal mass lesion. 3. See separate report for dedicated evaluation of the pharynx. Electronically Signed   By: Richardean Sale M.D.   On: 05/16/2018 09:45   Dg C-arm Bronchoscopy  Result Date: 05/22/2018 C-ARM BRONCHOSCOPY: Fluoroscopy was utilized by the requesting physician.  No radiographic interpretation.   Dg Chest 1 View  Result Date: 05/22/2018 CLINICAL DATA:  Follow-up bronchoscopy. EXAM: CHEST  1 VIEW COMPARISON:  05/12/2018 chest CT FINDINGS: No pneumothorax, pleural fluid, or acute opacification. There is left more than right airspace disease and architectural distortion. Normal heart size accounting for technique. IMPRESSION: No acute finding after bronchoscopy. Electronically Signed   By: Monte Fantasia M.D.   On: 05/22/2018 10:22   Ct Chest Wo Contrast  Result Date: 06/02/2018 CLINICAL DATA:  Worsening shortness of breath and oxygen requirement. History of emphysema and pulmonary embolism. Recent bronchoscopy showing no  malignancy but mild cellular atypia. EXAM: CT CHEST WITHOUT CONTRAST TECHNIQUE: Multidetector CT imaging of the chest was performed following the standard protocol without IV contrast. COMPARISON:  Radiographs 05/25/2018 and 05/22/2018. CT 05/12/2018 and 03/18/2018. FINDINGS: Cardiovascular: Mild atherosclerosis of the aorta and great vessels again noted. No acute vascular findings on noncontrast imaging. The heart size is normal. There is no pericardial effusion. Mediastinum/Nodes: There are no enlarged mediastinal, hilar or axillary lymph nodes.Hilar assessment is limited by the lack of intravenous contrast, although the hilar contours appear unchanged. The thyroid gland, trachea and esophagus demonstrate no significant findings. Lungs/Pleura: There are trace dependent pleural effusions bilaterally which are stable. No pneumothorax. Severe centrilobular emphysema again noted. Superimposed bilateral airspace opacities show further worsening. In both lower lobes, there is consolidation with volume loss and bronchiectasis. There is worsening involvement in the right middle and left upper lobes with areas of patchy and bandlike consolidation. No dominant lung mass or endobronchial lesion identified. Upper abdomen: The visualized upper abdomen appears stable without acute findings. Musculoskeletal/Chest wall: There is no chest wall mass or suspicious osseous finding. IMPRESSION: 1. Further worsening of extensive bilateral airspace opacities superimposed on emphysema and lower lobe bronchiectasis. Again, findings are most likely inflammatory in etiology, possibly due to chronic aspiration or organizing pneumonia. 2. No dominant mass or adenopathy. 3. Stable small bilateral pleural effusions. 4. Aortic Atherosclerosis (ICD10-I70.0) and Emphysema (ICD10-J43.9). Electronically Signed   By: Richardean Sale M.D.   On: 06/08/2018 20:42   Dg Chest Port 1 View  Result Date: 06/12/2018 CLINICAL DATA:  Chest tube in place.  EXAM: PORTABLE CHEST 1 VIEW COMPARISON:  06/11/2018. FINDINGS: LEFT chest tube good position. Unchanged small LEFT pneumothorax, with lateral loculation. BILATERAL pulmonary opacities LEFT greater than RIGHT appear increased. Stable cardiomediastinal silhouette. IMPRESSION: 1. Stable small LEFT pneumothorax. LEFT chest tube good position. 2. Worsening aeration. Electronically Signed   By: Staci Righter M.D.   On: 06/12/2018 07:04   Dg Chest Port 1 View  Result Date: 06/11/2018 CLINICAL DATA:  Chest tube EXAM: PORTABLE CHEST 1 VIEW COMPARISON:  Yesterday FINDINGS: Interstitial coarsening and airspace opacity at the bases. Small apical and lateral left pneumothorax without convincing change. Stable apical chest tube positioning. Chest wall emphysema is stable to decreased. Normal heart size. Background COPD/emphysema. IMPRESSION: 1. Unchanged small left pneumothorax. 2. Unchanged airspace disease superimposed on chronic lung disease. Electronically Signed   By: Monte Fantasia M.D.   On: 06/11/2018 07:29   Dg Chest Port 1 View  Result Date: 05/27/2018 CLINICAL DATA:  Status post left VATS with open lung biopsy. EXAM: PORTABLE CHEST 1  VIEW COMPARISON:  06/09/2018 FINDINGS: Left chest tube present with small lateral and basilar pneumothorax after surgery. Severe underlying fibrotic lung disease again noted. No overt pulmonary edema or significant pleural effusions. The heart size is normal. IMPRESSION: Small postoperative left-sided lateral and basilar pneumothorax. Single left chest tube is present. Electronically Signed   By: Aletta Edouard M.D.   On: 06/16/2018 17:02   Dg Chest Port 1 View  Result Date: 06/09/2018 CLINICAL DATA:  Acute hypoxic respiratory failure. EXAM: PORTABLE CHEST 1 VIEW COMPARISON:  06/05/2018 FINDINGS: No significant change in the parenchymal densities in the mid and lower left lung. Persistent interstitial densities along the right lower lung and periphery of the right lower  chest. Cannot exclude a small right pleural effusion. Heart size is grossly stable. Underlying emphysema. Trachea is midline. IMPRESSION: Emphysema with stable parenchymal disease in both lungs, left side greater than right. Findings are suggestive for pneumonia. No significant change from the recent comparison examination. Possible small right pleural effusion. Electronically Signed   By: Markus Daft M.D.   On: 06/09/2018 08:25   Dg Chest Port 1 View  Result Date: 06/17/2018 CLINICAL DATA:  Cough EXAM: PORTABLE CHEST 1 VIEW COMPARISON:  05/22/2018 FINDINGS: No significant interval change in dense heterogeneous airspace opacity of the left mid and lower lung seen on prior radiographs and CT. Underlying emphysema. No new airspace opacity. IMPRESSION: No significant interval change in dense heterogeneous airspace opacity of the left mid and lower lung seen on prior radiographs and CT. Underlying emphysema. No new airspace opacity. Electronically Signed   By: Eddie Candle M.D.   On: 05/25/2018 10:50   Dg Swallow Func Speech Path  Result Date: 05/16/2018 Objective Swallowing Evaluation: Type of Study: Bedside Swallow Evaluation  Patient Details Name: FRASER BUSCHE MRN: [id number] Date of Birth: 22-Dec-1960 Today's Date: 05/16/2018 Time: SLP Start Time (ACUTE ONLY): 3419 -SLP Stop Time (ACUTE ONLY): 0914 SLP Time Calculation (min) (ACUTE ONLY): 37 min Past Medical History: Past Medical History: Diagnosis Date . Chronic back pain  . Emphysema lung ([place])  . Tobacco abuse  Past Surgical History: Past Surgical History: Procedure Laterality Date . BACK SURGERY   HPI: Pt is a 58 yo male who is referred for OP MBS by his pulmonologist due to concern for aspiration. CT Chest on 05/12/18 showed persistent severe multilobar consolidative airspace disease with surrounding patchy ground-glass and tree-in-bud opacities. PMH includes: smoker, CHF, emphysema  Subjective: pt describes feeling like he needs to belch or regurgitate  during meals Assessment / Plan / Recommendation CHL IP CLINICAL IMPRESSIONS 05/16/2018 Clinical Impression Pt has a mild oropharyngeal dysphagia with trace, silent aspiration of thin liquids. This appears to be primarily an issue of impaired timing, as pt triggers a swallow quickly but with the bolus reaching the pyriform sinuses before airway closure begins, allowing for very small amounts of thin liquids to enter the airway before the swallow. Although use of different strategies (chin tuck, bolus hold, breath hold) help to improve this timing, unfortunately he also has mild residue with thin liquids, which still is silently aspirated after the swallow. Since residue is not noted with any other consistency, and he otherwise appears to have adequate swallow efficiency, suspect that this may be related to possible esophageal component (esophagram completed after this study). When cued to cough he is able to expel penetrates and aspirates that remain just below the true vocal folds, but at times aspiration is deep enough that he cannot cough to clear it.  No airway compromise occurs with nectar thick liquids or solids. Suspect that aspiration, although very trace in nature, is occurring throughout meals and throughout the day. In light of his ongoing respiratory issues, would favor being more conservative at this point in time as work up is still ongoing and pt is still symptomatic. This would include regular diet textures but use of nectar thick liquids. Pt could have small, single sips of plain water per the water protocol, using a cough immediately post-swallow. Recommendations were reviewed with pt in detail and written information was provided. Would consider OP SLP f/u to maximize safety.  SLP Visit Diagnosis Dysphagia, pharyngeal phase (R13.13) Attention and concentration deficit following -- Frontal lobe and executive function deficit following -- Impact on safety and function Moderate aspiration risk   CHL IP  TREATMENT RECOMMENDATION 05/16/2018 Treatment Recommendations Defer treatment plan to f/u with SLP   Prognosis 05/16/2018 Prognosis for Safe Diet Advancement Good Barriers to Reach Goals -- Barriers/Prognosis Comment -- CHL IP DIET RECOMMENDATION 05/16/2018 SLP Diet Recommendations Regular solids;Nectar thick liquid;Other (Comment) Liquid Administration via Cup;Straw Medication Administration Whole meds with puree Compensations Slow rate;Small sips/bites;Other (Comment) Postural Changes Seated upright at 90 degrees;Remain semi-upright after after feeds/meals (Comment)   CHL IP OTHER RECOMMENDATIONS 05/16/2018 Recommended Consults -- Oral Care Recommendations Oral care BID;Other (Comment) Other Recommendations --   CHL IP FOLLOW UP RECOMMENDATIONS 05/16/2018 Follow up Recommendations Outpatient SLP   No flowsheet data found.     CHL IP ORAL PHASE 05/16/2018 Oral Phase WFL Oral - Pudding Teaspoon -- Oral - Pudding Cup -- Oral - Honey Teaspoon -- Oral - Honey Cup -- Oral - Nectar Teaspoon -- Oral - Nectar Cup -- Oral - Nectar Straw -- Oral - Thin Teaspoon -- Oral - Thin Cup -- Oral - Thin Straw -- Oral - Puree -- Oral - Mech Soft -- Oral - Regular -- Oral - Multi-Consistency -- Oral - Pill -- Oral Phase - Comment --  CHL IP PHARYNGEAL PHASE 05/16/2018 Pharyngeal Phase Impaired Pharyngeal- Pudding Teaspoon -- Pharyngeal -- Pharyngeal- Pudding Cup -- Pharyngeal -- Pharyngeal- Honey Teaspoon -- Pharyngeal -- Pharyngeal- Honey Cup -- Pharyngeal -- Pharyngeal- Nectar Teaspoon -- Pharyngeal -- Pharyngeal- Nectar Cup WFL Pharyngeal -- Pharyngeal- Nectar Straw WFL Pharyngeal -- Pharyngeal- Thin Teaspoon -- Pharyngeal -- Pharyngeal- Thin Cup Delayed swallow initiation-pyriform sinuses;Penetration/Aspiration before swallow;Penetration/Apiration after swallow;Pharyngeal residue - valleculae;Pharyngeal residue - pyriform;Compensatory strategies attempted (with notebox) Pharyngeal Material enters airway, passes BELOW cords without attempt  by patient to eject out (silent aspiration) Pharyngeal- Thin Straw Delayed swallow initiation-pyriform sinuses;Penetration/Aspiration before swallow;Penetration/Apiration after swallow;Pharyngeal residue - valleculae;Pharyngeal residue - pyriform;Compensatory strategies attempted (with notebox) Pharyngeal Material enters airway, passes BELOW cords without attempt by patient to eject out (silent aspiration) Pharyngeal- Puree WFL Pharyngeal -- Pharyngeal- Mechanical Soft WFL Pharyngeal -- Pharyngeal- Regular -- Pharyngeal -- Pharyngeal- Multi-consistency -- Pharyngeal -- Pharyngeal- Pill -- Pharyngeal -- Pharyngeal Comment --  CHL IP CERVICAL ESOPHAGEAL PHASE 05/16/2018 Cervical Esophageal Phase WFL Pudding Teaspoon -- Pudding Cup -- Honey Teaspoon -- Honey Cup -- Nectar Teaspoon -- Nectar Cup -- Nectar Straw -- Thin Teaspoon -- Thin Cup -- Thin Straw -- Puree -- Mechanical Soft -- Regular -- Multi-consistency -- Pill -- Cervical Esophageal Comment -- Talbert Nan 05/16/2018, 9:57 AM  Germain Osgood Nix, M.A. CCC-SLP Acute Rehabilitation Services Pager [phone] Office [phone]             Dg Esophagus W Double Cm (hd)  Result Date: 05/16/2018 CLINICAL DATA:  Chronic cough and  shortness of breath. Dependent airspace opacities in both lungs on CT. EXAM: ESOPHOGRAM / BARIUM SWALLOW / BARIUM TABLET STUDY TECHNIQUE: Combined double contrast and single contrast examination performed using effervescent crystals, thick barium liquid, and thin barium liquid. The patient was observed with fluoroscopy swallowing a 13 mm barium sulphate tablet. FLUOROSCOPY TIME:  Fluoroscopy Time: 1 minutes and 6 seconds of low-dose pulsed fluoro Radiation Exposure Index (if provided by the fluoroscopic device): 7.6 mGy Number of Acquired Spot Images: None. COMPARISON:  Chest CT 05/12/2018. Images from modified speech study 05/16/2018. FINDINGS: The esophageal motility appears normal aside from mild tertiary contractions. No evidence  of esophageal mass, ulceration or stricture. There is no significant hiatal hernia. No aspiration was demonstrated during this portion of the study. See separate report for details of the pharynx. There is no significant gastroesophageal reflux with the water siphon test. A 13 mm barium tablet was administered and passed without delay into the stomach. IMPRESSION: 1. Mildly prominent tertiary contraction/presbyesophagus without significant dysmotility. 2. No evidence of stricture or focal mass lesion. 3. See separate report for dedicated evaluation of the pharynx. Electronically Signed   By: Richardean Sale M.D.   On: 05/16/2018 09:45   Dg C-arm Bronchoscopy  Result Date: 05/22/2018 C-ARM BRONCHOSCOPY: Fluoroscopy was utilized by the requesting physician.  No radiographic interpretation.   PATHOLOGY:  Diagnosis 1. Lung, wedge biopsy/resection, left lower lobe - ADENOCARCINOMA WITH DIFFUSE AIRWAY SPREAD. SEE NOTE. 2. Lung, wedge biopsy/resection, left upper lobe - ADENOCARCINOMA WITH DIFFUSE AIRWAY SPREAD. SEE NOTE. Diagnosis Note 1. -2. Immunohistochemical stains will be performed to confirm primary pulmonary origin and reported in an addendum. The background lung parenchyma shows features of organizing acute injury, most likely from diffuse airway spread of the tumor. Dr. Lyndon Code has reviewed the case and concurs with the above interpretation. Dr. Vaughan Browner was notified on 06/11/2018. Jaquita Folds MD Pathologist, Electronic Signature (Case signed 06/11/2018)   Assessment:   This is a pleasant 58 year old Caucasian male with newly diagnosed non-small cell lung cancer, adenocarcinoma.  The patient presented with progressive dyspnea.  He was diagnosed in February 2020.  Plan:  I have discussed with the patient his recent diagnosis of lung cancer.  Dr. Maylon Peppers to see the patient and to determine any additional work-up.   Thank you for this referral.   Mikey Bussing, DNP, AGPCNP-BC,  AOCNP  ADDENDUM:  I was present during the interview, and confirmed the history as outlined above. I reviewed the patient's records in detail, including pulmonary clinic notes, hospitalization records, lab studies, imaging results and pathology reports. In summary, patient has had progressive dyspnea since late 2019, for which he underwent biopsy TBBx in late 04/2018, but unfortunately it showed only benign tissue. However, his oxygen requirement continued to worsen, and he has been admitted to the medical ICU for acute on chronic hypoxic respiratory failure.   At the time of interview, he was on HFNC 35-40L/min, 100% Fi,O2. Despite the high oxygen support, he remained very dyspneic and O2 saturation was only between85 to 89%.   I discussed the imaging studies and pathology results in detail with the patient. I independently reviewed the radiologic images and also showed the images to the patient. In summary, CT chest showed extensive bilateral airspace opacities that worsened significantly and rapidly over the past a few months, and the biopsy showed adenocarcinoma with diffuse airway spread, ie lymphangitic carcinomatosis. I explained to the patient that this represents a very aggressive form of lung cancer, and tends to be  refractory to treatment. Furthermore, given the rapid progression of the disease and the patient's progressive decline, he is not a candidate for any treatment, and the harms of treatment would likely outweigh any benefits. Finally, treatment for lung cancer would take several weeks to show any effect, and the patient likely only has a few weeks to live, if not shorter, due to the worsening hypoxic respiratory failure.   Therefore, in light of these challenging circumstances, I recommended the patient to consider palliative care/hospice to focus on his quality of life, not quantity. In addition, we briefly discussed his code status, given the new information, and the patient indicated  that he would like to talk to his brother before making a decision. Finally, the patient asked me to contact his brother Caleb Harper, and I spoke at length with the patient's brother regarding the diagnosis and prognosis. His brother expressed understanding, and will be in the hospital in early morning to further discuss this information with the patient.   At this time, I do not recommend any further work-up. I recommend consulting palliative care/hospice for ongoing goals of care discussion, and to assist transition toward hospice if the patient agrees.   Thank you for the consult. Please do not hesitate to contact me if there are any questions.  Tish Men, MD

## 2018-06-12 NOTE — Progress Notes (Signed)
NAME:  Caleb Harper, MRN:  [id number], DOB:  1960/08/18, LOS: 6 ADMISSION DATE:  06/19/2018, CONSULTATION DATE:  06/02/2018 REFERRING MD:  Sabra Heck, CHIEF COMPLAINT:  Dyspnea   Brief History   58 y/o male with diffuse parenchymal lung disease of uncertain etiology s/p VATS on 2/18, has high O2 requirements.   Past Medical History  PE, Emphysema, tobacco abuse  Significant Hospital Events   2/14 - Admitted to [facility] hospitalist service, cardiology and CCM consulted ./  Started high dose IV steroids - 1g/day 2/15 -  Feels "tad better" better. Down to 6L HFNC. EF improved in echo at admit . Concerns for VSD in echo but cards does not think so clinically. On IV heparin in case in a few days he needs to have surgical lung biopsy. PCT noral RVP pending 2/18 to OR for VATS  Consults:  PCCM Cardiology   Procedures:  None   Significant Diagnostic Tests:  Repeat tests 2/14- echo 65%, Possible VSD but cards doubts clinically 2/14 - urine strep - neg 2/14 -SCL 70 - neg -SSA/SSB - neg -ANA - neg - RF - 15.5 -DS DNA - neg -MPO- neg -PR3neg -anti GBM  - ESR 42 Echo bubble study 2/16 > no evidence of shunt.  Micro Data:  RVP (2/14) > neg Blood cx (2/14) >   Antimicrobials:  Cefepime 2/14 > 2/15 Vancomycin 2/14 > 2/15   Interim history/subjective:  Some dyspnea Wants chest tube out   Objective   Blood pressure 128/79, pulse (!) 51, temperature 97.8 F (36.6 C), temperature source Oral, resp. rate 16, height 5' 9"  (1.753 m), weight 52.9 kg, SpO2 91 %.    FiO2 (%):  [100 %] 100 %   Intake/Output Summary (Last 24 hours) at 06/12/2018 3212 Last data filed at 06/12/2018 0800 Gross per 24 hour  Intake 1255.38 ml  Output 1750 ml  Net -494.62 ml   Filed Weights   06/09/2018 1012 06/05/2018 1817  Weight: 53.5 kg 52.9 kg    Examination:  General:  Resting comfortably in bed HENT: NCAT OP clear PULM: Crackles bilaterally B, normal effort CV: RRR, no mgr GI: BS+, soft,  nontender MSK: normal bulk and tone Neuro: awake, alert, no distress, MAEW   Resolved Hospital Problem list     Assessment & Plan:  Newly diagnosed lung adenocarcinoma with emphysema  VATS biopsy on 2/18 DIffuse lung damage from malignancy, unclear how much remaing normal lung architecture exits; hoping that a newer cancer treatment may help quality of life but it is not clear that he has the time or enough normal lung left to benefit from that. > stop solumedrol > chest tube per TCTS > oncology consult today  Acute on chronic respiratory failure with hypoxemia > wean off O2 for O2 saturation > 88% > change from PCA to prn fentanyl  > out of bed if able  Pain control > prn fentanyl > prn toradol > prn oxycodone   COPD > Breo, [place]ru[place] > duoneb prn  Advance diet  Best practice:  Diet: regular diet Pain/Anxiety/Delirium protocol (if indicated): n/a VAP protocol (if indicated): n/a DVT prophylaxis: sub q hep GI prophylaxis: n/a Glucose control: monitor, SSI Mobility: out of bed as able today Code Status: full Family Communication: updated sister on 2/18 Disposition: remain in ICU  Labs   CBC: Recent Labs  Lab 06/05/2018 1020  06/08/18 0222 06/09/18 0259 06/20/2018 0243 06/12/2018 1724 06/11/18 0358 06/12/18 0657  WBC 13.7*   < > 17.7* 22.5*  19.3*  --  13.4* 13.0*  NEUTROABS 10.8*  --   --   --   --   --   --   --   HGB 11.7*   < > 11.6* 10.9* 10.7* 10.5* 11.3* 11.9*  HCT 36.4*   < > 35.6* 33.1* 32.4* 31.0* 35.5* 36.2*  MCV 92.9   < > 89.7 91.2 91.3  --  92.4 91.0  PLT 257   < > 269 293 286  --  284 240   < > = values in this interval not displayed.    Basic Metabolic Panel: Recent Labs  Lab 05/25/2018 1020  06/05/2018 2314  06/08/18 0222 06/09/18 0259 06/16/2018 0243 05/29/2018 1724 06/11/18 0358 06/12/18 0657  NA 139   < > 138   < > 137 138 139 138 137 138  K 3.6   < > 3.5   < > 3.7 4.0 4.0 4.0 3.9 4.4  CL 107  --  107   < > 108 107 106  --  105 103  CO2  23  --  20*   < > 23 22 25   --  26 24  GLUCOSE 93  --  109*   < > 141* 131* 122*  --  127* 125*  BUN 11  --  9   < > 15 19 17   --  18 17  CREATININE 1.00  --  0.91   < > 0.90 0.84 0.81  --  0.74 0.71  CALCIUM 8.4*  --  8.4*   < > 8.4* 8.4* 8.3*  --  8.3* 8.5*  MG 1.8  --  1.7  --   --   --   --   --   --   --    < > = values in this interval not displayed.   GFR: Estimated Creatinine Clearance: 76.2 mL/min (by C-G formula based on SCr of 0.71 mg/dL). Recent Labs  Lab 06/14/2018 1020 06/04/2018 1943 06/01/2018 2311 06/07/18 0302 06/08/18 0222 06/09/18 0259 05/27/2018 0243 06/11/18 0358 06/12/18 0657  PROCALCITON  --  <0.10  --  <0.10 <0.10  --   --   --   --   WBC 13.7*  --   --  14.1* 17.7* 22.5* 19.3* 13.4* 13.0*  LATICACIDVEN 2.0* 2.5* 1.3  --   --   --   --   --   --     Liver Function Tests: Recent Labs  Lab 06/18/2018 1020 06/09/18 0259 06/12/18 0657  AST 14* 15 15  ALT 10 12 10   ALKPHOS 67 54 46  BILITOT 0.8 0.4 0.6  PROT 5.6* 5.2* 5.1*  ALBUMIN 2.5* 2.2* 2.0*   No results for input(s): LIPASE, AMYLASE in the last 168 hours. No results for input(s): AMMONIA in the last 168 hours.  ABG    Component Value Date/Time   PHART 7.436 06/11/2018 0435   PCO2ART 37.6 06/11/2018 0435   PO2ART 71.6 (L) 06/11/2018 0435   HCO3 24.9 06/11/2018 0435   TCO2 27 05/31/2018 1724   O2SAT 94.1 06/11/2018 0435     Coagulation Profile: Recent Labs  Lab 06/17/2018 1020 06/09/18 0259  INR 1.18 1.26    Cardiac Enzymes: Recent Labs  Lab 06/01/2018 1020  TROPONINI <0.03    HbA1C: No results found for: HGBA1C  CBG: Recent Labs  Lab 06/11/18 1526 06/11/18 1938 06/11/18 2321 06/12/18 0332 06/12/18 0738  GLUCAP 99 103* 121* 125* 121*     Critical care time:  40 minutes    Roselie Awkward, MD [place] [facility] Pager: [phone] Cell: [phone] If no response, call [phone]

## 2018-06-12 NOTE — Progress Notes (Signed)
Pt remains on [place] at 100%, 25-35lpm with significant exertional dyspnea and slow recovery period.  For this reason pt a partial bath only, could not tolerate turn for complete linen change.  Will continue to monitor.

## 2018-06-12 NOTE — Progress Notes (Signed)
Patent just learned he has lung cancer.  He is afraid and family requested chaplain for him.  He is still trying to process all of this and in pain Nurse was waiting for him to see chaplain before giving him meds so he would be alert. I felt if he was ready for pain meds to go ahead and I would have prayer and he can request chaplain at another time because  chaplains are here 24-7 and he may rather talk another time.  He said yes that would be better.  Had prayer with him as he processes this new diagnosis and for peace in his heart and mind and for staff caring for him and for his family as they show love for him.  Conard Novak, Chaplain   06/12/18 1100  Clinical Encounter Type  Visited With Patient  Visit Type Initial;Spiritual support;Other (Comment) (lung cancer )  Referral From Family  Consult/Referral To Chaplain  Spiritual Encounters  Spiritual Needs Ritual;Emotional  Stress Factors  Patient Stress Factors Health changes;Other (Comment) (new diagnosis)

## 2018-06-13 ENCOUNTER — Inpatient Hospital Stay (HOSPITAL_COMMUNITY): Payer: BLUE CROSS/BLUE SHIELD

## 2018-06-13 DIAGNOSIS — Z7189 Other specified counseling: Secondary | ICD-10-CM

## 2018-06-13 LAB — GLUCOSE, CAPILLARY
GLUCOSE-CAPILLARY: 97 mg/dL (ref 70–99)
Glucose-Capillary: 105 mg/dL — ABNORMAL HIGH (ref 70–99)
Glucose-Capillary: 128 mg/dL — ABNORMAL HIGH (ref 70–99)
Glucose-Capillary: 122 mg/dL — ABNORMAL HIGH (ref 70–99)
Glucose-Capillary: 175 mg/dL — ABNORMAL HIGH (ref 70–99)
Glucose-Capillary: 168 mg/dL — ABNORMAL HIGH (ref 70–99)

## 2018-06-13 NOTE — Progress Notes (Addendum)
[address]       [place] [address]             [phone]      3 Days Post-Op Procedure(s) (LRB): VIDEO ASSISTED THORACOSCOPY (Left) LUNG BIOPSY (Left) Subjective: Dyspnea about the same or slightly better  Objective: Vital signs in last 24 hours: Temp:  [97.4 F (36.3 C)-98.3 F (36.8 C)] 97.6 F (36.4 C) (02/21 0700) Pulse Rate:  [49-88] 52 (02/21 0900) Cardiac Rhythm: Normal sinus rhythm;Sinus bradycardia (02/21 0400) Resp:  [9-23] 10 (02/21 0900) BP: (96-142)/(57-106) 134/74 (02/21 0900) SpO2:  [86 %-95 %] 94 % (02/21 0900) FiO2 (%):  [100 %] 100 % (02/21 0831)  Hemodynamic parameters for last 24 hours:    Intake/Output from previous day: 02/20 0701 - 02/21 0700 In: 1352.3 [P.O.:160; I.V.:1192.3] Out: 1105 [Urine:925; Chest Tube:180] Intake/Output this shift: Total I/O In: 99.1 [I.V.:99.1] Out: -   General appearance: alert, cooperative and no distress Heart: regular rate and rhythm and frequent extrasystole Lungs: coarse BS throughout Abdomen: benign Extremities: no edema Wound: incis healing well  Lab Results: Recent Labs    06/11/18 0358 06/12/18 0657  WBC 13.4* 13.0*  HGB 11.3* 11.9*  HCT 35.5* 36.2*  PLT 284 240   BMET:  Recent Labs    06/11/18 0358 06/12/18 0657  NA 137 138  K 3.9 4.4  CL 105 103  CO2 26 24  GLUCOSE 127* 125*  BUN 18 17  CREATININE 0.74 0.71  CALCIUM 8.3* 8.5*    PT/INR: No results for input(s): LABPROT, INR in the last 72 hours. ABG    Component Value Date/Time   PHART 7.436 06/11/2018 0435   HCO3 24.9 06/11/2018 0435   TCO2 27 05/30/2018 1724   O2SAT 94.1 06/11/2018 0435   CBG (last 3)  Recent Labs    06/12/18 2347 06/13/18 0330 06/13/18 0714  GLUCAP 118* 105* 128*    Meds Scheduled Meds: . acetaminophen  1,000 mg Oral Q6H   Or  . acetaminophen (TYLENOL) oral liquid 160 mg/5 mL  1,000 mg Oral Q6H  . bisacodyl  10 mg Oral Daily  . fluticasone furoate-vilanterol  1 puff  Inhalation Daily  . insulin aspart  0-24 Units Subcutaneous Q4H  . levalbuterol  0.63 mg Nebulization Q6H  . mouth rinse  15 mL Mouth Rinse BID  . methylPREDNISolone (SOLU-MEDROL) injection  60 mg Intravenous Q6H  . rivaroxaban  20 mg Oral Q supper  . senna-docusate  1 tablet Oral QHS  . tamsulosin  0.4 mg Oral Daily  . umeclidinium bromide  1 puff Inhalation Daily   Continuous Infusions: . dextrose 5 % and 0.45% NaCl 20 mL/hr at 06/13/18 0900  . potassium chloride     PRN Meds:.fentaNYL (SUBLIMAZE) injection, ipratropium-albuterol, ketorolac, ondansetron (ZOFRAN) IV, oxyCODONE, polyethylene glycol, potassium chloride, RESOURCE THICKENUP CLEAR, traMADol  Xrays Dg Chest Port 1 View  Result Date: 06/13/2018 CLINICAL DATA:  Chest tube EXAM: PORTABLE CHEST 1 VIEW COMPARISON:  Yesterday FINDINGS: Small left pneumothorax with increased gas at the base. Unchanged diffuse interstitial and airspace opacity. Small right pleural effusion, stable. Normal heart size. IMPRESSION: 1. Small left pneumothorax with mild increase at the base. 2. Unchanged interstitial and airspace opacity. Electronically Signed   By: Monte Fantasia M.D.   On: 06/13/2018 05:55   Dg Chest Port 1 View  Result Date: 06/12/2018 CLINICAL DATA:  Chest tube in place. EXAM: PORTABLE CHEST 1 VIEW COMPARISON:  06/11/2018. FINDINGS: LEFT chest  tube good position. Unchanged small LEFT pneumothorax, with lateral loculation. BILATERAL pulmonary opacities LEFT greater than RIGHT appear increased. Stable cardiomediastinal silhouette. IMPRESSION: 1. Stable small LEFT pneumothorax. LEFT chest tube good position. 2. Worsening aeration. Electronically Signed   By: Staci Righter M.D.   On: 06/12/2018 07:04    Assessment/Plan: S/P Procedure(s) (LRB): VIDEO ASSISTED THORACOSCOPY (Left) LUNG BIOPSY (Left)  1 stable , conts with mod chest tube drainage, 300 yesterday- cont to follow. May be a candidate for talc pleurodesis.  mild air leak with  cough Currently on H2O seal,  2 not a candidate for further oncology treatment- plans for moving towards hospice 3 cont medical management per pulm medicine   LOS: 7 days    John Giovanni [facility] 06/13/2018 Pager [phone]  Leave tube to water seal patient examined and medical record reviewed,agree with above note. Tharon Aquas Trigt III 06/13/2018

## 2018-06-13 NOTE — Care Management Note (Addendum)
Case Management Note  Patient Details  Name: ARCHER MOIST MRN: [id number] Date of Birth: 09-Oct-1960  Subjective/Objective:      Pt is s/p VATS - confirmed lung CA              Action/Plan:   PTA independent from home.  Pt on 2 liters baseline home O2 (however recently pt has had to use > 6 liters PTA.     Expected Discharge Date:  06/08/18               Expected Discharge Plan:  (Independent from home)  In-House Referral:  Clinical Social Work  Discharge planning Services  CM Consult  Post Acute Care Choice:    Choice offered to:     DME Arranged:    DME Agency:     HH Arranged:    [place] Agency:     Status of Service:     If discussed at [employer], dates discussed:    Additional Comments: 06/13/2018 Currently requiring HFNC 100%.  CM will continue to follow for discharge needs Maryclare Labrador, RN 06/13/2018, 3:43 PM

## 2018-06-13 NOTE — Progress Notes (Signed)
NAME:  Caleb Harper, MRN:  [id number], DOB:  1960/07/02, LOS: 7 ADMISSION DATE:  06/20/2018, CONSULTATION DATE:  05/30/2018 REFERRING MD:  Sabra Heck, CHIEF COMPLAINT:  Dyspnea   Brief History   58 y/o male with diffuse parenchymal lung disease of uncertain etiology s/p VATS on 2/18, has high O2 requirements.   Past Medical History  PE, Emphysema, tobacco abuse  Significant Hospital Events   2/14 - Admitted to [facility] hospitalist service, cardiology and CCM consulted ./  Started high dose IV steroids - 1g/day 2/15 -  Feels "tad better" better. Down to 6L HFNC. EF improved in echo at admit . Concerns for VSD in echo but cards does not think so clinically. On IV heparin in case in a few days he needs to have surgical lung biopsy. PCT noral RVP pending 2/18 to OR for VATS  Consults:  PCCM Cardiology   Procedures:  None   Significant Diagnostic Tests:  Repeat tests 2/14- echo 65%, Possible VSD but cards doubts clinically 2/14 - urine strep - neg 2/14 -SCL 70 - neg -SSA/SSB - neg -ANA - neg - RF - 15.5 -DS DNA - neg -MPO- neg -PR3neg -anti GBM  - ESR 42 Echo bubble study 2/16 > no evidence of shunt.  Micro Data:  RVP (2/14) > neg Blood cx (2/14) >   Antimicrobials:  Cefepime 2/14 > 2/15 Vancomycin 2/14 > 2/15   Interim history/subjective:   Wants chest tube out Slept OK this morning   Objective   Blood pressure 134/74, pulse (!) 52, temperature 97.6 F (36.4 C), temperature source Oral, resp. rate 10, height 5' 9"  (1.753 m), weight 52.9 kg, SpO2 94 %.    FiO2 (%):  [100 %] 100 %   Intake/Output Summary (Last 24 hours) at 06/13/2018 1103 Last data filed at 06/13/2018 0900 Gross per 24 hour  Intake 1351.42 ml  Output 1105 ml  Net 246.42 ml   Filed Weights   06/19/2018 1012 06/09/2018 1817  Weight: 53.5 kg 52.9 kg    Examination:  General:  Resting comfortably in bed HENT: NCAT OP clear PULM: Rhonchi left greater than right, normal effort CV: RRR, no  mgr GI: BS+, soft, nontender MSK: normal bulk and tone Neuro: awake, alert, no distress, MAEW   Resolved Hospital Problem list     Assessment & Plan:  Newly diagnosed lung adenocarcinoma with emphysema  VATS biopsy on 2/18 DIffuse lung damage from malignancy> discussed with oncology/pathology, will not benefit from cancer > move towards comfort measures > discussed hospice options with Coralyn Mark today, likely limited by oxygen needs > chest tube per TCTS  Acute on chronic respiratory failure with hypoxemia > wean off O2 for O2 saturation > 88% > out of bed if able  Pain control > prn fentanyl, toradol, oxycodone  COPD > Breo, Incruise > duoneb   Best practice:  Diet: regular diet Pain/Anxiety/Delirium protocol (if indicated): n/a VAP protocol (if indicated): n/a DVT prophylaxis: sub q hep GI prophylaxis: n/a Glucose control: monitor, SSI Mobility: out of bed as able today Code Status: full Family Communication: updated family bedside on 2/20 Disposition: remain in ICU  Labs   CBC: Recent Labs  Lab 06/12/2018 1020  06/08/18 0222 06/09/18 0259 06/13/2018 0243 06/01/2018 1724 06/11/18 0358 06/12/18 0657  WBC 13.7*   < > 17.7* 22.5* 19.3*  --  13.4* 13.0*  NEUTROABS 10.8*  --   --   --   --   --   --   --  HGB 11.7*   < > 11.6* 10.9* 10.7* 10.5* 11.3* 11.9*  HCT 36.4*   < > 35.6* 33.1* 32.4* 31.0* 35.5* 36.2*  MCV 92.9   < > 89.7 91.2 91.3  --  92.4 91.0  PLT 257   < > 269 293 286  --  284 240   < > = values in this interval not displayed.    Basic Metabolic Panel: Recent Labs  Lab 06/20/2018 1020  06/14/2018 2314  06/08/18 0222 06/09/18 0259 06/14/2018 0243 06/19/2018 1724 06/11/18 0358 06/12/18 0657  NA 139   < > 138   < > 137 138 139 138 137 138  K 3.6   < > 3.5   < > 3.7 4.0 4.0 4.0 3.9 4.4  CL 107  --  107   < > 108 107 106  --  105 103  CO2 23  --  20*   < > 23 22 25   --  26 24  GLUCOSE 93  --  109*   < > 141* 131* 122*  --  127* 125*  BUN 11  --  9   < >  15 19 17   --  18 17  CREATININE 1.00  --  0.91   < > 0.90 0.84 0.81  --  0.74 0.71  CALCIUM 8.4*  --  8.4*   < > 8.4* 8.4* 8.3*  --  8.3* 8.5*  MG 1.8  --  1.7  --   --   --   --   --   --   --    < > = values in this interval not displayed.   GFR: Estimated Creatinine Clearance: 76.2 mL/min (by C-G formula based on SCr of 0.71 mg/dL). Recent Labs  Lab 06/12/2018 1020 05/29/2018 1943 06/01/2018 2311 06/07/18 0302 06/08/18 0222 06/09/18 0259 06/14/2018 0243 06/11/18 0358 06/12/18 0657  PROCALCITON  --  <0.10  --  <0.10 <0.10  --   --   --   --   WBC 13.7*  --   --  14.1* 17.7* 22.5* 19.3* 13.4* 13.0*  LATICACIDVEN 2.0* 2.5* 1.3  --   --   --   --   --   --     Liver Function Tests: Recent Labs  Lab 06/19/2018 1020 06/09/18 0259 06/12/18 0657  AST 14* 15 15  ALT 10 12 10   ALKPHOS 67 54 46  BILITOT 0.8 0.4 0.6  PROT 5.6* 5.2* 5.1*  ALBUMIN 2.5* 2.2* 2.0*   No results for input(s): LIPASE, AMYLASE in the last 168 hours. No results for input(s): AMMONIA in the last 168 hours.  ABG    Component Value Date/Time   PHART 7.436 06/11/2018 0435   PCO2ART 37.6 06/11/2018 0435   PO2ART 71.6 (L) 06/11/2018 0435   HCO3 24.9 06/11/2018 0435   TCO2 27 06/03/2018 1724   O2SAT 94.1 06/11/2018 0435     Coagulation Profile: Recent Labs  Lab 06/09/2018 1020 06/09/18 0259  INR 1.18 1.26    Cardiac Enzymes: Recent Labs  Lab 06/09/2018 1020  TROPONINI <0.03    HbA1C: No results found for: HGBA1C  CBG: Recent Labs  Lab 06/12/18 1516 06/12/18 2031 06/12/18 2347 06/13/18 0330 06/13/18 0714  GLUCAP 115* 143* 118* 105* 128*     Critical care time: 31 minutes    Roselie Awkward, MD [place] [facility] Pager: [phone] Cell: [phone] If no response, call [phone]

## 2018-06-14 ENCOUNTER — Inpatient Hospital Stay (HOSPITAL_COMMUNITY): Payer: BLUE CROSS/BLUE SHIELD

## 2018-06-14 DIAGNOSIS — Z9689 Presence of other specified functional implants: Secondary | ICD-10-CM

## 2018-06-14 DIAGNOSIS — Z515 Encounter for palliative care: Secondary | ICD-10-CM

## 2018-06-14 LAB — GLUCOSE, CAPILLARY
Glucose-Capillary: 102 mg/dL — ABNORMAL HIGH (ref 70–99)
Glucose-Capillary: 112 mg/dL — ABNORMAL HIGH (ref 70–99)
Glucose-Capillary: 139 mg/dL — ABNORMAL HIGH (ref 70–99)
Glucose-Capillary: 102 mg/dL — ABNORMAL HIGH (ref 70–99)

## 2018-06-14 LAB — TROPONIN I: Troponin I: 0.04 ng/mL (ref <0.03)

## 2018-06-14 MED ORDER — FENTANYL CITRATE (PF) 100 MCG/2ML IJ SOLN
100.0000 ug | Freq: Once | INTRAMUSCULAR | Status: AC
  Administered 2018-06-14: 100 ug via INTRAVENOUS

## 2018-06-14 MED ORDER — GLYCERIN (LAXATIVE) 2.1 G RE SUPP
1.0000 | RECTAL | Status: DC | PRN
  Filled 2018-06-14: qty 1

## 2018-06-14 MED ORDER — ALUM & MAG HYDROXIDE-SIMETH 200-200-20 MG/5ML PO SUSP
30.0000 mL | Freq: Once | ORAL | Status: AC
  Administered 2018-06-14: 30 mL via ORAL
  Filled 2018-06-14 (×2): qty 30

## 2018-06-14 MED ORDER — LIDOCAINE VISCOUS HCL 2 % MT SOLN
15.0000 mL | Freq: Once | OROMUCOSAL | Status: AC
  Administered 2018-06-14: 15 mL via ORAL
  Filled 2018-06-14: qty 15

## 2018-06-14 MED ORDER — MORPHINE SULFATE (PF) 4 MG/ML IV SOLN
4.0000 mg | INTRAVENOUS | Status: DC | PRN
  Administered 2018-06-15 (×2): 4 mg via INTRAVENOUS
  Filled 2018-06-14 (×2): qty 1

## 2018-06-14 MED ORDER — OXYCODONE HCL 5 MG PO TABS
10.0000 mg | ORAL_TABLET | Freq: Four times a day (QID) | ORAL | Status: DC
  Administered 2018-06-14 – 2018-06-15 (×3): 10 mg via ORAL
  Filled 2018-06-14 (×3): qty 2

## 2018-06-14 MED ORDER — LORAZEPAM 2 MG/ML IJ SOLN: 0.5000 mg | Freq: Four times a day (QID) | INTRAMUSCULAR | Status: DC | PRN

## 2018-06-14 MED ORDER — OXYMETAZOLINE HCL 0.05 % NA SOLN
1.0000 | Freq: Two times a day (BID) | NASAL | Status: DC
  Administered 2018-06-15: 1 via NASAL
  Filled 2018-06-14: qty 30

## 2018-06-14 MED ORDER — LORAZEPAM 0.5 MG PO TABS
0.5000 mg | ORAL_TABLET | Freq: Two times a day (BID) | ORAL | Status: DC
  Administered 2018-06-14: 0.5 mg via ORAL
  Filled 2018-06-14: qty 1

## 2018-06-14 MED ORDER — MORPHINE SULFATE (PF) 4 MG/ML IV SOLN: 4.0000 mg | INTRAVENOUS | Status: DC | PRN

## 2018-06-14 NOTE — Progress Notes (Signed)
[place] surgery to inform them about pt's Xray showing pneumothorax to see if they would like to change anything about chest tube. Orders to keep chest tube as is on water seal.  1618 pt started having left side chest pain. Assessed chest tube site; fremitus noted and dressing was dry and intact. Called CCM orders to give a GI cocktail do a stat  Xray, stat EKG, draw troponin.

## 2018-06-14 NOTE — Progress Notes (Signed)
[address]       [place] [address]             [phone]      4 Days Post-Op Procedure(s) (LRB): VIDEO ASSISTED THORACOSCOPY (Left) LUNG BIOPSY (Left) Subjective: Chest tube drainage slowly decreasing CXR is stable in appearance  Objective: Vital signs in last 24 hours: Temp:  [97.5 F (36.4 C)-97.8 F (36.6 C)] 97.8 F (36.6 C) (02/22 1515) Pulse Rate:  [51-109] 88 (02/22 1700) Cardiac Rhythm: Atrial paced (02/22 1600) Resp:  [11-30] 26 (02/22 1700) BP: (96-151)/(59-92) 126/79 (02/22 1700) SpO2:  [86 %-98 %] 89 % (02/22 1700) FiO2 (%):  [100 %] 100 % (02/22 1600) Weight:  [57.5 kg] 57.5 kg (02/22 0500)  Hemodynamic parameters for last 24 hours:    Intake/Output from previous day: 02/21 0701 - 02/22 0700 In: 1190.8 [I.V.:1190.8] Out: 1300 [Urine:1090; Chest Tube:210] Intake/Output this shift: Total I/O In: 241.1 [I.V.:241.1] Out: 540 [Urine:500; Chest Tube:40]  On bedside commode, appears dyspneic but not significantly changed from previous  Lab Results: Recent Labs    06/12/18 0657  WBC 13.0*  HGB 11.9*  HCT 36.2*  PLT 240   BMET:  Recent Labs    06/12/18 0657  NA 138  K 4.4  CL 103  CO2 24  GLUCOSE 125*  BUN 17  CREATININE 0.71  CALCIUM 8.5*    PT/INR: No results for input(s): LABPROT, INR in the last 72 hours. ABG    Component Value Date/Time   PHART 7.436 06/11/2018 0435   HCO3 24.9 06/11/2018 0435   TCO2 27 05/29/2018 1724   O2SAT 94.1 06/11/2018 0435   CBG (last 3)  Recent Labs    06/14/18 0729 06/14/18 1120 06/14/18 1513  GLUCAP 112* 139* 102*    Meds Scheduled Meds: . acetaminophen  1,000 mg Oral Q6H   Or  . acetaminophen (TYLENOL) oral liquid 160 mg/5 mL  1,000 mg Oral Q6H  . bisacodyl  10 mg Oral Daily  . fluticasone furoate-vilanterol  1 puff Inhalation Daily  . levalbuterol  0.63 mg Nebulization Q6H  . LORazepam  0.5 mg Oral BID  . mouth rinse  15 mL Mouth Rinse BID  . oxyCODONE  10 mg  Oral Q6H  . oxymetazoline  1 spray Each Nare BID  . senna-docusate  1 tablet Oral QHS  . tamsulosin  0.4 mg Oral Daily  . umeclidinium bromide  1 puff Inhalation Daily   Continuous Infusions: . potassium chloride     PRN Meds:.Glycerin (Adult), ipratropium-albuterol, ketorolac, LORazepam, morphine injection, ondansetron (ZOFRAN) IV, polyethylene glycol, potassium chloride, RESOURCE THICKENUP CLEAR  Xrays Dg Chest Port 1 View  Result Date: 06/14/2018 CLINICAL DATA:  Acute respiratory failure, hypoxia EXAM: PORTABLE CHEST 1 VIEW COMPARISON:  06/14/2018 FINDINGS: Left chest tube remains in place, unchanged. Left apical and lateral pneumothorax again noted, stable. Subcutaneous emphysema within the left chest wall slightly increased. Bilateral mid and lower lung airspace opacities with small effusions, unchanged. IMPRESSION: Stable exam with bilateral airspace disease and small effusions. Stable small left pneumothorax with left chest tube in stable position. Electronically Signed   By: Rolm Baptise M.D.   On: 06/14/2018 16:44   Dg Chest Port 1 View  Result Date: 06/14/2018 CLINICAL DATA:  Follow-up chest surgery EXAM: PORTABLE CHEST 1 VIEW COMPARISON:  06/13/2018 FINDINGS: Left chest tube remains in place extending into the apex. Left pneumothorax in the apex, laterally, and inferior to the lung is unchanged. No  midline shift Moderate bibasilar airspace disease unchanged. Small right effusion unchanged. IMPRESSION: Mild  left pneumothorax stable Moderate bibasilar airspace disease and small right pleural effusion stable Electronically Signed   By: Franchot Gallo M.D.   On: 06/14/2018 08:11   Dg Chest Port 1 View  Result Date: 06/13/2018 CLINICAL DATA:  Chest tube EXAM: PORTABLE CHEST 1 VIEW COMPARISON:  Yesterday FINDINGS: Small left pneumothorax with increased gas at the base. Unchanged diffuse interstitial and airspace opacity. Small right pleural effusion, stable. Normal heart size. IMPRESSION:  1. Small left pneumothorax with mild increase at the base. 2. Unchanged interstitial and airspace opacity. Electronically Signed   By: Monte Fantasia M.D.   On: 06/13/2018 05:55    Assessment/Plan: S/P Procedure(s) (LRB): VIDEO ASSISTED THORACOSCOPY (Left) LUNG BIOPSY (Left)  1 CT -drainage decreasing, + large air leak- leave as is for now. CXR is stable 2 medical management per primary   LOS: 8 days    Caleb Harper [facility] 06/14/2018 Pager [phone]

## 2018-06-14 NOTE — Progress Notes (Addendum)
NAME:  Caleb Harper, MRN:  [id number], DOB:  December 26, 1960, LOS: 8 ADMISSION DATE:  05/31/2018, CONSULTATION DATE:  06/04/2018 REFERRING MD:  Sabra Heck, CHIEF COMPLAINT:  Dyspnea   Brief History   58 y/o male with diffuse parenchymal lung disease of uncertain etiology s/p VATS on 2/18, has high O2 requirements.   Past Medical History  PE, Emphysema, tobacco abuse  Significant Hospital Events   2/14 - Admitted to [facility] hospitalist service, cardiology and CCM consulted ./  Started high dose IV steroids - 1g/day 2/15 -  Feels "tad better" better. Down to 6L HFNC. EF improved in echo at admit . Concerns for VSD in echo but cards does not think so clinically. On IV heparin in case in a few days he needs to have surgical lung biopsy. PCT noral RVP pending 2/18 to OR for VATS  Consults:  PCCM Cardiology   Procedures:  None   Significant Diagnostic Tests:  Repeat tests 2/14- echo 65%, Possible VSD but cards doubts clinically 2/14 - urine strep - neg 2/14 -SCL 70 - neg -SSA/SSB - neg -ANA - neg - RF - 15.5 -DS DNA - neg -MPO- neg -PR3neg -anti GBM  - ESR 42 Echo bubble study 2/16 > no evidence of shunt.  Micro Data:  RVP (2/14) > neg Blood cx (2/14) >   Antimicrobials:  Cefepime 2/14 > 2/15 Vancomycin 2/14 > 2/15   Interim history/subjective:   Slept OK Some pain at chest tube site   Objective   Blood pressure 136/77, pulse (!) 51, temperature (!) 97.5 F (36.4 C), temperature source Oral, resp. rate 13, height 5' 9" (1.753 m), weight 57.5 kg, SpO2 97 %.    FiO2 (%):  [100 %] 100 %   Intake/Output Summary (Last 24 hours) at 06/14/2018 1054 Last data filed at 06/14/2018 0600 Gross per 24 hour  Intake 995.03 ml  Output 1300 ml  Net -304.97 ml   Filed Weights   05/25/2018 1012 05/29/2018 1817 06/14/18 0500  Weight: 53.5 kg 52.9 kg 57.5 kg    Examination:  General:  Resting comfortably in bed HENT: NCAT OP clear PULM: rhonchi on R B, normal effort CV: RRR, no  mgr GI: BS+, soft, nontender MSK: normal bulk and tone Neuro: awake, alert, no distress, MAEW    Resolved Hospital Problem list     Assessment & Plan:  Newly diagnosed lung adenocarcinoma with emphysema  VATS biopsy on 2/18 DIffuse lung damage from malignancy> discussed with oncology/pathology, will not benefit from cancer > continue comfort measures > case management consulted for inpatient hospice care > chest tube with TCTS  Acute on chronic respiratory failure with hypoxemia > wean off O2 to maintain O2 saturation > 88%  Pain control > prn fentanyl, toradol, oxycodone  COPD > Breo, Incruise > duoneb  Advance diet  Consult palliative medicine Consult case management to see if he can go to an inpatient hospice facility, but I don't think that will be feasible with his high O2 needs.  Best practice:  Diet: regular diet Pain/Anxiety/Delirium protocol (if indicated): n/a VAP protocol (if indicated): n/a DVT prophylaxis: sub q hep GI prophylaxis: n/a Glucose control: monitor, SSI Mobility: out of bed as able today Code Status: full Family Communication: updated bedside on 2/22 Disposition:  If feasible, move to progressive care, PCCM service  Labs   CBC: Recent Labs  Lab 06/08/18 0222 06/09/18 0259 06/09/2018 0243 06/11/2018 1724 06/11/18 0358 06/12/18 0657  WBC 17.7* 22.5* 19.3*  --  13.4*  13.0*  HGB 11.6* 10.9* 10.7* 10.5* 11.3* 11.9*  HCT 35.6* 33.1* 32.4* 31.0* 35.5* 36.2*  MCV 89.7 91.2 91.3  --  92.4 91.0  PLT 269 293 286  --  284 742    Basic Metabolic Panel: Recent Labs  Lab 06/08/18 0222 06/09/18 0259 05/31/2018 0243 06/04/2018 1724 06/11/18 0358 06/12/18 0657  NA 137 138 139 138 137 138  K 3.7 4.0 4.0 4.0 3.9 4.4  CL 108 107 106  --  105 103  CO2 _0 --  26 24  GLUCOSE 141* 131* 122*  --  127* 125*  BUN _1 --  18 17  CREATININE 0.90 0.84 0.81  --  0.74 0.71  CALCIUM 8.4* 8.4* 8.3*  --  8.3* 8.5*   GFR: Estimated Creatinine  Clearance: 82.9 mL/min (by C-G formula based on SCr of 0.71 mg/dL). Recent Labs  Lab 06/08/18 0222 06/09/18 0259 05/24/2018 0243 06/11/18 0358 06/12/18 0657  PROCALCITON <0.10  --   --   --   --   WBC 17.7* 22.5* 19.3* 13.4* 13.0*    Liver Function Tests: Recent Labs  Lab 06/09/18 0259 06/12/18 0657  AST 15 15  ALT 12 10  ALKPHOS 54 46  BILITOT 0.4 0.6  PROT 5.2* 5.1*  ALBUMIN 2.2* 2.0*   No results for input(s): LIPASE, AMYLASE in the last 168 hours. No results for input(s): AMMONIA in the last 168 hours.  ABG    Component Value Date/Time   PHART 7.436 06/11/2018 0435   PCO2ART 37.6 06/11/2018 0435   PO2ART 71.6 (L) 06/11/2018 0435   HCO3 24.9 06/11/2018 0435   TCO2 27 06/03/2018 1724   O2SAT 94.1 06/11/2018 0435     Coagulation Profile: Recent Labs  Lab 06/09/18 0259  INR 1.26    Cardiac Enzymes: No results for input(s): CKTOTAL, CKMB, CKMBINDEX, TROPONINI in the last 168 hours.  HbA1C: No results found for: HGBA1C  CBG: Recent Labs  Lab 06/13/18 1526 06/13/18 1942 06/13/18 2355 06/14/18 0419 06/14/18 0729  GLUCAP 175* 168* 97 102* 112*        Roselie Awkward, MD [place] [facility] Pager: [phone] Cell: [phone] If no response, call [phone]

## 2018-06-14 NOTE — Consult Note (Addendum)
Consultation Note Date: 06/14/2018   Patient Name: Caleb Harper  DOB: 13-Sep-1960  MRN: [id number]  Age / Sex: 58 y.o., male  PCP: [name], L.Marlou Sa, MD Referring Physician: Juanito Doom, MD  Reason for Consultation: Hospice Evaluation, Non pain symptom management, Pain control and Psychosocial/spiritual support  HPI/Patient Profile: 58 y.o. male  with past medical history of tobacco use, COPD and back pain who was admitted on 06/16/2018 with acute on chronic respiratory failure.  Initially he was felt to have an idiopathic pulmonary syndrome.  He underwent VATS with biopsy on February 18.  Pathology revealed adenocarcinoma on 2/20.  Because of the extent of his lung disease it is felt that oncological treatment would not be of benefit to him.   He has a chest tube in place for pneumothorax.  Dr. Lake Bells has had extensive conversations with the patient and his family.  He is DNR.  Dr. Lake Bells has explained to him that he will likely die in the hospital.  Of course the patient is having difficulty processing his prognosis.  Clinical Assessment and Goals of Care:  I have reviewed medical records including EPIC notes, labs and imaging, received report from Dr. Lake Bells and the bedside RN, assessed the patient and talked with him about symptoms, and next steps.  There was no family at bedside.  I introduced Palliative Medicine as specialized medical care for people living with serious illness. It focuses on providing relief from the symptoms and stress of a serious illness. The goal is to improve quality of life for both the patient and the family.  We discussed a brief life review of the patient.  Caleb Harper has been married twice.  He has 5 stepchildren.  He has a very close relationship with his brother.  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  I  attempted to elicit values and goals of care important to the patient.  While Caleb Harper has accepted his diagnosis and prognosis, right now he is still focused on living.  He complains of significant pain at the chest tube site and shortness of breath.  While sitting with Caleb Harper he cries easily and seems to have a significant amount of anxiety (understandably).  Hospice house was considered and will be offered should Caleb Harper become eligible, but at this point being on 35 L of high flow nasal cannula he is unable to leave the hospital.  From a symptom perspective Caleb Harper complained of constipation, chest tube pain, shortness of breath, and congestion.  He felt like his primary issue with constipation was that he is unable to have a bowel movement while laying down.    Questions and concerns were addressed.     Primary Decision Maker:  PATIENT    SUMMARY OF RECOMMENDATIONS     Scheduled p.o. Ativan added for anxiety  As needed Ativan added for breakthrough anxiety or insomnia.  Placed on regular diet.  Assisted RN and putting patient on bedside commode to have a bowel  movement.  Added scheduled oxycodone every 6 hours along with as needed morphine for pain or dyspnea.  Afrin for congestion  Caleb Harper's diagnosis and situation has come about so fast that he simply has not had time to adjust.  He is mentally very alert.  He is able to get out of bed with assistance.  He is not actively dying.  He has good time left--- so long as he is on high flow nasal cannula.  My goal is to decrease his anxiety and pain while giving him a day or 2 to accept what is happening.  A plan needs to be made to shift completely to comfort care weaning him from his high flow nasal cannula.  Hopefully during that time we will be able to discontinue the chest tube to reduce his pain.  Code Status/Advance Care Planning:  DNR   Symptom Management:   As above   Palliative Prophylaxis:   Frequent Pain  Assessment  Psycho-social/Spiritual:   Desire for further Chaplaincy support: Yes  Prognosis: Days    Discharge Planning: Anticipated Hospital Death versus possible discharge to hospice house if his oxygen requirement lessens      Primary Diagnoses: Present on Admission: **None**   I have reviewed the medical record, interviewed the patient and family, and examined the patient. The following aspects are pertinent.  Past Medical History:  Diagnosis Date  . Chronic back pain   . Emphysema lung ([place])   . Tobacco abuse    Social History   Socioeconomic History  . Marital status: Divorced    Spouse name: Not on file  . Number of children: Not on file  . Years of education: Not on file  . Highest education level: Not on file  Occupational History  . Not on file  Social Needs  . Financial resource strain: Not on file  . Food insecurity:    Worry: Not on file    Inability: Not on file  . Transportation needs:    Medical: Not on file    Non-medical: Not on file  Tobacco Use  . Smoking status: Former Smoker    Packs/day: 1.00    Years: 30.00    Pack years: 30.00    Types: Cigarettes    Last attempt to quit: 02/12/2018    Years since quitting: 0.3  . Smokeless tobacco: Never Used  Substance and Sexual Activity  . Alcohol use: Not Currently    Comment: Reports that he used to "binge drink."  Stopped drinking alcohol in fall 2019.  . Drug use: No  . Sexual activity: Not on file  Lifestyle  . Physical activity:    Days per week: Not on file    Minutes per session: Not on file  . Stress: Not on file  Relationships  . Social connections:    Talks on phone: Not on file    Gets together: Not on file    Attends religious service: Not on file    Active member of club or organization: Not on file    Attends meetings of clubs or organizations: Not on file    Relationship status: Not on file  Other Topics Concern  . Not on file  Social History Narrative  . Not on  file   Family History  Problem Relation Age of Onset  . Breast cancer Mother   . COPD Father   . Lymphoma Father    Scheduled Meds: . acetaminophen  1,000 mg Oral Q6H   Or  .  acetaminophen (TYLENOL) oral liquid 160 mg/5 mL  1,000 mg Oral Q6H  . bisacodyl  10 mg Oral Daily  . fluticasone furoate-vilanterol  1 puff Inhalation Daily  . levalbuterol  0.63 mg Nebulization Q6H  . LORazepam  0.5 mg Oral BID  . mouth rinse  15 mL Mouth Rinse BID  . oxyCODONE  10 mg Oral Q6H  . senna-docusate  1 tablet Oral QHS  . tamsulosin  0.4 mg Oral Daily  . umeclidinium bromide  1 puff Inhalation Daily   Continuous Infusions: . potassium chloride     PRN Meds:.Glycerin (Adult), ipratropium-albuterol, ketorolac, LORazepam, morphine injection, ondansetron (ZOFRAN) IV, polyethylene glycol, potassium chloride, RESOURCE THICKENUP CLEAR Allergies  Allergen Reactions  . Chantix [Varenicline Tartrate] Nausea And Vomiting   Review of Systems positive for constipation, chest pain, insomnia, anxiety, excessive sputum  Physical Exam  Thin, well-developed, alert male who is coherent and cooperative Irregular and tachycardic On 30 L high flow with no distress while at rest.  However he becomes very short of breath with any movement Abdomen thin, soft, nontender Lower extremities no edema, able to bear weight with assistance  Vital Signs: BP 126/79   Pulse 88   Temp 97.8 F (36.6 C) (Oral)   Resp (!) 26   Ht 5\' 9"  (1.753 m)   Wt 57.5 kg   SpO2 (!) 89%   BMI 18.72 kg/m  Pain Scale: 0-10 POSS *See Group Information*: S-Acceptable,Sleep, easy to arouse Pain Score: 6    SpO2: SpO2: (!) 89 % O2 Device:SpO2: (!) 89 % O2 Flow Rate: .O2 Flow Rate (L/min): 35 L/min  IO: Intake/output summary:   Intake/Output Summary (Last 24 hours) at 06/14/2018 1824 Last data filed at 06/14/2018 1600 Gross per 24 hour  Intake 890.89 ml  Output 1240 ml  Net -349.11 ml    LBM: Last BM Date: 06/14/18 Baseline  Weight: Weight: 53.5 kg Most recent weight: Weight: 57.5 kg     Palliative Assessment/Data: 40%     Time In: 5 PM Time Out: 6:40 PM Time Total: 100 minutes Greater than 50%  of this time was spent counseling and coordinating care related to the above assessment and plan.  Signed by: Florentina Jenny, PA-C Palliative Medicine Pager: [phone]  Please contact Palliative Medicine Team phone at [phone] for questions and concerns.  For individual provider: See Shea Evans

## 2018-06-15 ENCOUNTER — Inpatient Hospital Stay (HOSPITAL_COMMUNITY): Payer: BLUE CROSS/BLUE SHIELD

## 2018-06-15 MED ORDER — GLYCOPYRROLATE 1 MG PO TABS: 1.0000 mg | ORAL_TABLET | ORAL | Status: DC | PRN

## 2018-06-15 MED ORDER — LORAZEPAM 2 MG/ML IJ SOLN: 2.0000 mg | INTRAMUSCULAR | Status: DC

## 2018-06-15 MED ORDER — BIOTENE DRY MOUTH MT LIQD: 15.0000 mL | OROMUCOSAL | Status: DC | PRN

## 2018-06-15 MED ORDER — LORAZEPAM 2 MG/ML IJ SOLN
1.0000 mg | INTRAMUSCULAR | Status: DC | PRN
  Administered 2018-06-15 (×2): 2 mg via INTRAVENOUS
  Filled 2018-06-15: qty 1

## 2018-06-15 MED ORDER — GLYCOPYRROLATE 0.2 MG/ML IJ SOLN: 0.2000 mg | INTRAMUSCULAR | Status: DC | PRN

## 2018-06-15 MED ORDER — HYDROMORPHONE HCL 1 MG/ML IJ SOLN
1.0000 mg | INTRAMUSCULAR | Status: DC
  Administered 2018-06-15: 1 mg via INTRAVENOUS

## 2018-06-15 MED ORDER — LORAZEPAM 2 MG/ML IJ SOLN
0.5000 mg | INTRAMUSCULAR | Status: DC | PRN
  Administered 2018-06-15: 0.5 mg via INTRAVENOUS
  Filled 2018-06-15 (×2): qty 1

## 2018-06-15 MED ORDER — SODIUM CHLORIDE 0.9 % IV SOLN
2.0000 mg/h | INTRAVENOUS | Status: DC
  Administered 2018-06-15: 2 mg/h via INTRAVENOUS
  Filled 2018-06-15 (×2): qty 2.5

## 2018-06-15 MED ORDER — POLYVINYL ALCOHOL 1.4 % OP SOLN
1.0000 [drp] | Freq: Four times a day (QID) | OPHTHALMIC | Status: DC | PRN
  Filled 2018-06-15: qty 15

## 2018-06-15 MED ORDER — LORAZEPAM 2 MG/ML IJ SOLN: 2.0000 mg | INTRAMUSCULAR | Status: DC | PRN

## 2018-06-15 MED ORDER — LORAZEPAM 2 MG/ML IJ SOLN: 1.0000 mg | INTRAMUSCULAR | Status: DC

## 2018-06-15 MED ORDER — HYDROMORPHONE BOLUS VIA INFUSION
2.0000 mg | INTRAVENOUS | Status: DC | PRN
  Filled 2018-06-15: qty 2

## 2018-06-15 MED ORDER — HYDROMORPHONE HCL 1 MG/ML IJ SOLN
1.0000 mg | INTRAMUSCULAR | Status: DC | PRN
  Administered 2018-06-15: 2 mg via INTRAVENOUS
  Filled 2018-06-15: qty 2

## 2018-06-15 MED ORDER — SENNOSIDES-DOCUSATE SODIUM 8.6-50 MG PO TABS: 2.0000 | ORAL_TABLET | Freq: Every day | ORAL | Status: DC

## 2018-06-15 MED ORDER — HYDROMORPHONE HCL 1 MG/ML IJ SOLN
1.0000 mg | INTRAMUSCULAR | Status: DC | PRN
  Administered 2018-06-15: 1 mg via INTRAVENOUS
  Filled 2018-06-15: qty 1
  Filled 2018-06-15: qty 2

## 2018-06-16 ENCOUNTER — Telehealth: Payer: Self-pay | Admitting: *Deleted

## 2018-06-16 NOTE — Telephone Encounter (Signed)
Received D/C from [facility]-D/C forwarded to [facility] to be signed.

## 2018-06-18 ENCOUNTER — Encounter: Payer: BLUE CROSS/BLUE SHIELD | Admitting: Thoracic Surgery (Cardiothoracic Vascular Surgery)

## 2018-06-18 ENCOUNTER — Other Ambulatory Visit: Payer: Self-pay

## 2018-06-18 NOTE — Telephone Encounter (Signed)
Received original signed D/C-D/C mailed to [facility] along with a faxing a copy to funeral home as requested.

## 2018-06-20 ENCOUNTER — Other Ambulatory Visit: Payer: BLUE CROSS/BLUE SHIELD

## 2018-06-22 NOTE — Progress Notes (Signed)
   06-19-18 1200  Clinical Encounter Type  Visited With Patient and family together  Responded to page from nurse that patient is actively dying. Read sacred text, prayed and played spiritual song. Family is at bedside proving support to loved one and themeselves. Provided spiritual and emotional support.

## 2018-06-22 NOTE — Progress Notes (Signed)
Waited 6 ml of dilaudid with Maya,RN.

## 2018-06-22 NOTE — Death Summary Note (Signed)
DEATH SUMMARY   Patient Details  Name: Caleb Harper MRN: [id number] DOB: Dec 25, 1960  Admission/Discharge Information   Admit Date:  06/14/2018  Date of Death: Date of Death: June 19, 2018  Time of Death: Time of Death: 06-Jul-1255  Length of Stay: 04-Jul-2022  Referring Physician: [name], L.Marlou Sa, MD   Reason(s) for Hospitalization  Dyspnea  Diagnoses  Preliminary cause of death:   Lung adenocarcinoma Secondary Diagnoses (including complications and co-morbidities):  Principal Problem:   Acute hypoxemic respiratory failure ([place]) Active Problems:   Interstitial lung disease ([place])   Malignant neoplasm of lung ([facility])   Goals of care, counseling/discussion   Chest tube in place   Palliative care encounter   Comfort measures only status   Brief Hospital Course (including significant findings, care, treatment, and services provided and events leading to death)  Caleb Harper is a 58 y.o. year old male who was admitted for shortness of breath after being followed by the interstitial lung disease program with [facility].  In the weeks leading up to his hospitalization he had worsening dyspnea and hypoxemia and cough.  On admission he was noted to have rapid progression of his bilateral lower lobe infiltrates.  He underwent a VATS biopsy which showed lung adenocarcinoma.  We discussed his case with medical oncology who reviewed his pathology which showed complete obliteration of lung architecture with diffuse alveolar damage related to his malignancy.  By this point post operatively he required high flow O2 to maintain a normal O2 saturation and his dyspnea was progressing rapidly.  It was felt that he would not benefit from cancer treatment so we consulted palliative care.  Comfort measures were initiated and he died peacefully with his family at the bedside on 06/19/2022.      Pertinent Labs and Studies  Significant Diagnostic Studies Dg Chest 1 View  Result Date: 05/22/2018 CLINICAL DATA:  Follow-up  bronchoscopy. EXAM: CHEST  1 VIEW COMPARISON:  05/12/2018 chest CT FINDINGS: No pneumothorax, pleural fluid, or acute opacification. There is left more than right airspace disease and architectural distortion. Normal heart size accounting for technique. IMPRESSION: No acute finding after bronchoscopy. Electronically Signed   By: Monte Fantasia M.D.   On: 05/22/2018 10:22   Ct Chest Wo Contrast  Result Date: 10-Jun-2018 CLINICAL DATA:  Worsening shortness of breath and oxygen requirement. History of emphysema and pulmonary embolism. Recent bronchoscopy showing no malignancy but mild cellular atypia. EXAM: CT CHEST WITHOUT CONTRAST TECHNIQUE: Multidetector CT imaging of the chest was performed following the standard protocol without IV contrast. COMPARISON:  Radiographs 06/12/2018 and 05/22/2018. CT 05/12/2018 and 03/18/2018. FINDINGS: Cardiovascular: Mild atherosclerosis of the aorta and great vessels again noted. No acute vascular findings on noncontrast imaging. The heart size is normal. There is no pericardial effusion. Mediastinum/Nodes: There are no enlarged mediastinal, hilar or axillary lymph nodes.Hilar assessment is limited by the lack of intravenous contrast, although the hilar contours appear unchanged. The thyroid gland, trachea and esophagus demonstrate no significant findings. Lungs/Pleura: There are trace dependent pleural effusions bilaterally which are stable. No pneumothorax. Severe centrilobular emphysema again noted. Superimposed bilateral airspace opacities show further worsening. In both lower lobes, there is consolidation with volume loss and bronchiectasis. There is worsening involvement in the right middle and left upper lobes with areas of patchy and bandlike consolidation. No dominant lung mass or endobronchial lesion identified. Upper abdomen: The visualized upper abdomen appears stable without acute findings. Musculoskeletal/Chest wall: There is no chest wall mass or suspicious  osseous finding. IMPRESSION:  1. Further worsening of extensive bilateral airspace opacities superimposed on emphysema and lower lobe bronchiectasis. Again, findings are most likely inflammatory in etiology, possibly due to chronic aspiration or organizing pneumonia. 2. No dominant mass or adenopathy. 3. Stable small bilateral pleural effusions. 4. Aortic Atherosclerosis (ICD10-I70.0) and Emphysema (ICD10-J43.9). Electronically Signed   By: Richardean Sale M.D.   On: 06/05/2018 20:42   Dg Chest Port 1 View  Result Date: 22-Jun-2018 CLINICAL DATA:  58 year old male with respiratory distress. EXAM: PORTABLE CHEST 1 VIEW COMPARISON:  Chest radiograph dated 06/14/2018 FINDINGS: Left-sided chest tube in similar position. There is a small residual left apical pneumothorax similar to prior radiograph. Bilateral mid to lower lung field airspace opacities as well as pleural effusions similar to prior radiograph. Stable cardiac silhouette. No acute osseous pathology. Left chest wall soft tissue emphysema. IMPRESSION: No significant interval change. Small residual left apical pneumothorax. Electronically Signed   By: Anner Crete M.D.   On: 06/22/18 05:55   Dg Chest Port 1 View  Result Date: 06/14/2018 CLINICAL DATA:  Acute respiratory failure, hypoxia EXAM: PORTABLE CHEST 1 VIEW COMPARISON:  06/14/2018 FINDINGS: Left chest tube remains in place, unchanged. Left apical and lateral pneumothorax again noted, stable. Subcutaneous emphysema within the left chest wall slightly increased. Bilateral mid and lower lung airspace opacities with small effusions, unchanged. IMPRESSION: Stable exam with bilateral airspace disease and small effusions. Stable small left pneumothorax with left chest tube in stable position. Electronically Signed   By: Rolm Baptise M.D.   On: 06/14/2018 16:44   Dg Chest Port 1 View  Result Date: 06/14/2018 CLINICAL DATA:  Follow-up chest surgery EXAM: PORTABLE CHEST 1 VIEW COMPARISON:   06/13/2018 FINDINGS: Left chest tube remains in place extending into the apex. Left pneumothorax in the apex, laterally, and inferior to the lung is unchanged. No midline shift Moderate bibasilar airspace disease unchanged. Small right effusion unchanged. IMPRESSION: Mild  left pneumothorax stable Moderate bibasilar airspace disease and small right pleural effusion stable Electronically Signed   By: Franchot Gallo M.D.   On: 06/14/2018 08:11   Dg Chest Port 1 View  Result Date: 06/13/2018 CLINICAL DATA:  Chest tube EXAM: PORTABLE CHEST 1 VIEW COMPARISON:  Yesterday FINDINGS: Small left pneumothorax with increased gas at the base. Unchanged diffuse interstitial and airspace opacity. Small right pleural effusion, stable. Normal heart size. IMPRESSION: 1. Small left pneumothorax with mild increase at the base. 2. Unchanged interstitial and airspace opacity. Electronically Signed   By: Monte Fantasia M.D.   On: 06/13/2018 05:55   Dg Chest Port 1 View  Result Date: 06/12/2018 CLINICAL DATA:  Chest tube in place. EXAM: PORTABLE CHEST 1 VIEW COMPARISON:  06/11/2018. FINDINGS: LEFT chest tube good position. Unchanged small LEFT pneumothorax, with lateral loculation. BILATERAL pulmonary opacities LEFT greater than RIGHT appear increased. Stable cardiomediastinal silhouette. IMPRESSION: 1. Stable small LEFT pneumothorax. LEFT chest tube good position. 2. Worsening aeration. Electronically Signed   By: Staci Righter M.D.   On: 06/12/2018 07:04   Dg Chest Port 1 View  Result Date: 06/11/2018 CLINICAL DATA:  Chest tube EXAM: PORTABLE CHEST 1 VIEW COMPARISON:  Yesterday FINDINGS: Interstitial coarsening and airspace opacity at the bases. Small apical and lateral left pneumothorax without convincing change. Stable apical chest tube positioning. Chest wall emphysema is stable to decreased. Normal heart size. Background COPD/emphysema. IMPRESSION: 1. Unchanged small left pneumothorax. 2. Unchanged airspace disease  superimposed on chronic lung disease. Electronically Signed   By: Neva Seat.D.  On: 06/11/2018 07:29   Dg Chest Port 1 View  Result Date: 05/26/2018 CLINICAL DATA:  Status post left VATS with open lung biopsy. EXAM: PORTABLE CHEST 1 VIEW COMPARISON:  06/09/2018 FINDINGS: Left chest tube present with small lateral and basilar pneumothorax after surgery. Severe underlying fibrotic lung disease again noted. No overt pulmonary edema or significant pleural effusions. The heart size is normal. IMPRESSION: Small postoperative left-sided lateral and basilar pneumothorax. Single left chest tube is present. Electronically Signed   By: Aletta Edouard M.D.   On: 05/30/2018 17:02   Dg Chest Port 1 View  Result Date: 06/09/2018 CLINICAL DATA:  Acute hypoxic respiratory failure. EXAM: PORTABLE CHEST 1 VIEW COMPARISON:  06/05/2018 FINDINGS: No significant change in the parenchymal densities in the mid and lower left lung. Persistent interstitial densities along the right lower lung and periphery of the right lower chest. Cannot exclude a small right pleural effusion. Heart size is grossly stable. Underlying emphysema. Trachea is midline. IMPRESSION: Emphysema with stable parenchymal disease in both lungs, left side greater than right. Findings are suggestive for pneumonia. No significant change from the recent comparison examination. Possible small right pleural effusion. Electronically Signed   By: Markus Daft M.D.   On: 06/09/2018 08:25   Dg Chest Port 1 View  Result Date: 05/29/2018 CLINICAL DATA:  Cough EXAM: PORTABLE CHEST 1 VIEW COMPARISON:  05/22/2018 FINDINGS: No significant interval change in dense heterogeneous airspace opacity of the left mid and lower lung seen on prior radiographs and CT. Underlying emphysema. No new airspace opacity. IMPRESSION: No significant interval change in dense heterogeneous airspace opacity of the left mid and lower lung seen on prior radiographs and CT. Underlying  emphysema. No new airspace opacity. Electronically Signed   By: Eddie Candle M.D.   On: 05/26/2018 10:50   Dg C-arm Bronchoscopy  Result Date: 05/22/2018 C-ARM BRONCHOSCOPY: Fluoroscopy was utilized by the requesting physician.  No radiographic interpretation.    Microbiology Recent Results (from the past 240 hour(s))  Blood Culture (routine x 2)     Status: None   Collection Time: 06/17/2018 10:50 AM  Result Value Ref Range Status   Specimen Description BLOOD RIGHT HAND  Final   Special Requests   Final    BOTTLES DRAWN AEROBIC AND ANAEROBIC Blood Culture results may not be optimal due to an inadequate volume of blood received in culture bottles   Culture   Final    NO GROWTH 5 DAYS Performed at [facility], [place] [address], [place], [place] [address]    Report Status 06/11/2018 FINAL  Final  Blood Culture (routine x 2)     Status: None   Collection Time: 05/26/2018 11:02 AM  Result Value Ref Range Status   Specimen Description BLOOD RIGHT ANTECUBITAL  Final   Special Requests   Final    BOTTLES DRAWN AEROBIC AND ANAEROBIC Blood Culture adequate volume   Culture   Final    NO GROWTH 5 DAYS Performed at [facility], [place] [address], [place], [place] [address]    Report Status 06/11/2018 FINAL  Final  MRSA PCR Screening     Status: None   Collection Time: 06/13/2018  6:23 PM  Result Value Ref Range Status   MRSA by PCR NEGATIVE NEGATIVE Final    Comment:        The GeneXpert MRSA Assay (FDA approved for NASAL specimens only), is one component of a comprehensive MRSA colonization surveillance program. It is not intended to  diagnose MRSA infection nor to guide or monitor treatment for MRSA infections. Performed at [facility], [place] [address], [place], [place] [address]   Respiratory Panel by PCR     Status: None   Collection Time: 06/09/18  4:39 PM  Result Value Ref Range Status   Adenovirus NOT DETECTED NOT DETECTED Final   Coronavirus 229E NOT DETECTED  NOT DETECTED Final    Comment: (NOTE) The Coronavirus on the Respiratory Panel, DOES NOT test for the novel  Coronavirus (2019 nCoV)    Coronavirus HKU1 NOT DETECTED NOT DETECTED Final   Coronavirus NL63 NOT DETECTED NOT DETECTED Final   Coronavirus OC43 NOT DETECTED NOT DETECTED Final   Metapneumovirus NOT DETECTED NOT DETECTED Final   Rhinovirus / Enterovirus NOT DETECTED NOT DETECTED Final   Influenza A NOT DETECTED NOT DETECTED Final   Influenza B NOT DETECTED NOT DETECTED Final   Parainfluenza Virus 1 NOT DETECTED NOT DETECTED Final   Parainfluenza Virus 2 NOT DETECTED NOT DETECTED Final   Parainfluenza Virus 3 NOT DETECTED NOT DETECTED Final   Parainfluenza Virus 4 NOT DETECTED NOT DETECTED Final   Respiratory Syncytial Virus NOT DETECTED NOT DETECTED Final   Bordetella pertussis NOT DETECTED NOT DETECTED Final   Chlamydophila pneumoniae NOT DETECTED NOT DETECTED Final   Mycoplasma pneumoniae NOT DETECTED NOT DETECTED Final    Comment: Performed at [facility], [place]. [address] [place], [place] [address]  Surgical pcr screen     Status: None   Collection Time: 06/09/18  6:20 PM  Result Value Ref Range Status   MRSA, PCR NEGATIVE NEGATIVE Final   Staphylococcus aureus NEGATIVE NEGATIVE Final    Comment: (NOTE) The Xpert SA Assay (FDA approved for NASAL specimens in patients 33 years of age and older), is one component of a comprehensive surveillance program. It is not intended to diagnose infection nor to guide or monitor treatment. Performed at [facility], [place] [address], [place], [place] [address]     Lab Basic Metabolic Panel: Recent Labs  Lab 06/09/18 0259 06/09/2018 0243 06/07/2018 1724 06/11/18 0358 06/12/18 0657  NA 138 139 138 137 138  K 4.0 4.0 4.0 3.9 4.4  CL 107 106  --  105 103  CO2 22 25  --  26 24  GLUCOSE 131* 122*  --  127* 125*  BUN 19 17  --  18 17  CREATININE 0.84 0.81  --  0.74 0.71  CALCIUM 8.4* 8.3*  --  8.3* 8.5*   Liver  Function Tests: Recent Labs  Lab 06/09/18 0259 06/12/18 0657  AST 15 15  ALT 12 10  ALKPHOS 54 46  BILITOT 0.4 0.6  PROT 5.2* 5.1*  ALBUMIN 2.2* 2.0*   No results for input(s): LIPASE, AMYLASE in the last 168 hours. No results for input(s): AMMONIA in the last 168 hours. CBC: Recent Labs  Lab 06/09/18 0259 05/26/2018 0243 06/16/2018 1724 06/11/18 0358 06/12/18 0657  WBC 22.5* 19.3*  --  13.4* 13.0*  HGB 10.9* 10.7* 10.5* 11.3* 11.9*  HCT 33.1* 32.4* 31.0* 35.5* 36.2*  MCV 91.2 91.3  --  92.4 91.0  PLT 293 286  --  284 240   Cardiac Enzymes: Recent Labs  Lab 06/14/18 1606  TROPONINI 0.04*   Sepsis Labs: Recent Labs  Lab 06/09/18 0259 06/05/2018 0243 06/11/18 0358 06/12/18 0657  WBC 22.5* 19.3* 13.4* 13.0*    Procedures/Operations  VATS biopsy of left lung Chest tube post surgery  Simonne Maffucci 06/27/2018, 2:22 PM

## 2018-06-22 NOTE — Progress Notes (Signed)
Palliative:  HPI: 58 yo male with h/o tobacco use, COPD, back pain admitted 06/13/2018 with acute on chronic respiratory failure. VATS biopsy 05/26/2018 revealing adenocarcinoma. He has chest tube for pneumothorax and has remained with acute respiratory failure that is not thought to improve. Likely hospital death.   Attempted to discuss goals and opioid infusion but Jahki is unable to make this decision. He struggles to stay awake for conversation. Will speak further with his family today.   I arrived to Caleb Harper's bedside with his brother, sister, nieces, nephews. Spoke further with brother and sister regarding Manasseh's decline and how to best manage his comfort. We discussed the face tent as no longer beneficial and that Mate decided this morning he does no longer desire face tent. I recommended comfort to be managed with medications. As we were speaking we were called into Meer's room and he is acutely distressed. After multiple boluses of dilaudid and ativan he began to calm. I ordered dilaudid infusion to better manage symptoms and provide more consistent and more immediate relief. Chaplain called to bedside for support.   Time of death 49 asystole and verified by 2 RNs. Emotional support provided to family. Chaplain paged for continued support at family request.   Exam: Alert. Severe distress with accessory muscle use, tachypnea. Sats 70s with HFNC 50L + face tent. Difficult for him to stay awake and to converse his wishes given his respiratory distress.   Plan: - D/C morphine and OxyIR. Minimize pills as high aspiration risk and likely to add more discomfort. Continue po intake as desired for comfort.  - Dilaudid 1 mg IV every 4 hours scheduled and 1-2 mg IV every hour prn pain or dyspnea/SOB. Patient was transitioned to dilaudid infusion 2 mg/hr with 2 mg bolus every 15 min prn pain/SOB.  - Ativan 2 mg IV every 4 hours scheduled and 2 mg every 1 hour prn anxiety.   [id number] 90 min  Vinie Sill, NP Palliative Medicine Team Pager # [phone] (M-F 8a-5p) Team Phone # [phone] (Nights/Weekends)

## 2018-06-22 NOTE — Progress Notes (Signed)
[facility]  Chart reviewed, discussed with palliative care team.  He is now actively dying, on dilaudid infusion, comfortable.  Appreciate palliative care involvement.   Roselie Awkward, MD [facility] Pager: [phone] Cell: [phone] If no response, call [phone]

## 2018-06-22 DEATH — deceased

## 2018-06-23 ENCOUNTER — Ambulatory Visit: Payer: BLUE CROSS/BLUE SHIELD | Admitting: Nurse Practitioner

## 2018-06-25 ENCOUNTER — Ambulatory Visit: Payer: Self-pay | Admitting: Cardiology

## 2018-06-25 LAB — FUNGUS CULTURE WITH STAIN

## 2018-06-25 LAB — FUNGAL ORGANISM REFLEX

## 2018-06-25 LAB — FUNGUS CULTURE RESULT

## 2018-06-30 ENCOUNTER — Ambulatory Visit: Payer: BLUE CROSS/BLUE SHIELD | Admitting: Pulmonary Disease

## 2018-07-02 ENCOUNTER — Ambulatory Visit: Payer: BLUE CROSS/BLUE SHIELD | Admitting: Pulmonary Disease

## 2018-07-06 LAB — ACID FAST CULTURE WITH REFLEXED SENSITIVITIES (MYCOBACTERIA): Acid Fast Culture: NEGATIVE

## 2019-03-13 IMAGING — CR DG CHEST 1V
1 series · 1 of 1 positions shown · non-contrast
Comparison: 05/12/2018 chest CT

CLINICAL DATA: Follow-up bronchoscopy.

EXAM:
CHEST  1 VIEW

[AP]
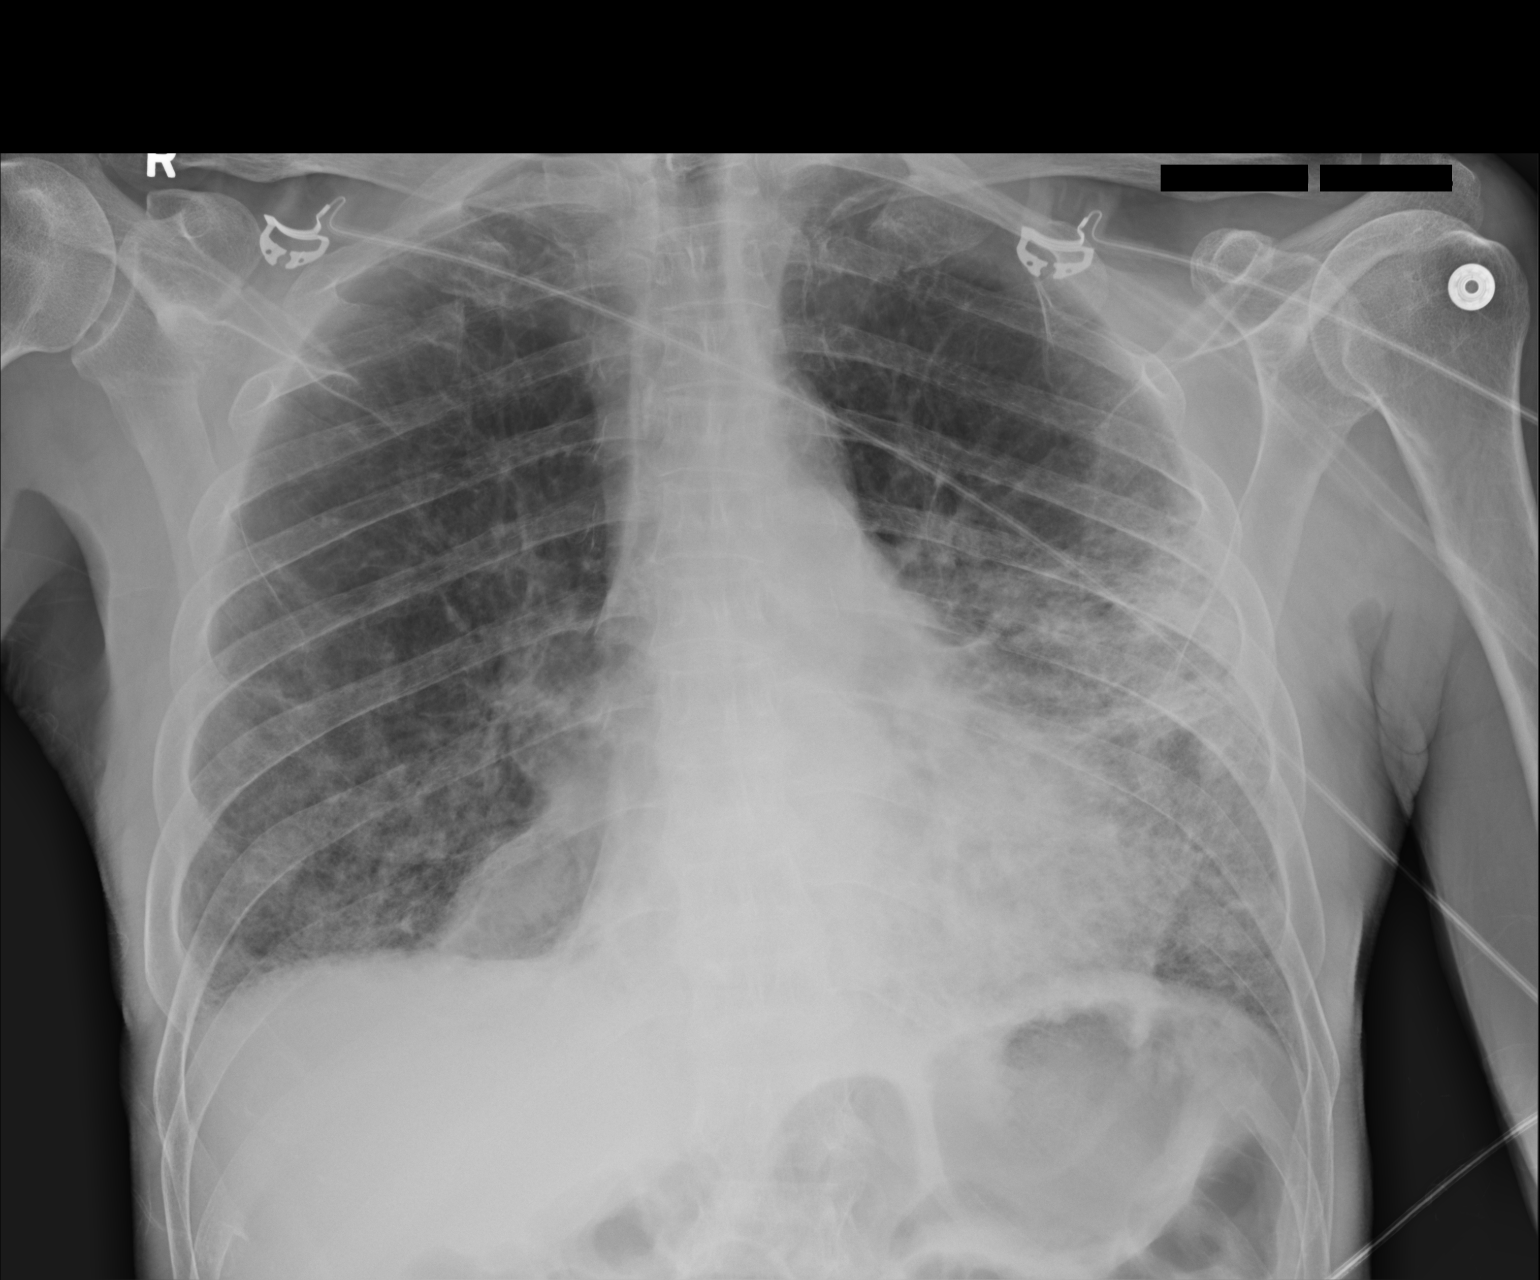

[1 of 1 positions shown; findings below may reference images not displayed]

FINDINGS: No pneumothorax, pleural fluid, or acute opacification. There is
left more than right airspace disease and architectural distortion.
Normal heart size accounting for technique.
IMPRESSION: No acute finding after bronchoscopy.

## 2019-04-02 IMAGING — DX DG CHEST 1V PORT
1 series · 1 of 1 positions shown · non-contrast
Comparison: Yesterday

CLINICAL DATA: Chest tube

EXAM:
PORTABLE CHEST 1 VIEW

[chest ap]
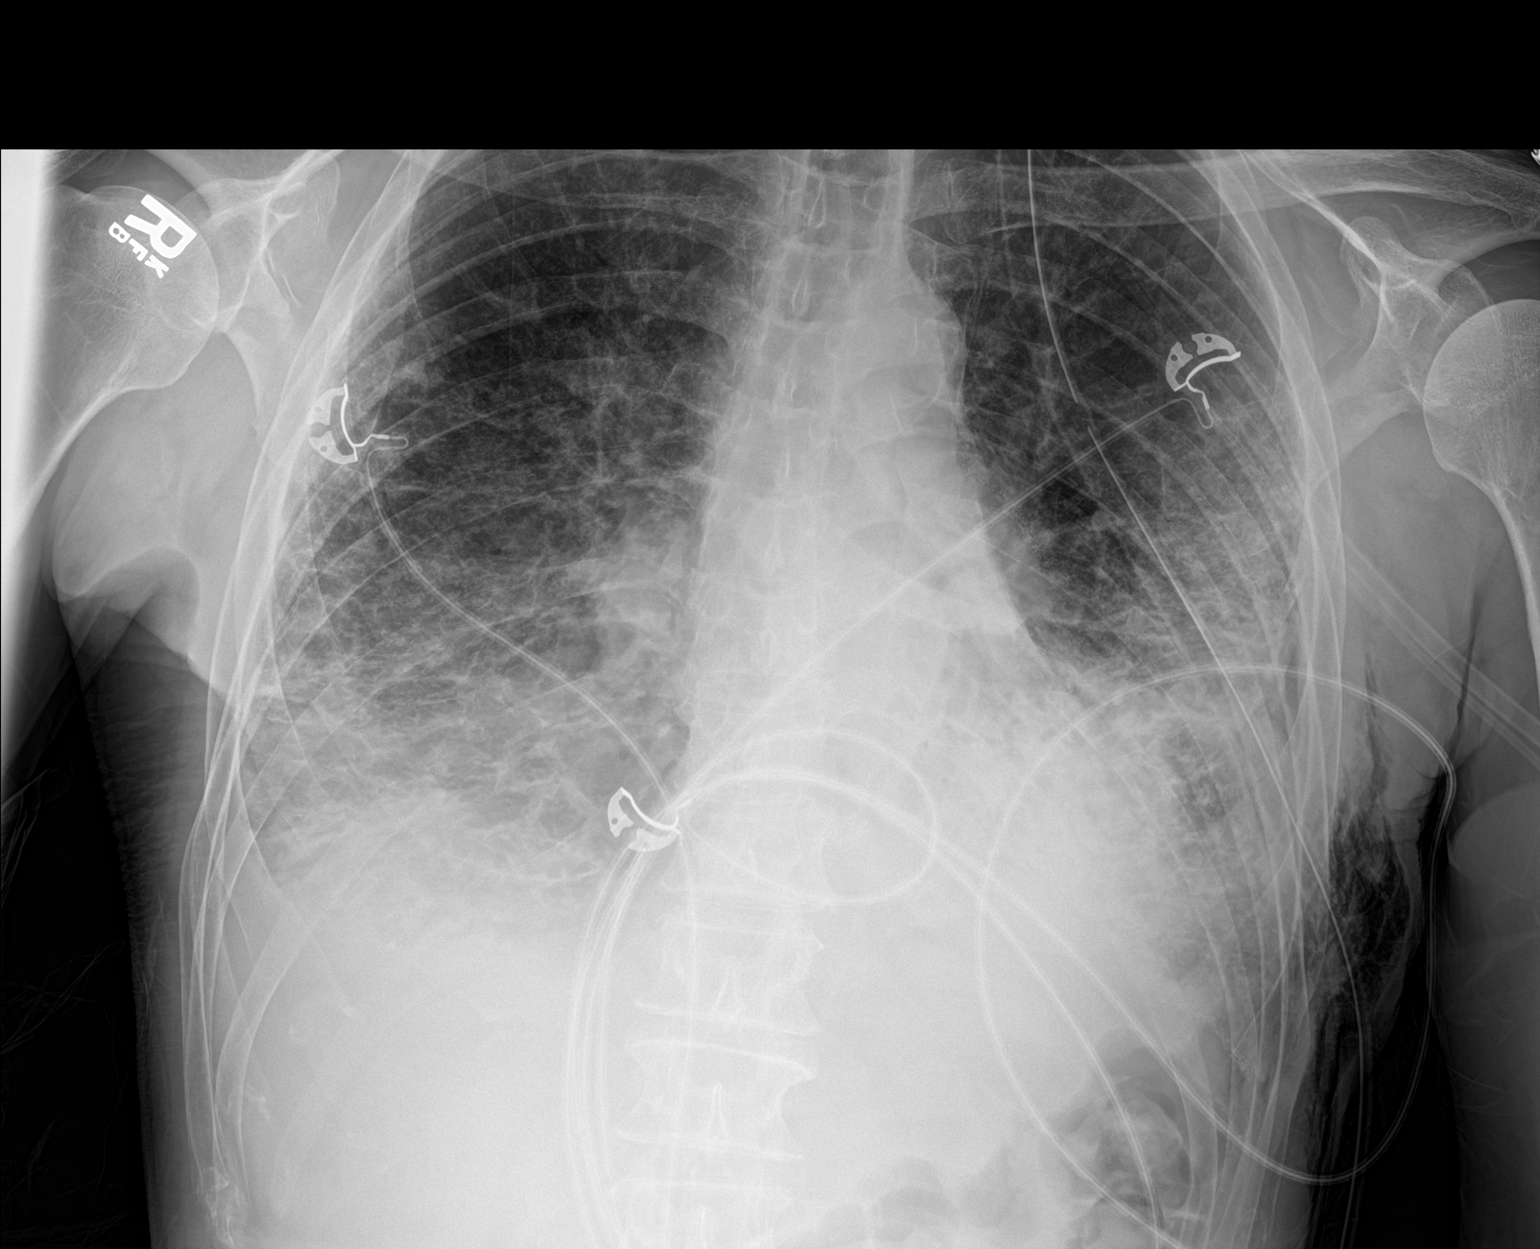

[1 of 1 positions shown; findings below may reference images not displayed]

FINDINGS: Interstitial coarsening and airspace opacity at the bases. Small
apical and lateral left pneumothorax without convincing change.
Stable apical chest tube positioning. Chest wall emphysema is stable
to decreased. Normal heart size. Background COPD/emphysema.
IMPRESSION: 1. Unchanged small left pneumothorax.
2. Unchanged airspace disease superimposed on chronic lung disease.

## 2019-04-04 IMAGING — DX DG CHEST 1V PORT
1 series · 1 of 1 positions shown · non-contrast
Comparison: Yesterday

CLINICAL DATA: Chest tube

EXAM:
PORTABLE CHEST 1 VIEW

[chest]
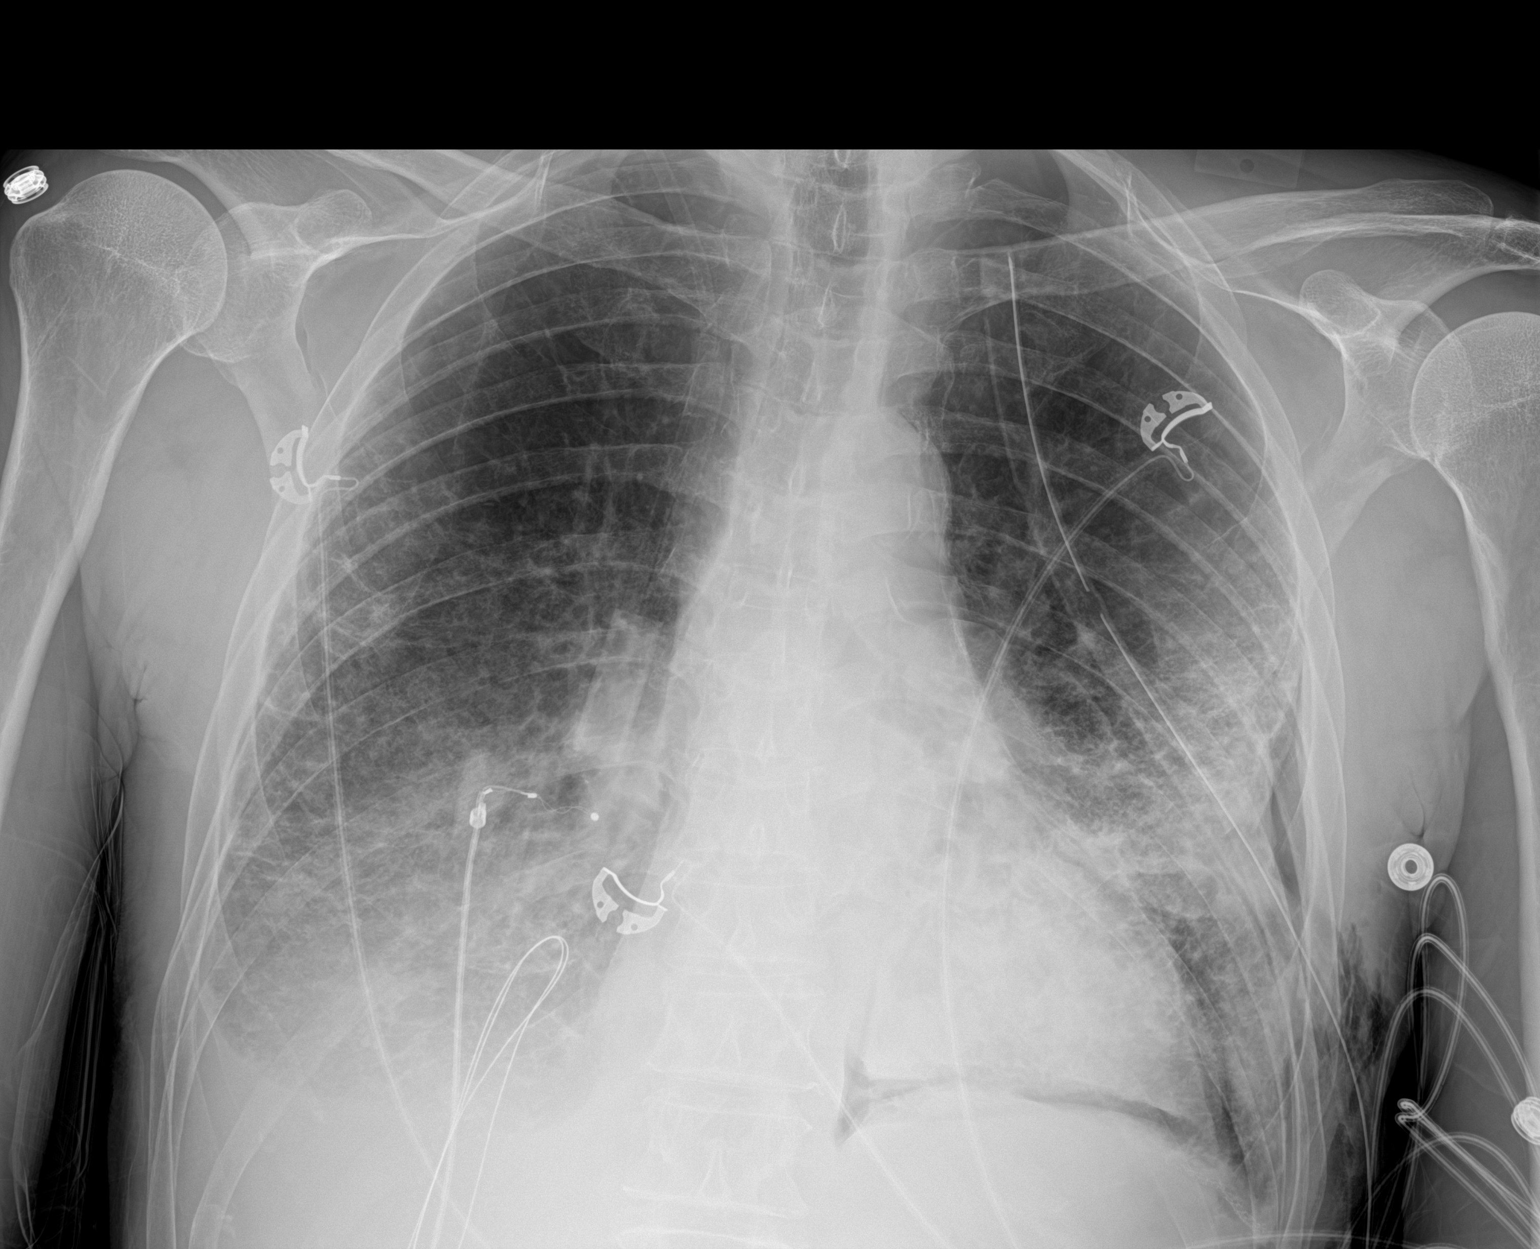

[1 of 1 positions shown; findings below may reference images not displayed]

FINDINGS: Small left pneumothorax with increased gas at the base. Unchanged
diffuse interstitial and airspace opacity. Small right pleural
effusion, stable. Normal heart size.
IMPRESSION: 1. Small left pneumothorax with mild increase at the base.
2. Unchanged interstitial and airspace opacity.

## 2019-04-06 IMAGING — DX DG CHEST 1V PORT
2 series · 2 of 2 positions shown · non-contrast
Comparison: Chest radiograph dated 06/14/2018

CLINICAL DATA: 57-year-old male with respiratory distress.

EXAM:
PORTABLE CHEST 1 VIEW

[chest ap (1 of 2)]
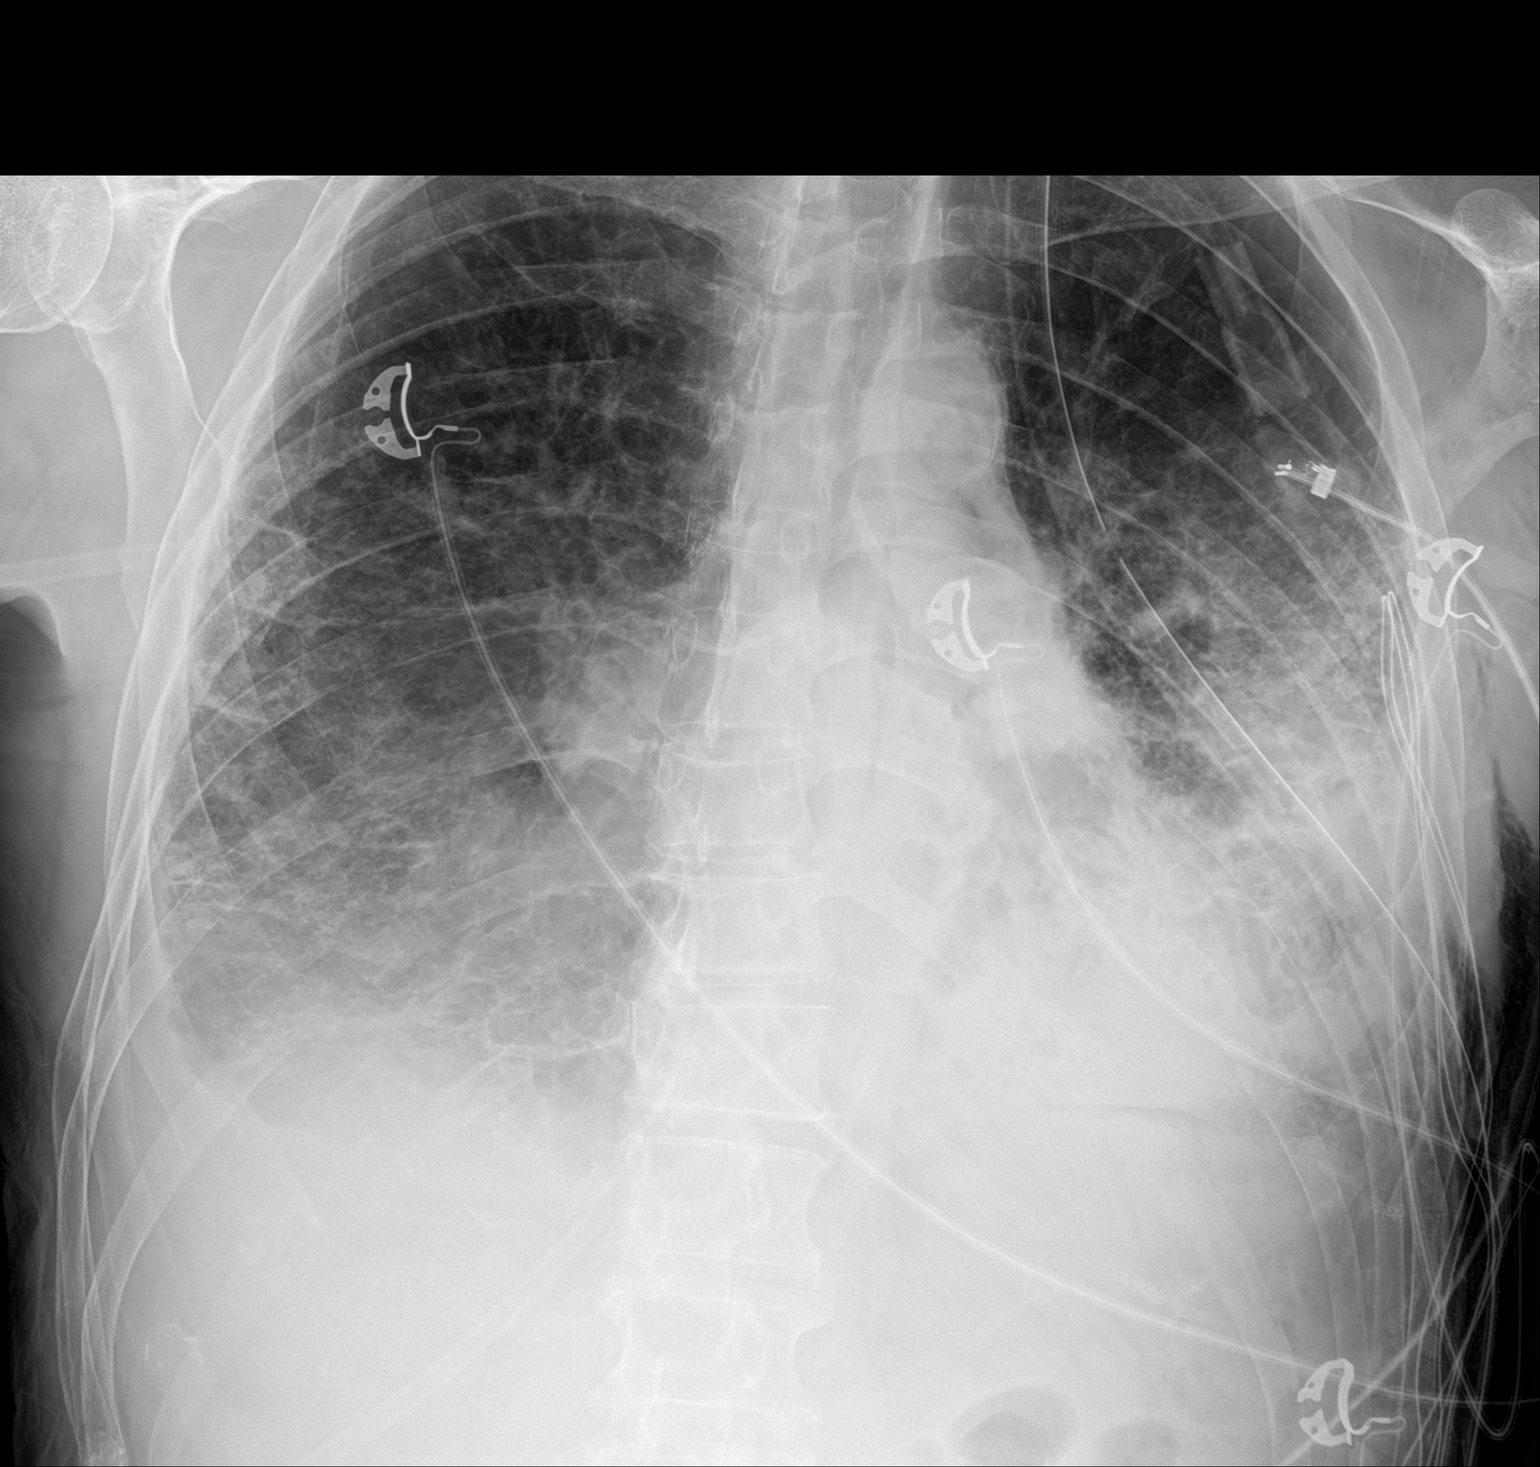

[chest ap (2 of 2)]
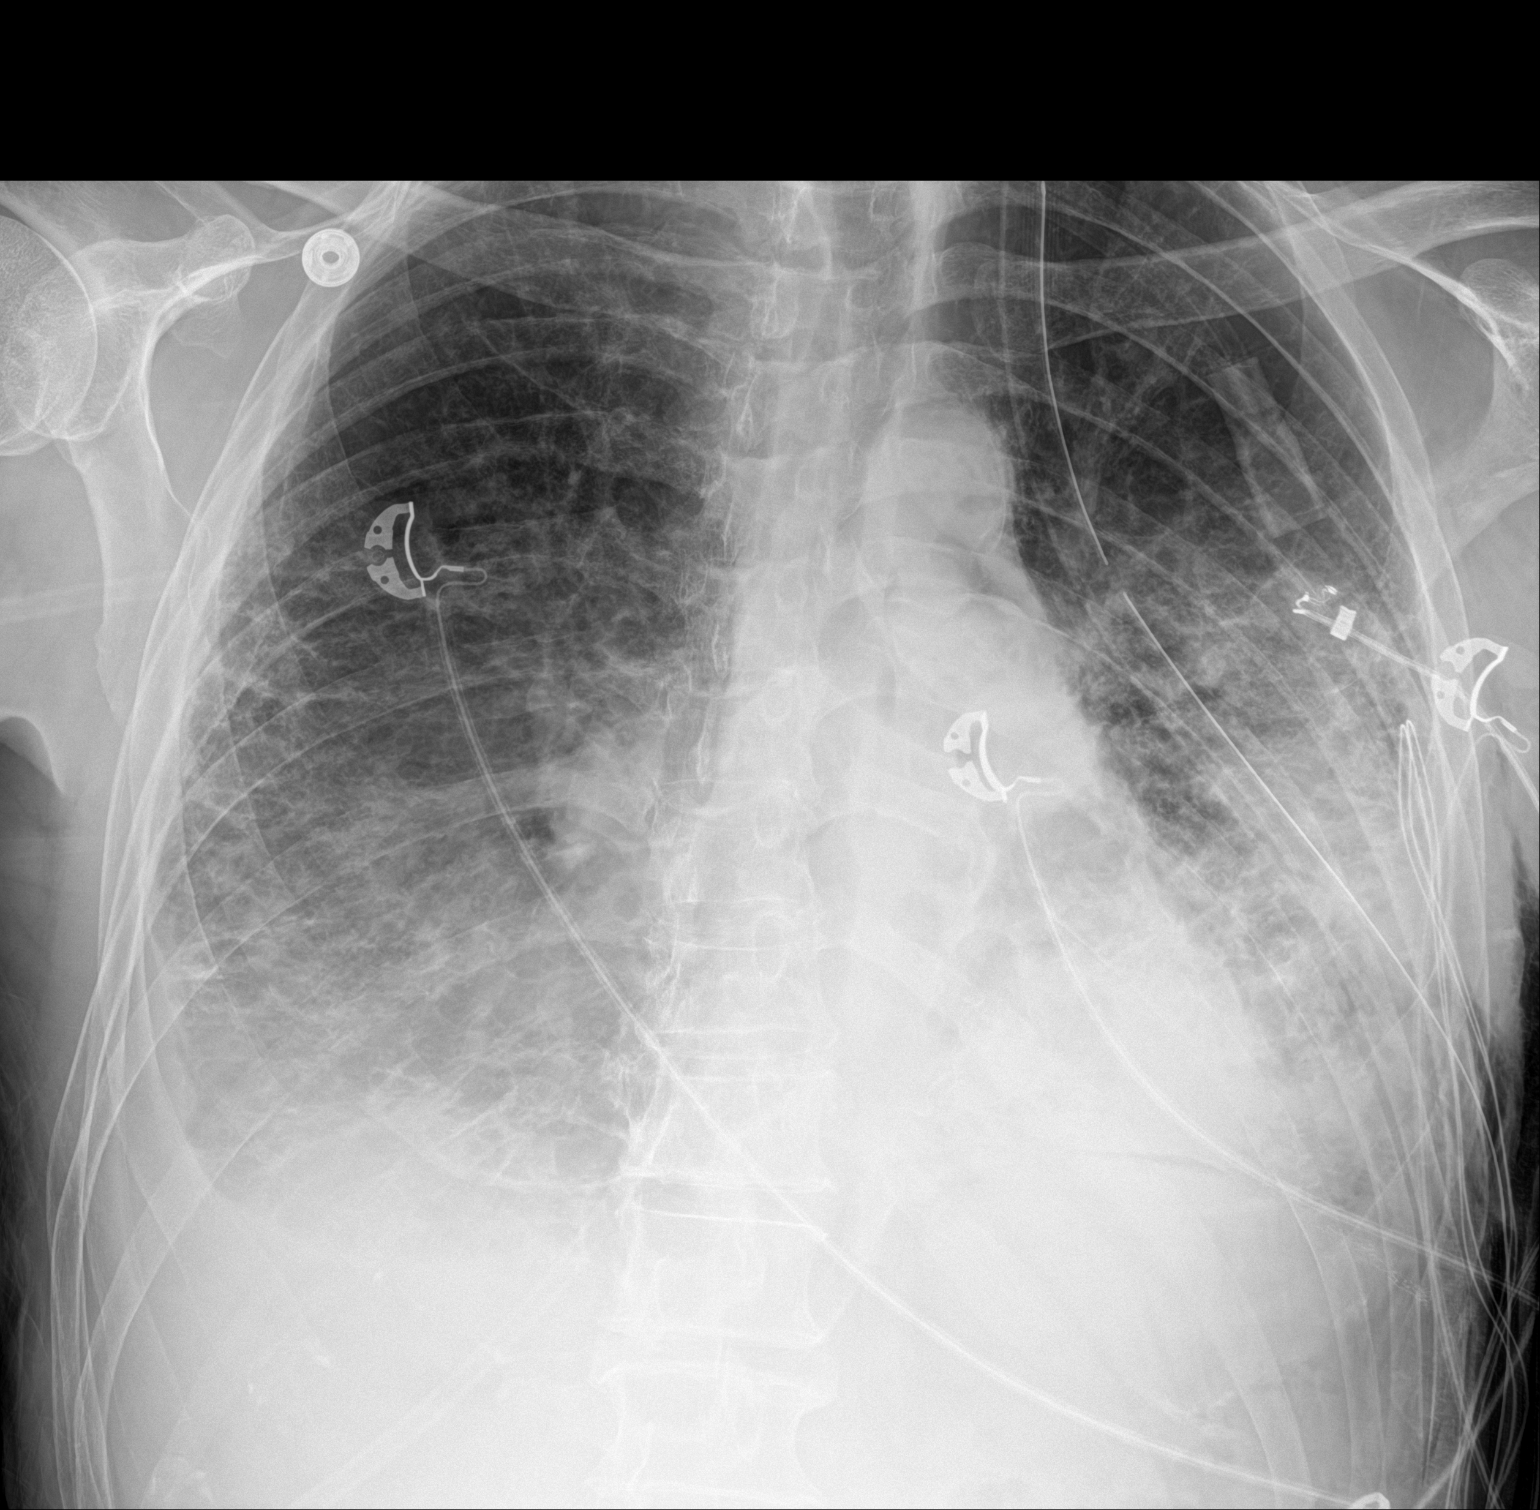

[2 of 2 positions shown; findings below may reference images not displayed]

FINDINGS: Left-sided chest tube in similar position. There is a small residual
left apical pneumothorax similar to prior radiograph. Bilateral mid
to lower lung field airspace opacities as well as pleural effusions
similar to prior radiograph. Stable cardiac silhouette. No acute
osseous pathology. Left chest wall soft tissue emphysema.
IMPRESSION: No significant interval change. Small residual left apical
pneumothorax.
